# Patient Record
Sex: Female | Born: 1935 | Race: White | Hispanic: No | Marital: Married | Smoking: Former smoker
Health system: Southern US, Community
[De-identification: ages and names within clinical notes are randomized; demographics above are authoritative.]

## PROBLEM LIST (undated history)

## (undated) DIAGNOSIS — I10 Essential (primary) hypertension: Secondary | ICD-10-CM

## (undated) DIAGNOSIS — B379 Candidiasis, unspecified: Secondary | ICD-10-CM

## (undated) DIAGNOSIS — I509 Heart failure, unspecified: Secondary | ICD-10-CM

## (undated) DIAGNOSIS — E785 Hyperlipidemia, unspecified: Secondary | ICD-10-CM

## (undated) DIAGNOSIS — N952 Postmenopausal atrophic vaginitis: Secondary | ICD-10-CM

## (undated) DIAGNOSIS — I429 Cardiomyopathy, unspecified: Secondary | ICD-10-CM

## (undated) DIAGNOSIS — N183 Chronic kidney disease, stage 3 unspecified: Secondary | ICD-10-CM

## (undated) DIAGNOSIS — H409 Unspecified glaucoma: Secondary | ICD-10-CM

## (undated) DIAGNOSIS — M1990 Unspecified osteoarthritis, unspecified site: Secondary | ICD-10-CM

## (undated) DIAGNOSIS — E1129 Type 2 diabetes mellitus with other diabetic kidney complication: Secondary | ICD-10-CM

## (undated) DIAGNOSIS — H269 Unspecified cataract: Secondary | ICD-10-CM

## (undated) DIAGNOSIS — I447 Left bundle-branch block, unspecified: Secondary | ICD-10-CM

## (undated) HISTORY — DX: Unspecified osteoarthritis, unspecified site: M19.90

## (undated) HISTORY — DX: Hyperlipidemia, unspecified: E78.5

## (undated) HISTORY — DX: Postmenopausal atrophic vaginitis: N95.2

## (undated) HISTORY — PX: ABDOMINAL HYSTERECTOMY: SHX81

## (undated) HISTORY — DX: Unspecified glaucoma: H40.9

## (undated) HISTORY — DX: Unspecified cataract: H26.9

## (undated) HISTORY — PX: EYE SURGERY: SHX253

## (undated) HISTORY — DX: Candidiasis, unspecified: B37.9

## (undated) HISTORY — PX: OTHER SURGICAL HISTORY: SHX169

## (undated) HISTORY — PX: FINGER SURGERY: SHX640

---
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
## 1976-11-23 HISTORY — PX: TOTAL VAGINAL HYSTERECTOMY: SHX2548

## 1998-01-14 ENCOUNTER — Ambulatory Visit (HOSPITAL_COMMUNITY): Admission: RE | Admit: 1998-01-14 | Discharge: 1998-01-14 | Payer: Self-pay | Admitting: Obstetrics and Gynecology

## 1998-01-18 ENCOUNTER — Ambulatory Visit (HOSPITAL_COMMUNITY): Admission: RE | Admit: 1998-01-18 | Discharge: 1998-01-18 | Payer: Self-pay | Admitting: Obstetrics and Gynecology

## 1999-01-15 ENCOUNTER — Ambulatory Visit (HOSPITAL_COMMUNITY): Admission: RE | Admit: 1999-01-15 | Discharge: 1999-01-15 | Payer: Self-pay | Admitting: Obstetrics and Gynecology

## 1999-01-15 ENCOUNTER — Encounter: Payer: Self-pay | Admitting: Obstetrics and Gynecology

## 1999-02-17 ENCOUNTER — Encounter: Admission: RE | Admit: 1999-02-17 | Discharge: 1999-05-18 | Payer: Self-pay | Admitting: Endocrinology

## 2000-01-22 DIAGNOSIS — E1129 Type 2 diabetes mellitus with other diabetic kidney complication: Secondary | ICD-10-CM

## 2000-01-22 HISTORY — DX: Type 2 diabetes mellitus with other diabetic kidney complication: E11.29

## 2000-03-03 ENCOUNTER — Encounter: Payer: Self-pay | Admitting: Obstetrics and Gynecology

## 2000-03-03 ENCOUNTER — Ambulatory Visit (HOSPITAL_COMMUNITY): Admission: RE | Admit: 2000-03-03 | Discharge: 2000-03-03 | Payer: Self-pay | Admitting: Obstetrics and Gynecology

## 2001-04-07 ENCOUNTER — Other Ambulatory Visit: Admission: RE | Admit: 2001-04-07 | Discharge: 2001-04-07 | Payer: Self-pay | Admitting: Obstetrics and Gynecology

## 2001-04-14 ENCOUNTER — Ambulatory Visit (HOSPITAL_COMMUNITY): Admission: RE | Admit: 2001-04-14 | Discharge: 2001-04-14 | Payer: Self-pay | Admitting: Obstetrics and Gynecology

## 2001-04-14 ENCOUNTER — Encounter: Payer: Self-pay | Admitting: Obstetrics and Gynecology

## 2001-04-21 ENCOUNTER — Encounter: Payer: Self-pay | Admitting: Obstetrics and Gynecology

## 2001-04-21 ENCOUNTER — Encounter: Admission: RE | Admit: 2001-04-21 | Discharge: 2001-04-21 | Payer: Self-pay | Admitting: Obstetrics and Gynecology

## 2002-07-28 ENCOUNTER — Encounter: Admission: RE | Admit: 2002-07-28 | Discharge: 2002-07-28 | Payer: Self-pay | Admitting: Obstetrics and Gynecology

## 2002-07-28 ENCOUNTER — Encounter: Payer: Self-pay | Admitting: Obstetrics and Gynecology

## 2003-05-24 HISTORY — PX: COLONOSCOPY: SHX174

## 2003-06-08 ENCOUNTER — Ambulatory Visit (HOSPITAL_COMMUNITY): Admission: RE | Admit: 2003-06-08 | Discharge: 2003-06-08 | Payer: Self-pay | Admitting: *Deleted

## 2003-10-26 ENCOUNTER — Emergency Department (HOSPITAL_COMMUNITY): Admission: EM | Admit: 2003-10-26 | Discharge: 2003-10-27 | Payer: Self-pay

## 2003-11-09 ENCOUNTER — Encounter: Admission: RE | Admit: 2003-11-09 | Discharge: 2003-11-09 | Payer: Self-pay | Admitting: Obstetrics and Gynecology

## 2004-08-26 ENCOUNTER — Encounter (HOSPITAL_COMMUNITY): Admission: RE | Admit: 2004-08-26 | Discharge: 2004-11-24 | Payer: Self-pay | Admitting: Cardiology

## 2005-01-14 ENCOUNTER — Encounter: Admission: RE | Admit: 2005-01-14 | Discharge: 2005-01-14 | Payer: Self-pay | Admitting: Obstetrics and Gynecology

## 2006-03-02 ENCOUNTER — Other Ambulatory Visit: Admission: RE | Admit: 2006-03-02 | Discharge: 2006-03-02 | Payer: Self-pay | Admitting: Obstetrics & Gynecology

## 2006-03-02 LAB — HM PAP SMEAR: HM Pap smear: NEGATIVE

## 2006-03-03 ENCOUNTER — Encounter: Admission: RE | Admit: 2006-03-03 | Discharge: 2006-03-03 | Payer: Self-pay | Admitting: Obstetrics & Gynecology

## 2007-03-30 ENCOUNTER — Encounter: Admission: RE | Admit: 2007-03-30 | Discharge: 2007-03-30 | Payer: Self-pay | Admitting: Obstetrics & Gynecology

## 2008-01-22 LAB — HM DEXA SCAN

## 2008-04-04 ENCOUNTER — Encounter: Admission: RE | Admit: 2008-04-04 | Discharge: 2008-04-04 | Payer: Self-pay | Admitting: Obstetrics & Gynecology

## 2008-06-23 HISTORY — PX: COLONOSCOPY: SHX174

## 2008-07-13 ENCOUNTER — Ambulatory Visit (HOSPITAL_COMMUNITY): Admission: RE | Admit: 2008-07-13 | Discharge: 2008-07-13 | Payer: Self-pay | Admitting: *Deleted

## 2009-04-10 ENCOUNTER — Encounter: Admission: RE | Admit: 2009-04-10 | Discharge: 2009-04-10 | Payer: Self-pay | Admitting: Internal Medicine

## 2009-11-23 HISTORY — PX: CATARACT EXTRACTION W/ INTRAOCULAR LENS IMPLANT: SHX1309

## 2011-04-02 ENCOUNTER — Other Ambulatory Visit: Payer: Self-pay | Admitting: Internal Medicine

## 2011-04-02 DIAGNOSIS — Z1231 Encounter for screening mammogram for malignant neoplasm of breast: Secondary | ICD-10-CM

## 2011-04-07 NOTE — Op Note (Signed)
[name], Katie Fuentes          ACCOUNT NO.:  [id number]   MEDICAL RECORD NO.:  [id number]          PATIENT TYPE:  AMB   LOCATION:  ENDO                         FACILITY:  [facility]   PHYSICIAN:  Georgiana Spinner, M.D.    DATE OF BIRTH:  [birth year]   DATE OF PROCEDURE:  07/13/2008  DATE OF DISCHARGE:                               OPERATIVE REPORT   PROCEDURE:  Colonoscopy.   INDICATIONS:  Colon cancer screening, colon polyps.   ANESTHESIA:  1. Fentanyl 100 mcg.  2. Versed 10 mg.   PROCEDURE:  With the patient mildly sedated in the left lateral  decubitus position, the Pentax videoscopic colonoscope was inserted in  the rectum and passed under direct vision.  With pressure applied, we  reached the cecum, identified by ileocecal valve and appendiceal  orifice, both of which were photographed.  From this point, the  colonoscope was slowly withdrawn, taking circumferential views of the  colonic mucosa, stopping only in the rectum which appeared normal on  direct and showed hemorrhoids on retroflexed view.  The endoscope was  straightened and withdrawn.  The patient's vital signs and pulse  oximeter remained stable.  The patient tolerated the procedure well,  without apparent complications.   FINDINGS:  Internal hemorrhoids.  Otherwise, unremarkable exam.   PLAN:  Consider repeat examination in 5-10 years.           ______________________________  Georgiana Spinner, M.D.     GMO/MEDQ  D:  07/13/2008  T:  07/13/2008  Job:  [id number]

## 2011-04-08 ENCOUNTER — Ambulatory Visit: Payer: Self-pay

## 2011-04-10 NOTE — Op Note (Signed)
   NAME:  Katie Fuentes, Katie Fuentes                    ACCOUNT NO.:  [id number]   MEDICAL RECORD NO.:  [id number]                   PATIENT TYPE:  AMB   LOCATION:  ENDO                                 FACILITY:  [facility]   PHYSICIAN:  Georgiana Spinner, M.D.                 DATE OF BIRTH:  02-19-1934   DATE OF PROCEDURE:  06/08/2003  DATE OF DISCHARGE:                                 OPERATIVE REPORT   PROCEDURE:  Colonoscopy.   SURGEON:  Georgiana Spinner, M.D.   INDICATIONS FOR PROCEDURE:  Colon cancer screening.   ANESTHESIA:  Demerol 80 mg and Versed 8 mg.   DESCRIPTION OF PROCEDURE:  With the patient mildly sedated in the left  lateral decubitus position, the Olympus videoscopic colonoscope was inserted  in the rectum and passed under direct vision to the cecum identified by the  ileocecal valve and the base of the cecum both of which were photographed  from this point.  The colonoscope was slowly withdrawn taking several views  and the entire colonic mucosa was visualized, stopping only in the rectum  which appeared normal.  The rectum showed hemorrhoids in the retroflex view.  The endoscope was straightened and withdrawn.  The patient's vital signs and  pulse oximetry remained stable.  The patient tolerated the procedure well  without apparent complications.   FINDINGS:  Internal hemorrhoids, otherwise unremarkable colonoscopic  examination to the cecum.   RECOMMENDATIONS:  Repeat examination in five to ten years.                                               Georgiana Spinner, M.D.    GMO/MEDQ  D:  06/08/2003  T:  06/09/2003  Job:  [id number]

## 2011-04-17 ENCOUNTER — Ambulatory Visit
Admission: RE | Admit: 2011-04-17 | Discharge: 2011-04-17 | Disposition: A | Discharge status: Home / Self Care | Payer: Medicare Other | Source: Ambulatory Visit | Attending: Internal Medicine | Admitting: Internal Medicine

## 2011-04-17 DIAGNOSIS — Z1231 Encounter for screening mammogram for malignant neoplasm of breast: Secondary | ICD-10-CM

## 2012-04-11 ENCOUNTER — Other Ambulatory Visit: Payer: Self-pay | Admitting: Internal Medicine

## 2012-04-11 DIAGNOSIS — Z1231 Encounter for screening mammogram for malignant neoplasm of breast: Secondary | ICD-10-CM

## 2012-04-11 DIAGNOSIS — N63 Unspecified lump in unspecified breast: Secondary | ICD-10-CM

## 2012-04-15 ENCOUNTER — Other Ambulatory Visit: Payer: Self-pay | Admitting: Internal Medicine

## 2012-04-15 DIAGNOSIS — N63 Unspecified lump in unspecified breast: Secondary | ICD-10-CM

## 2012-04-20 ENCOUNTER — Ambulatory Visit
Admission: RE | Admit: 2012-04-20 | Discharge: 2012-04-20 | Disposition: A | Discharge status: Home / Self Care | Payer: Medicare Other | Source: Ambulatory Visit | Attending: Internal Medicine | Admitting: Internal Medicine

## 2012-04-20 DIAGNOSIS — N63 Unspecified lump in unspecified breast: Secondary | ICD-10-CM

## 2012-04-26 ENCOUNTER — Other Ambulatory Visit: Payer: Medicare Other

## 2012-04-29 ENCOUNTER — Ambulatory Visit: Payer: Medicare Other

## 2012-09-14 ENCOUNTER — Other Ambulatory Visit: Payer: Self-pay | Admitting: Internal Medicine

## 2012-09-14 DIAGNOSIS — R799 Abnormal finding of blood chemistry, unspecified: Secondary | ICD-10-CM

## 2012-09-21 ENCOUNTER — Ambulatory Visit
Admission: RE | Admit: 2012-09-21 | Discharge: 2012-09-21 | Disposition: A | Discharge status: Home / Self Care | Payer: Medicare Other | Source: Ambulatory Visit | Attending: Internal Medicine | Admitting: Internal Medicine

## 2012-09-21 DIAGNOSIS — R799 Abnormal finding of blood chemistry, unspecified: Secondary | ICD-10-CM

## 2013-05-02 ENCOUNTER — Encounter: Payer: Self-pay | Admitting: *Deleted

## 2013-05-05 ENCOUNTER — Other Ambulatory Visit: Payer: Self-pay

## 2013-05-05 DIAGNOSIS — Z1231 Encounter for screening mammogram for malignant neoplasm of breast: Secondary | ICD-10-CM

## 2013-05-09 ENCOUNTER — Encounter: Payer: Self-pay | Admitting: Nurse Practitioner

## 2013-05-09 ENCOUNTER — Ambulatory Visit (INDEPENDENT_AMBULATORY_CARE_PROVIDER_SITE_OTHER): Payer: Medicare Other | Admitting: Nurse Practitioner

## 2013-05-09 VITALS — BP 132/60 | HR 66 | Resp 14 | Ht 62.5 in | Wt 167.4 lb

## 2013-05-09 DIAGNOSIS — Z01419 Encounter for gynecological examination (general) (routine) without abnormal findings: Secondary | ICD-10-CM

## 2013-05-09 MED ORDER — ESTROGENS, CONJUGATED 0.625 MG/GM VA CREA: TOPICAL_CREAM | VAGINAL | Status: DC

## 2013-05-09 NOTE — Progress Notes (Signed)
79 y.o. G36P0013 Married Caucasian Fe here for annual exam.  She feels well, occasional urinary and stool incontinence whish is a long term problem. She has been told it is related to her med's for diabetes. She does not need to wear a pad daily.  No LMP recorded. Patient has had a hysterectomy.          Sexually active: yes  The current method of family planning is status post hysterectomy.    Exercising: yes  Pt does aerobics.  Smoker:  no  Health Maintenance: Pap:  03/02/2006  negative MMG:  04/20/2012 scheduled 05/2013 Colonoscopy:  06/2008  Dr. Virginia Rochester normal recheck in 5 years BMD:   2013 "normal " per patient TDaP:  2013, per pt. PCP maintains tetanus.  Labs: PCP maintains lab (blood work) and checks urine.    does not have a smoking history on file. She has never used smokeless tobacco. She reports that she does not drink alcohol or use illicit drugs.  Past Medical History  Diagnosis Date  . Atrophic vaginitis   . Yeast infection   . Diabetes mellitus without complication   . Glaucoma   . Cataracts, bilateral     Past Surgical History  Procedure Laterality Date  . Vaginal delivery      x3  . Colonoscopy  05/2003  . Finger surgery    . Abdominal hysterectomy      TVH    Current Outpatient Prescriptions  Medication Sig Dispense Refill  . Ascorbic Acid (VITAMIN C) 1000 MG tablet Take 1,000 mg by mouth daily.      Marland Kitchen aspirin 81 MG tablet Take 81 mg by mouth daily.      . beta carotene 82956 UNIT capsule Take 10,000 Units by mouth daily.      . calcium carbonate (TUMS EX) 750 MG chewable tablet Chew 1 tablet by mouth 2 (two) times daily.      . calcium citrate (CALCITRATE - DOSED IN MG ELEMENTAL CALCIUM) 950 MG tablet Take 1 tablet by mouth daily.      . cholecalciferol (VITAMIN D) 1000 UNITS tablet Take 1,000 Units by mouth daily.      Marland Kitchen conjugated estrogens (PREMARIN) vaginal cream Place vaginally daily.      Marland Kitchen glucosamine-chondroitin 500-400 MG tablet Take 1 tablet by mouth 3  (three) times daily.      Marland Kitchen glyBURIDE (DIABETA) 5 MG tablet Take 5 mg by mouth daily with breakfast.      . L-Methylfolate-B6-B12 (METANX PO) Take by mouth 2 (two) times daily.      . nadolol-bendroflumethiazide (CORZIDE) 40-5 MG per tablet Take 1 tablet by mouth daily.      . rosuvastatin (CRESTOR) 5 MG tablet Take 5 mg by mouth daily.      . sitaGLIPtan-metformin (JANUMET) 50-1000 MG per tablet Take 1 tablet by mouth 2 (two) times daily with a meal.      . telmisartan (MICARDIS) 80 MG tablet Take 80 mg by mouth daily.      . vitamin E 400 UNIT capsule Take 400 Units by mouth daily.      . Travoprost, BAK Free, (TRAVATAN) 0.004 % SOLN ophthalmic solution Place 1 drop into both eyes at bedtime.       No current facility-administered medications for this visit.    History reviewed. No pertinent family history.  ROS:  Pertinent items are noted in HPI.  Otherwise, a comprehensive ROS was negative.  Exam:   BP 132/60  Pulse 66  Resp 14  Ht 5' 2.5" (1.588 m)  Wt 167 lb 6.4 oz (75.932 kg)  BMI 30.11 kg/m2 Height: 5' 2.5" (158.8 cm)  Ht Readings from Last 3 Encounters:  05/09/13 5' 2.5" (1.588 m)    General appearance: alert, cooperative and appears stated age Head: Normocephalic, without obvious abnormality, atraumatic Neck: no adenopathy, supple, symmetrical, trachea midline and thyroid normal to inspection and palpation Lungs: clear to auscultation bilaterally Breasts: normal appearance, no masses or tenderness Heart: regular rate and rhythm Abdomen: soft, non-tender; no masses,  no organomegaly Extremities: extremities normal, atraumatic, no cyanosis or edema Skin: Skin color, texture, turgor normal. No rashes or lesions Lymph nodes: Cervical, supraclavicular, and axillary nodes normal. No abnormal inguinal nodes palpated Neurologic: Grossly normal   Pelvic: External genitalia:  no lesions              Urethra:  normal appearing urethra with no masses, tenderness or lesions               Bartholin's and Skene's: normal                 Vagina: atrophic appearing vagina but improved with pale color and no   discharge, no lesions              Cervix: absent              Pap taken: no Bimanual Exam:  Uterus:  uterus absent              Adnexa: no mass, fullness, tenderness               Rectovaginal: declined per pt request               Anus:  declined per pt request  A:  Well Woman with normal exam  S/P TVH secondary to endometriosis 1978  Atrophic vaginitis  better on E vag cream  History of DM, HTN, hypercholesterolemia  P:   Pap smear as per guidelines   Mammogram as scheduled 7/ 2014  Refilled vag E cream  counseled on osteoporosis, adequate intake of calcium and vitamin D,   diet and exercise, Kegel's exercises return annually or prn  An After Visit Summary was printed and given to the patient.

## 2013-05-09 NOTE — Patient Instructions (Signed)

## 2013-05-11 NOTE — Progress Notes (Signed)
Encounter reviewed by Dr. Brook Silva.  

## 2013-06-08 ENCOUNTER — Ambulatory Visit: Payer: Medicare Other

## 2013-06-28 ENCOUNTER — Other Ambulatory Visit: Payer: Self-pay

## 2013-06-28 ENCOUNTER — Ambulatory Visit
Admission: RE | Admit: 2013-06-28 | Discharge: 2013-06-28 | Disposition: A | Discharge status: Home / Self Care | Payer: Medicare Other | Source: Ambulatory Visit

## 2013-06-28 DIAGNOSIS — Z1231 Encounter for screening mammogram for malignant neoplasm of breast: Secondary | ICD-10-CM

## 2013-09-28 ENCOUNTER — Other Ambulatory Visit: Payer: Self-pay

## 2014-04-02 ENCOUNTER — Encounter: Payer: Self-pay | Admitting: Nurse Practitioner

## 2014-04-23 HISTORY — PX: SQUAMOUS CELL CARCINOMA EXCISION: SHX2433

## 2014-05-11 ENCOUNTER — Ambulatory Visit: Payer: Medicare Other | Admitting: Nurse Practitioner

## 2014-05-24 ENCOUNTER — Ambulatory Visit (INDEPENDENT_AMBULATORY_CARE_PROVIDER_SITE_OTHER): Payer: Medicare Other | Admitting: Nurse Practitioner

## 2014-05-24 ENCOUNTER — Encounter: Payer: Self-pay | Admitting: Nurse Practitioner

## 2014-05-24 VITALS — BP 130/64 | Resp 18 | Ht 62.5 in | Wt 166.0 lb

## 2014-05-24 DIAGNOSIS — E1142 Type 2 diabetes mellitus with diabetic polyneuropathy: Secondary | ICD-10-CM

## 2014-05-24 DIAGNOSIS — E1129 Type 2 diabetes mellitus with other diabetic kidney complication: Secondary | ICD-10-CM | POA: Insufficient documentation

## 2014-05-24 DIAGNOSIS — I10 Essential (primary) hypertension: Secondary | ICD-10-CM | POA: Insufficient documentation

## 2014-05-24 DIAGNOSIS — N952 Postmenopausal atrophic vaginitis: Secondary | ICD-10-CM

## 2014-05-24 DIAGNOSIS — Z01419 Encounter for gynecological examination (general) (routine) without abnormal findings: Secondary | ICD-10-CM

## 2014-05-24 DIAGNOSIS — E0840 Diabetes mellitus due to underlying condition with diabetic neuropathy, unspecified: Secondary | ICD-10-CM

## 2014-05-24 DIAGNOSIS — E1349 Other specified diabetes mellitus with other diabetic neurological complication: Secondary | ICD-10-CM

## 2014-05-24 DIAGNOSIS — E669 Obesity, unspecified: Secondary | ICD-10-CM

## 2014-05-24 MED ORDER — ESTROGENS, CONJUGATED 0.625 MG/GM VA CREA: TOPICAL_CREAM | VAGINAL | Status: DC

## 2014-05-24 NOTE — Progress Notes (Signed)
80 y.o. G12P0013 Married Caucasian Fe here for annual exam.  Last HGB AIC was 7.0,.  She is working on getting that down.  Doing well except 2 UTI in May and June. PCP thought it may be related to using premarin vaginal cream and getting it at the urethra. She was not SA at the last event of infection.   Patient's last menstrual period was 07/24/1977.          Sexually active: Yes.    The current method of family planning is status post hysterectomy.    Exercising: Yes.    aerobics, tai chi chah 3x/wk;4-5/wk Smoker:  no  Health Maintenance: Pap:  03/02/2006 Neg MMG:  06/28/13 Bi-Rads 1  Colonoscopy:  2009 Normal  BMD:   2014 osteopenia TDaP:  2014 Shingles vaccine 01/2014 Labs: Tommy Medal, MD    reports that she has quit smoking. Her smoking use included Cigarettes. She has a .2 pack-year smoking history. She has never used smokeless tobacco. She reports that she does not drink alcohol or use illicit drugs.  Past Medical History  Diagnosis Date  . Atrophic vaginitis   . Yeast infection   . Glaucoma   . Cataracts, bilateral   . Diabetes mellitus without complication 11/6107  . Hyperlipidemia     Past Surgical History  Procedure Laterality Date  . Vaginal delivery      x3  . Colonoscopy  05/2003  . Finger surgery    . Cataract extraction w/ intraocular lens implant Right 2011    1 year later the left side done  . Colonoscopy  06/2008    Dr. Lajoyce Corners recheck in 5 years  . Total vaginal hysterectomy  1978    for endometriosis  . Appendectomy  1950  . Squamous cell carcinoma excision  6/15    Nose    Current Outpatient Prescriptions  Medication Sig Dispense Refill  . ACCU-CHEK AVIVA PLUS test strip       . Ascorbic Acid (VITAMIN C) 1000 MG tablet Take 1,000 mg by mouth daily.      Marland Kitchen aspirin 81 MG tablet Take 81 mg by mouth daily.      . beta carotene 10000 UNIT capsule Take 10,000 Units by mouth daily.      . calcium citrate (CALCITRATE - DOSED IN MG ELEMENTAL CALCIUM) 950 MG  tablet Take 1 tablet by mouth daily.      . cholecalciferol (VITAMIN D) 1000 UNITS tablet Take 1,000 Units by mouth daily.      Marland Kitchen conjugated estrogens (PREMARIN) vaginal cream Place vaginally 2 (two) times a week.  42.5 g  3  . glucosamine-chondroitin 500-400 MG tablet Take 1 tablet by mouth 3 (three) times daily.      Marland Kitchen glyBURIDE (DIABETA) 5 MG tablet Take 5 mg by mouth daily with breakfast.      . L-Methylfolate-B6-B12 (METANX PO) Take by mouth 2 (two) times daily.      . nadolol-bendroflumethiazide (CORZIDE) 40-5 MG per tablet Take 1 tablet by mouth daily.      . Probiotic Product (PROBIOTIC DAILY PO) Take by mouth. Florastor      . rosuvastatin (CRESTOR) 5 MG tablet Take 5 mg by mouth daily.      . sitaGLIPtan-metformin (JANUMET) 50-1000 MG per tablet Take 1 tablet by mouth 2 (two) times daily with a meal.      . telmisartan (MICARDIS) 80 MG tablet Take 80 mg by mouth daily.      . Travoprost, BAK Free, (  TRAVATAN) 0.004 % SOLN ophthalmic solution Place 1 drop into both eyes at bedtime.      . vitamin E 400 UNIT capsule Take 400 Units by mouth daily.       No current facility-administered medications for this visit.    Family History  Problem Relation Age of Onset  . Heart disease  67    ROS:  Pertinent items are noted in HPI.  Otherwise, a comprehensive ROS was negative.  Exam:   BP 130/64  Resp 18  Ht 5' 2.5" (1.588 m)  Wt 166 lb (75.297 kg)  BMI 29.86 kg/m2  LMP 07/24/1977 Height: 5' 2.5" (158.8 cm)  Ht Readings from Last 3 Encounters:  05/24/14 5' 2.5" (1.588 m)  05/09/13 5' 2.5" (1.588 m)    General appearance: alert, cooperative and appears stated age Head: Normocephalic, without obvious abnormality, atraumatic Neck: no adenopathy, supple, symmetrical, trachea midline and thyroid normal to inspection and palpation Lungs: clear to auscultation bilaterally Breasts: normal appearance, no masses or tenderness Heart: regular rate and rhythm Abdomen: soft, non-tender; no  masses,  no organomegaly Extremities: extremities normal, atraumatic, no cyanosis or edema Skin: Skin color, texture, turgor normal. No rashes or lesions Lymph nodes: Cervical, supraclavicular, and axillary nodes normal. No abnormal inguinal nodes palpated Neurologic: Grossly normal   Pelvic: External genitalia:  no lesions              Urethra:  normal appearing urethra with no masses, tenderness or lesions, no prolapse              Bartholin's and Skene's: normal                 Vagina: slight atrophic appearing vagina with normal color and discharge, no lesions              Cervix: absent              Pap taken: No. Bimanual Exam:  Uterus:  uterus absent              Adnexa: no mass, fullness, tenderness               Rectovaginal: Confirms               Anus:  normal sphincter tone, no lesions  A:  Well Woman with normal exam  S/P TVH 1978 secondary to endometriosis  Atrophic vaginitis  Recent UTI  History of DM with peripheral neuropathy  P:   Reviewed health and wellness pertinent to exam  Pap smear not taken today  Mammogram is due 8/15  Counseled about using vaginal estrogen cream at the urethra is safe and in fact helps to prevent urethral prolapse and decrease numbers of UTI  Refill on Premarin vaginal cream for a year  Counseled with risk of DVT, CVA, cancer, etc.  Counseled on breast self exam, mammography screening, osteoporosis, adequate intake of calcium and vitamin D, diet and exercise, Kegel's exercises return annually or prn  An After Visit Summary was printed and given to the patient.

## 2014-05-24 NOTE — Patient Instructions (Addendum)

## 2014-05-29 NOTE — Progress Notes (Signed)
Encounter reviewed by Dr. Deen Deguia Silva.  

## 2014-09-24 ENCOUNTER — Encounter: Payer: Self-pay | Admitting: Nurse Practitioner

## 2014-12-05 DIAGNOSIS — D1801 Hemangioma of skin and subcutaneous tissue: Secondary | ICD-10-CM | POA: Diagnosis not present

## 2014-12-05 DIAGNOSIS — L821 Other seborrheic keratosis: Secondary | ICD-10-CM | POA: Diagnosis not present

## 2014-12-05 DIAGNOSIS — Z85828 Personal history of other malignant neoplasm of skin: Secondary | ICD-10-CM | POA: Diagnosis not present

## 2014-12-05 DIAGNOSIS — H61001 Unspecified perichondritis of right external ear: Secondary | ICD-10-CM | POA: Diagnosis not present

## 2014-12-05 DIAGNOSIS — L570 Actinic keratosis: Secondary | ICD-10-CM | POA: Diagnosis not present

## 2014-12-05 DIAGNOSIS — D485 Neoplasm of uncertain behavior of skin: Secondary | ICD-10-CM | POA: Diagnosis not present

## 2014-12-06 DIAGNOSIS — N390 Urinary tract infection, site not specified: Secondary | ICD-10-CM | POA: Diagnosis not present

## 2014-12-10 DIAGNOSIS — N289 Disorder of kidney and ureter, unspecified: Secondary | ICD-10-CM | POA: Diagnosis not present

## 2014-12-10 DIAGNOSIS — E1165 Type 2 diabetes mellitus with hyperglycemia: Secondary | ICD-10-CM | POA: Diagnosis not present

## 2014-12-11 DIAGNOSIS — N289 Disorder of kidney and ureter, unspecified: Secondary | ICD-10-CM | POA: Diagnosis not present

## 2014-12-13 DIAGNOSIS — E1122 Type 2 diabetes mellitus with diabetic chronic kidney disease: Secondary | ICD-10-CM | POA: Diagnosis not present

## 2014-12-13 DIAGNOSIS — N289 Disorder of kidney and ureter, unspecified: Secondary | ICD-10-CM | POA: Diagnosis not present

## 2014-12-13 DIAGNOSIS — I10 Essential (primary) hypertension: Secondary | ICD-10-CM | POA: Diagnosis not present

## 2015-01-04 DIAGNOSIS — N390 Urinary tract infection, site not specified: Secondary | ICD-10-CM | POA: Diagnosis not present

## 2015-01-23 DIAGNOSIS — R300 Dysuria: Secondary | ICD-10-CM | POA: Diagnosis not present

## 2015-01-23 DIAGNOSIS — N390 Urinary tract infection, site not specified: Secondary | ICD-10-CM | POA: Diagnosis not present

## 2015-01-25 DIAGNOSIS — N390 Urinary tract infection, site not specified: Secondary | ICD-10-CM | POA: Diagnosis not present

## 2015-02-01 DIAGNOSIS — N390 Urinary tract infection, site not specified: Secondary | ICD-10-CM | POA: Diagnosis not present

## 2015-02-08 DIAGNOSIS — N390 Urinary tract infection, site not specified: Secondary | ICD-10-CM | POA: Diagnosis not present

## 2015-02-08 DIAGNOSIS — R300 Dysuria: Secondary | ICD-10-CM | POA: Diagnosis not present

## 2015-02-14 ENCOUNTER — Encounter (HOSPITAL_COMMUNITY): Payer: Self-pay | Admitting: Emergency Medicine

## 2015-02-14 ENCOUNTER — Inpatient Hospital Stay (HOSPITAL_COMMUNITY)
Admission: EM | Admit: 2015-02-14 | Discharge: 2015-02-18 | DRG: 280 | Disposition: A | Discharge status: Home / Self Care | Payer: Medicare Other | Attending: Internal Medicine | Admitting: Internal Medicine

## 2015-02-14 ENCOUNTER — Emergency Department (HOSPITAL_COMMUNITY): Payer: Medicare Other

## 2015-02-14 DIAGNOSIS — D509 Iron deficiency anemia, unspecified: Secondary | ICD-10-CM | POA: Diagnosis not present

## 2015-02-14 DIAGNOSIS — N261 Atrophy of kidney (terminal): Secondary | ICD-10-CM | POA: Diagnosis not present

## 2015-02-14 DIAGNOSIS — I214 Non-ST elevation (NSTEMI) myocardial infarction: Secondary | ICD-10-CM | POA: Diagnosis present

## 2015-02-14 DIAGNOSIS — E785 Hyperlipidemia, unspecified: Secondary | ICD-10-CM | POA: Diagnosis present

## 2015-02-14 DIAGNOSIS — R0602 Shortness of breath: Secondary | ICD-10-CM

## 2015-02-14 DIAGNOSIS — Z7982 Long term (current) use of aspirin: Secondary | ICD-10-CM

## 2015-02-14 DIAGNOSIS — R778 Other specified abnormalities of plasma proteins: Secondary | ICD-10-CM | POA: Diagnosis not present

## 2015-02-14 DIAGNOSIS — N19 Unspecified kidney failure: Secondary | ICD-10-CM | POA: Diagnosis not present

## 2015-02-14 DIAGNOSIS — Z9841 Cataract extraction status, right eye: Secondary | ICD-10-CM

## 2015-02-14 DIAGNOSIS — I10 Essential (primary) hypertension: Secondary | ICD-10-CM | POA: Diagnosis not present

## 2015-02-14 DIAGNOSIS — I340 Nonrheumatic mitral (valve) insufficiency: Secondary | ICD-10-CM | POA: Diagnosis not present

## 2015-02-14 DIAGNOSIS — J9601 Acute respiratory failure with hypoxia: Secondary | ICD-10-CM | POA: Diagnosis present

## 2015-02-14 DIAGNOSIS — Z87891 Personal history of nicotine dependence: Secondary | ICD-10-CM

## 2015-02-14 DIAGNOSIS — Z882 Allergy status to sulfonamides status: Secondary | ICD-10-CM | POA: Diagnosis not present

## 2015-02-14 DIAGNOSIS — R7989 Other specified abnormal findings of blood chemistry: Secondary | ICD-10-CM | POA: Diagnosis not present

## 2015-02-14 DIAGNOSIS — I509 Heart failure, unspecified: Secondary | ICD-10-CM | POA: Diagnosis not present

## 2015-02-14 DIAGNOSIS — J810 Acute pulmonary edema: Secondary | ICD-10-CM

## 2015-02-14 DIAGNOSIS — I429 Cardiomyopathy, unspecified: Secondary | ICD-10-CM | POA: Diagnosis not present

## 2015-02-14 DIAGNOSIS — I519 Heart disease, unspecified: Secondary | ICD-10-CM | POA: Diagnosis not present

## 2015-02-14 DIAGNOSIS — D649 Anemia, unspecified: Secondary | ICD-10-CM | POA: Diagnosis not present

## 2015-02-14 DIAGNOSIS — Z9842 Cataract extraction status, left eye: Secondary | ICD-10-CM | POA: Diagnosis not present

## 2015-02-14 DIAGNOSIS — D72829 Elevated white blood cell count, unspecified: Secondary | ICD-10-CM | POA: Diagnosis not present

## 2015-02-14 DIAGNOSIS — R069 Unspecified abnormalities of breathing: Secondary | ICD-10-CM | POA: Diagnosis not present

## 2015-02-14 DIAGNOSIS — I160 Hypertensive urgency: Secondary | ICD-10-CM | POA: Diagnosis present

## 2015-02-14 DIAGNOSIS — J811 Chronic pulmonary edema: Secondary | ICD-10-CM | POA: Diagnosis not present

## 2015-02-14 DIAGNOSIS — N179 Acute kidney failure, unspecified: Secondary | ICD-10-CM | POA: Diagnosis present

## 2015-02-14 DIAGNOSIS — I130 Hypertensive heart and chronic kidney disease with heart failure and stage 1 through stage 4 chronic kidney disease, or unspecified chronic kidney disease: Secondary | ICD-10-CM | POA: Diagnosis not present

## 2015-02-14 DIAGNOSIS — I447 Left bundle-branch block, unspecified: Secondary | ICD-10-CM | POA: Diagnosis not present

## 2015-02-14 DIAGNOSIS — N184 Chronic kidney disease, stage 4 (severe): Secondary | ICD-10-CM | POA: Diagnosis not present

## 2015-02-14 DIAGNOSIS — J90 Pleural effusion, not elsewhere classified: Secondary | ICD-10-CM | POA: Diagnosis not present

## 2015-02-14 DIAGNOSIS — Z888 Allergy status to other drugs, medicaments and biological substances status: Secondary | ICD-10-CM

## 2015-02-14 DIAGNOSIS — E1165 Type 2 diabetes mellitus with hyperglycemia: Secondary | ICD-10-CM | POA: Diagnosis present

## 2015-02-14 DIAGNOSIS — I5041 Acute combined systolic (congestive) and diastolic (congestive) heart failure: Principal | ICD-10-CM | POA: Diagnosis present

## 2015-02-14 DIAGNOSIS — Q211 Atrial septal defect: Secondary | ICD-10-CM | POA: Diagnosis not present

## 2015-02-14 DIAGNOSIS — Z961 Presence of intraocular lens: Secondary | ICD-10-CM | POA: Diagnosis present

## 2015-02-14 DIAGNOSIS — I248 Other forms of acute ischemic heart disease: Secondary | ICD-10-CM | POA: Diagnosis not present

## 2015-02-14 DIAGNOSIS — I501 Left ventricular failure: Secondary | ICD-10-CM | POA: Diagnosis not present

## 2015-02-14 DIAGNOSIS — R0603 Acute respiratory distress: Secondary | ICD-10-CM

## 2015-02-14 DIAGNOSIS — I5021 Acute systolic (congestive) heart failure: Secondary | ICD-10-CM | POA: Diagnosis not present

## 2015-02-14 HISTORY — DX: Essential (primary) hypertension: I10

## 2015-02-14 LAB — CBC WITH DIFFERENTIAL/PLATELET
BASOS ABS: 0.1 10*3/uL (ref 0.0–0.1)
BASOS PCT: 0 % (ref 0–1)
Basophils Absolute: 0 10*3/uL (ref 0.0–0.1)
Basophils Relative: 1 % (ref 0–1)
Eosinophils Absolute: 0.4 10*3/uL (ref 0.0–0.7)
Eosinophils Absolute: 0 10*3/uL (ref 0.0–0.7)
Eosinophils Relative: 3 % (ref 0–5)
Eosinophils Relative: 0 % (ref 0–5)
HEMATOCRIT: 31.7 % — AB (ref 36.0–46.0)
HEMATOCRIT: 28.6 % — AB (ref 36.0–46.0)
HEMOGLOBIN: 10.1 g/dL — AB (ref 12.0–15.0)
HEMOGLOBIN: 9.2 g/dL — AB (ref 12.0–15.0)
LYMPHS ABS: 2.5 10*3/uL (ref 0.7–4.0)
LYMPHS ABS: 0.7 10*3/uL (ref 0.7–4.0)
Lymphocytes Relative: 17 % (ref 12–46)
Lymphocytes Relative: 6 % — ABNORMAL LOW (ref 12–46)
MCH: 27.7 pg (ref 26.0–34.0)
MCH: 27.6 pg (ref 26.0–34.0)
MCHC: 31.9 g/dL (ref 30.0–36.0)
MCHC: 32.2 g/dL (ref 30.0–36.0)
MCV: 86.8 fL (ref 78.0–100.0)
MCV: 85.9 fL (ref 78.0–100.0)
MONOS PCT: 6 % (ref 3–12)
Monocytes Absolute: 0.9 10*3/uL (ref 0.1–1.0)
Monocytes Absolute: 0.6 10*3/uL (ref 0.1–1.0)
Monocytes Relative: 5 % (ref 3–12)
NEUTROS ABS: 10.6 10*3/uL — AB (ref 1.7–7.7)
NEUTROS PCT: 89 % — AB (ref 43–77)
Neutro Abs: 11 10*3/uL — ABNORMAL HIGH (ref 1.7–7.7)
Neutrophils Relative %: 73 % (ref 43–77)
Platelets: 466 10*3/uL — ABNORMAL HIGH (ref 150–400)
Platelets: 374 10*3/uL (ref 150–400)
RBC: 3.65 MIL/uL — AB (ref 3.87–5.11)
RBC: 3.33 MIL/uL — ABNORMAL LOW (ref 3.87–5.11)
RDW: 13.6 % (ref 11.5–15.5)
RDW: 13.6 % (ref 11.5–15.5)
WBC: 14.8 10*3/uL — ABNORMAL HIGH (ref 4.0–10.5)
WBC: 11.8 10*3/uL — ABNORMAL HIGH (ref 4.0–10.5)

## 2015-02-14 LAB — GLUCOSE, CAPILLARY
GLUCOSE-CAPILLARY: 137 mg/dL — AB (ref 70–99)
GLUCOSE-CAPILLARY: 123 mg/dL — AB (ref 70–99)
Glucose-Capillary: 231 mg/dL — ABNORMAL HIGH (ref 70–99)
Glucose-Capillary: 173 mg/dL — ABNORMAL HIGH (ref 70–99)
Glucose-Capillary: 119 mg/dL — ABNORMAL HIGH (ref 70–99)

## 2015-02-14 LAB — COMPREHENSIVE METABOLIC PANEL
ALBUMIN: 3.6 g/dL (ref 3.5–5.2)
ALBUMIN: 3.3 g/dL — AB (ref 3.5–5.2)
ALK PHOS: 50 U/L (ref 39–117)
ALT: 24 U/L (ref 0–35)
ALT: 22 U/L (ref 0–35)
AST: 29 U/L (ref 0–37)
AST: 30 U/L (ref 0–37)
Alkaline Phosphatase: 58 U/L (ref 39–117)
Anion gap: 15 (ref 5–15)
Anion gap: 12 (ref 5–15)
BUN: 51 mg/dL — ABNORMAL HIGH (ref 6–23)
BUN: 55 mg/dL — ABNORMAL HIGH (ref 6–23)
CALCIUM: 8.6 mg/dL (ref 8.4–10.5)
CHLORIDE: 100 mmol/L (ref 96–112)
CO2: 22 mmol/L (ref 19–32)
CO2: 22 mmol/L (ref 19–32)
CREATININE: 1.72 mg/dL — AB (ref 0.50–1.10)
Calcium: 8.5 mg/dL (ref 8.4–10.5)
Chloride: 96 mmol/L (ref 96–112)
Creatinine, Ser: 1.71 mg/dL — ABNORMAL HIGH (ref 0.50–1.10)
GFR calc Af Amer: 31 mL/min — ABNORMAL LOW (ref >90)
GFR calc Af Amer: 32 mL/min — ABNORMAL LOW (ref >90)
GFR calc non Af Amer: 27 mL/min — ABNORMAL LOW (ref >90)
GFR calc non Af Amer: 27 mL/min — ABNORMAL LOW (ref >90)
Glucose, Bld: 348 mg/dL — ABNORMAL HIGH (ref 70–99)
Glucose, Bld: 218 mg/dL — ABNORMAL HIGH (ref 70–99)
POTASSIUM: 3.7 mmol/L (ref 3.5–5.1)
Potassium: 4 mmol/L (ref 3.5–5.1)
SODIUM: 134 mmol/L — AB (ref 135–145)
Sodium: 133 mmol/L — ABNORMAL LOW (ref 135–145)
TOTAL PROTEIN: 6.7 g/dL (ref 6.0–8.3)
Total Bilirubin: 0.6 mg/dL (ref 0.3–1.2)
Total Bilirubin: 0.4 mg/dL (ref 0.3–1.2)
Total Protein: 7.3 g/dL (ref 6.0–8.3)

## 2015-02-14 LAB — I-STAT TROPONIN, ED: Troponin i, poc: 0.05 ng/mL (ref 0.00–0.08)

## 2015-02-14 LAB — TROPONIN I
TROPONIN I: 1.18 ng/mL — AB (ref <0.031)
Troponin I: 0.57 ng/mL (ref <0.031)
Troponin I: 1.16 ng/mL (ref <0.031)

## 2015-02-14 LAB — BLOOD GAS, ARTERIAL
Acid-base deficit: 2.1 mmol/L — ABNORMAL HIGH (ref 0.0–2.0)
BICARBONATE: 22.7 meq/L (ref 20.0–24.0)
Drawn by: 36989
O2 SAT: 94.1 %
PATIENT TEMPERATURE: 98.6
TCO2: 24 mmol/L (ref 0–100)
pCO2 arterial: 42.5 mmHg (ref 35.0–45.0)
pH, Arterial: 7.346 — ABNORMAL LOW (ref 7.350–7.450)
pO2, Arterial: 80 mmHg (ref 80.0–100.0)

## 2015-02-14 LAB — I-STAT ARTERIAL BLOOD GAS, ED
Acid-base deficit: 3 mmol/L — ABNORMAL HIGH (ref 0.0–2.0)
Bicarbonate: 25 mEq/L — ABNORMAL HIGH (ref 20.0–24.0)
O2 Saturation: 100 %
PCO2 ART: 58.2 mmHg — AB (ref 35.0–45.0)
PH ART: 7.242 — AB (ref 7.350–7.450)
TCO2: 27 mmol/L (ref 0–100)
pO2, Arterial: 252 mmHg — ABNORMAL HIGH (ref 80.0–100.0)

## 2015-02-14 LAB — URINALYSIS, ROUTINE W REFLEX MICROSCOPIC
Bilirubin Urine: NEGATIVE
Glucose, UA: 100 mg/dL — AB
Hgb urine dipstick: NEGATIVE
KETONES UR: NEGATIVE mg/dL
Leukocytes, UA: NEGATIVE
Nitrite: NEGATIVE
PROTEIN: NEGATIVE mg/dL
SPECIFIC GRAVITY, URINE: 1.012 (ref 1.005–1.030)
UROBILINOGEN UA: 0.2 mg/dL (ref 0.0–1.0)
pH: 5 (ref 5.0–8.0)

## 2015-02-14 LAB — HEPARIN LEVEL (UNFRACTIONATED): HEPARIN UNFRACTIONATED: 0.44 [IU]/mL (ref 0.30–0.70)

## 2015-02-14 LAB — BRAIN NATRIURETIC PEPTIDE: B Natriuretic Peptide: 1346.9 pg/mL — ABNORMAL HIGH (ref 0.0–100.0)

## 2015-02-14 LAB — I-STAT CHEM 8, ED
BUN: 49 mg/dL — AB (ref 6–23)
CHLORIDE: 98 mmol/L (ref 96–112)
CREATININE: 1.6 mg/dL — AB (ref 0.50–1.10)
Calcium, Ion: 1.08 mmol/L — ABNORMAL LOW (ref 1.13–1.30)
GLUCOSE: 355 mg/dL — AB (ref 70–99)
HEMATOCRIT: 35 % — AB (ref 36.0–46.0)
Hemoglobin: 11.9 g/dL — ABNORMAL LOW (ref 12.0–15.0)
POTASSIUM: 4 mmol/L (ref 3.5–5.1)
Sodium: 135 mmol/L (ref 135–145)
TCO2: 20 mmol/L (ref 0–100)

## 2015-02-14 LAB — I-STAT CG4 LACTIC ACID, ED: Lactic Acid, Venous: 2.69 mmol/L (ref 0.5–2.0)

## 2015-02-14 LAB — PROTIME-INR
INR: 1.11 (ref 0.00–1.49)
PROTHROMBIN TIME: 14.4 s (ref 11.6–15.2)

## 2015-02-14 LAB — LACTIC ACID, PLASMA
Lactic Acid, Venous: 2.1 mmol/L (ref 0.5–2.0)
Lactic Acid, Venous: 0.7 mmol/L (ref 0.5–2.0)

## 2015-02-14 LAB — TSH: TSH: 2.035 u[IU]/mL (ref 0.350–4.500)

## 2015-02-14 LAB — LIPASE, BLOOD: Lipase: 59 U/L (ref 11–59)

## 2015-02-14 LAB — MRSA PCR SCREENING: MRSA BY PCR: NEGATIVE

## 2015-02-14 MED ORDER — ATORVASTATIN CALCIUM 10 MG PO TABS
10.0000 mg | ORAL_TABLET | Freq: Every day | ORAL | Status: DC
  Filled 2015-02-14: qty 1

## 2015-02-14 MED ORDER — NITROGLYCERIN IN D5W 200-5 MCG/ML-% IV SOLN
100.0000 ug/min | INTRAVENOUS | Status: DC
  Administered 2015-02-14: 10 ug/min via INTRAVENOUS
  Filled 2015-02-14: qty 250

## 2015-02-14 MED ORDER — ACETAMINOPHEN 325 MG PO TABS
650.0000 mg | ORAL_TABLET | Freq: Four times a day (QID) | ORAL | Status: DC | PRN
Start: 2015-02-14 — End: 2015-02-18
  Administered 2015-02-14 – 2015-02-16 (×3): 650 mg via ORAL
  Filled 2015-02-14 (×4): qty 2

## 2015-02-14 MED ORDER — ONDANSETRON HCL 4 MG/2ML IJ SOLN: 4.0000 mg | Freq: Four times a day (QID) | INTRAMUSCULAR | Status: DC | PRN

## 2015-02-14 MED ORDER — HEPARIN (PORCINE) IN NACL 100-0.45 UNIT/ML-% IJ SOLN
850.0000 [IU]/h | INTRAMUSCULAR | Status: DC
  Administered 2015-02-14: 850 [IU]/h via INTRAVENOUS
  Filled 2015-02-14: qty 250

## 2015-02-14 MED ORDER — NADOLOL 40 MG PO TABS
40.0000 mg | ORAL_TABLET | Freq: Every day | ORAL | Status: DC
  Administered 2015-02-14 – 2015-02-16 (×3): 40 mg via ORAL
  Filled 2015-02-14 (×4): qty 1

## 2015-02-14 MED ORDER — FUROSEMIDE 10 MG/ML IJ SOLN
20.0000 mg | Freq: Once | INTRAMUSCULAR | Status: AC
  Administered 2015-02-14: 20 mg via INTRAVENOUS
  Filled 2015-02-14: qty 2

## 2015-02-14 MED ORDER — NITROGLYCERIN IN D5W 200-5 MCG/ML-% IV SOLN
INTRAVENOUS | Status: AC
  Administered 2015-02-14: 10 ug
  Filled 2015-02-14: qty 250

## 2015-02-14 MED ORDER — LATANOPROST 0.005 % OP SOLN
1.0000 [drp] | Freq: Every day | OPHTHALMIC | Status: DC
  Administered 2015-02-14 – 2015-02-17 (×4): 1 [drp] via OPHTHALMIC
  Filled 2015-02-14: qty 2.5

## 2015-02-14 MED ORDER — NADOLOL-BENDROFLUMETHIAZIDE 40-5 MG PO TABS: 1.0000 | ORAL_TABLET | Freq: Every day | ORAL | Status: DC

## 2015-02-14 MED ORDER — IRBESARTAN 300 MG PO TABS
300.0000 mg | ORAL_TABLET | Freq: Every day | ORAL | Status: DC
  Administered 2015-02-14: 300 mg via ORAL
  Filled 2015-02-14: qty 1

## 2015-02-14 MED ORDER — INSULIN ASPART 100 UNIT/ML ~~LOC~~ SOLN
0.0000 [IU] | Freq: Three times a day (TID) | SUBCUTANEOUS | Status: DC
  Administered 2015-02-14 (×2): 1 [IU] via SUBCUTANEOUS
  Administered 2015-02-14 – 2015-02-15 (×2): 2 [IU] via SUBCUTANEOUS
  Administered 2015-02-15: 1 [IU] via SUBCUTANEOUS
  Administered 2015-02-16: 5 [IU] via SUBCUTANEOUS
  Administered 2015-02-16 – 2015-02-17 (×2): 2 [IU] via SUBCUTANEOUS
  Administered 2015-02-17: 5 [IU] via SUBCUTANEOUS
  Administered 2015-02-17: 2 [IU] via SUBCUTANEOUS
  Administered 2015-02-18: 3 [IU] via SUBCUTANEOUS
  Administered 2015-02-18: 2 [IU] via SUBCUTANEOUS

## 2015-02-14 MED ORDER — ACETAMINOPHEN 650 MG RE SUPP
650.0000 mg | Freq: Four times a day (QID) | RECTAL | Status: DC | PRN
Start: 2015-02-14 — End: 2015-02-18

## 2015-02-14 MED ORDER — GLYBURIDE 5 MG PO TABS
5.0000 mg | ORAL_TABLET | Freq: Two times a day (BID) | ORAL | Status: DC
  Administered 2015-02-14: 5 mg via ORAL
  Filled 2015-02-14 (×3): qty 1

## 2015-02-14 MED ORDER — HYDROCHLOROTHIAZIDE 25 MG PO TABS
25.0000 mg | ORAL_TABLET | Freq: Every day | ORAL | Status: DC
  Administered 2015-02-14: 25 mg via ORAL
  Filled 2015-02-14: qty 1

## 2015-02-14 MED ORDER — ENOXAPARIN SODIUM 30 MG/0.3ML ~~LOC~~ SOLN
30.0000 mg | Freq: Every day | SUBCUTANEOUS | Status: DC
  Administered 2015-02-14: 30 mg via SUBCUTANEOUS
  Filled 2015-02-14 (×2): qty 0.3

## 2015-02-14 MED ORDER — LINAGLIPTIN 5 MG PO TABS
5.0000 mg | ORAL_TABLET | Freq: Every day | ORAL | Status: DC
  Administered 2015-02-14: 5 mg via ORAL
  Filled 2015-02-14 (×2): qty 1

## 2015-02-14 MED ORDER — FUROSEMIDE 10 MG/ML IJ SOLN
40.0000 mg | Freq: Once | INTRAMUSCULAR | Status: AC
  Administered 2015-02-14: 40 mg via INTRAVENOUS
  Filled 2015-02-14: qty 4

## 2015-02-14 MED ORDER — FUROSEMIDE 10 MG/ML IJ SOLN
40.0000 mg | Freq: Every day | INTRAMUSCULAR | Status: DC
  Administered 2015-02-15: 40 mg via INTRAVENOUS
  Filled 2015-02-14: qty 4

## 2015-02-14 MED ORDER — ATORVASTATIN CALCIUM 10 MG PO TABS
10.0000 mg | ORAL_TABLET | Freq: Every day | ORAL | Status: DC
  Administered 2015-02-14 – 2015-02-17 (×4): 10 mg via ORAL
  Filled 2015-02-14 (×5): qty 1

## 2015-02-14 MED ORDER — ASPIRIN EC 325 MG PO TBEC
325.0000 mg | DELAYED_RELEASE_TABLET | Freq: Every day | ORAL | Status: DC
  Administered 2015-02-14 – 2015-02-15 (×2): 325 mg via ORAL
  Filled 2015-02-14 (×2): qty 1

## 2015-02-14 MED ORDER — ONDANSETRON HCL 4 MG PO TABS: 4.0000 mg | ORAL_TABLET | Freq: Four times a day (QID) | ORAL | Status: DC | PRN

## 2015-02-14 MED ORDER — ENALAPRILAT 1.25 MG/ML IV SOLN
1.2500 mg | Freq: Once | INTRAVENOUS | Status: AC
  Administered 2015-02-14: 1.25 mg via INTRAVENOUS
  Filled 2015-02-14: qty 1

## 2015-02-14 NOTE — Progress Notes (Signed)
CRITICAL VALUE ALERT  Critical value received:  Lactid Acid  Date of notification:  02/14/2015  Time of notification:  0900  Critical value read back:Yes.    Nurse who received alert: Lester Kinsman RN  MD notified (1st page):  Dr. Maryland Pink  Time of first page:  0905  MD notified (2nd page):  Time of second page:  Responding MD: Dr. Maryland Pink  Time MD responded:  1000

## 2015-02-14 NOTE — ED Notes (Signed)
MD at bedside. 

## 2015-02-14 NOTE — ED Notes (Signed)
EMS/PT report increasing SOB over the last 2 days. Pt states she has had to sleep propped up d/t SOB when laying flat. Pt came in ED on CPAP and sats dropped to 65% during the time it took to switch pt to ED bipap machine. Pt a/o x 4 on arrival to ED. Pt has 3+ pitting edema in LE.

## 2015-02-14 NOTE — ED Provider Notes (Signed)
CSN: [id number]     Arrival date & time 02/14/15  0210 History   First MD Initiated Contact with Patient 02/14/15 0214     This chart was scribed for Katie Balls, MD by Forrestine Him, ED Scribe. This patient was seen in room [facility] and the patient's care was started 2:15 AM.   No chief complaint on file.  HPI  LEVEL 5 CAVEAT DUE TO CONDITION  HPI Comments: Katie Fuentes brought in by EMS is a 81 y.o. female with a PMHx of DM and hyperlipidemia who presents to the Emergency Department complaining of constant, ongoing shortness of breath onset last night. However, symptoms have worsened today. Per EMS, pts husband states she looks more pale than usual this evening. New onset weakness reported. Upon arrival, pt was given 4 Zofran along with 3 doses of Nitro. Pt is not currently on any fluid pills. She is not followed by a cardiologist at this time.  Past Medical History  Diagnosis Date  . Atrophic vaginitis   . Yeast infection   . Glaucoma   . Cataracts, bilateral   . Diabetes mellitus without complication 04/3334  . Hyperlipidemia    Past Surgical History  Procedure Laterality Date  . Vaginal delivery      x3  . Colonoscopy  05/2003  . Finger surgery    . Cataract extraction w/ intraocular lens implant Right 2011    1 year later the left side done  . Colonoscopy  06/2008    Dr. Lajoyce Corners recheck in 5 years  . Total vaginal hysterectomy  1978    for endometriosis  . Appendectomy  1950  . Squamous cell carcinoma excision  6/15    Nose   Family History  Problem Relation Age of Onset  . Heart disease  67   History  Substance Use Topics  . Smoking status: Former Smoker -- 0.10 packs/day for 2 years    Types: Cigarettes  . Smokeless tobacco: Never Used  . Alcohol Use: No   OB History    Gravida Para Term Preterm AB TAB SAB Ectopic Multiple Living   4 3   1     3      Review of Systems  Unable to perform ROS: Acuity of condition   Allergies  Alphagan; Azopt;  Cefdinir; Macrobid; and Sulfa antibiotics  Home Medications   Prior to Admission medications   Medication Sig Start Date End Date Taking? Authorizing Provider  ACCU-CHEK AVIVA PLUS test strip  04/27/13   Historical Provider, MD  Ascorbic Acid (VITAMIN C) 1000 MG tablet Take 1,000 mg by mouth daily.    Historical Provider, MD  aspirin 81 MG tablet Take 81 mg by mouth daily.    Historical Provider, MD  beta carotene 10000 UNIT capsule Take 10,000 Units by mouth daily.    Historical Provider, MD  calcium citrate (CALCITRATE - DOSED IN MG ELEMENTAL CALCIUM) 950 MG tablet Take 1 tablet by mouth daily.    Historical Provider, MD  cholecalciferol (VITAMIN D) 1000 UNITS tablet Take 1,000 Units by mouth daily.    Historical Provider, MD  conjugated estrogens (PREMARIN) vaginal cream Place vaginally 2 (two) times a week. 05/24/14   [name], FNP  glucosamine-chondroitin 500-400 MG tablet Take 1 tablet by mouth 3 (three) times daily.    Historical Provider, MD  glyBURIDE (DIABETA) 5 MG tablet Take 5 mg by mouth daily with breakfast.    Historical Provider, MD  L-Methylfolate-B6-B12 (METANX PO) Take by mouth 2 (  two) times daily.    Historical Provider, MD  nadolol-bendroflumethiazide (CORZIDE) 40-5 MG per tablet Take 1 tablet by mouth daily.    Historical Provider, MD  Probiotic Product (PROBIOTIC DAILY PO) Take by mouth. Florastor    Historical Provider, MD  rosuvastatin (CRESTOR) 5 MG tablet Take 5 mg by mouth daily.    Historical Provider, MD  sitaGLIPtan-metformin (JANUMET) 50-1000 MG per tablet Take 1 tablet by mouth 2 (two) times daily with a meal.    Historical Provider, MD  telmisartan (MICARDIS) 80 MG tablet Take 80 mg by mouth daily.    Historical Provider, MD  Travoprost, BAK Free, (TRAVATAN) 0.004 % SOLN ophthalmic solution Place 1 drop into both eyes at bedtime.    Historical Provider, MD  vitamin E 400 UNIT capsule Take 400 Units by mouth daily.    Historical Provider, MD   Triage  Vitals: BP 202/91 mmHg  Pulse 100  Temp(Src) 97.6 F (36.4 C) (Axillary)  Resp 23  Ht 5\' 3"  (1.6 m)  Wt 165 lb (74.844 kg)  BMI 29.24 kg/m2  SpO2 96%  LMP 07/24/1977   Physical Exam  Constitutional: She is oriented to person, place, and time. She appears well-developed and well-nourished. She appears distressed.  BIPAP mask in place  HENT:  Head: Normocephalic and atraumatic.  Eyes: EOM are normal.  Neck: Normal range of motion. JVD present.  Cardiovascular: Regular rhythm and normal heart sounds.  Tachycardia present.   Pulmonary/Chest: She has rales.  Tachypnic Increased work of breathing Rales throughout both lung fields  Abdominal: Soft. She exhibits no distension. There is no tenderness.  Musculoskeletal: Normal range of motion. She exhibits edema.  Bilateral lower extremity edema   Neurological: She is alert and oriented to person, place, and time.  Skin: Skin is warm and dry.  Psychiatric: She has a normal mood and affect. Judgment normal.  Nursing note and vitals reviewed.   ED Course  Procedures (including critical care time)  DIAGNOSTIC STUDIES: Oxygen Saturation is 96% on RA, adequate by my interpretation.    COORDINATION OF CARE: 2:19 AM-Discussed treatment plan with pt at bedside and pt agreed to plan.     Labs Review Labs Reviewed  CBC WITH DIFFERENTIAL/PLATELET - Abnormal; Notable for the following:    WBC 14.8 (*)    RBC 3.65 (*)    Hemoglobin 10.1 (*)    HCT 31.7 (*)    Platelets 466 (*)    Neutro Abs 11.0 (*)    All other components within normal limits  COMPREHENSIVE METABOLIC PANEL - Abnormal; Notable for the following:    Sodium 133 (*)    Glucose, Bld 348 (*)    BUN 51 (*)    Creatinine, Ser 1.72 (*)    GFR calc non Af Amer 27 (*)    GFR calc Af Amer 31 (*)    All other components within normal limits  I-STAT CHEM 8, ED - Abnormal; Notable for the following:    BUN 49 (*)    Creatinine, Ser 1.60 (*)    Glucose, Bld 355 (*)     Calcium, Ion 1.08 (*)    Hemoglobin 11.9 (*)    HCT 35.0 (*)    All other components within normal limits  I-STAT CG4 LACTIC ACID, ED - Abnormal; Notable for the following:    Lactic Acid, Venous 2.69 (*)    All other components within normal limits  LIPASE, BLOOD  PROTIME-INR  BLOOD GAS, ARTERIAL  BRAIN NATRIURETIC PEPTIDE  Randolm Idol, ED    Imaging Review Dg Chest Port 1 View  02/14/2015   CLINICAL DATA:  Shortness of breath.  EXAM: PORTABLE CHEST - 1 VIEW  COMPARISON:  None.  FINDINGS: The heart is at the upper limits of normal. There is vascular congestion and mild pulmonary edema. No confluent airspace disease. Questionable blunting of both costophrenic angles. No pneumothorax. No acute osseous abnormality.  IMPRESSION: Vascular congestion and pulmonary edema, may reflect CHF. Questionable blunting of the costophrenic angles, may reflect small effusions.   Electronically Signed   By: Jeb Levering M.D.   On: 02/14/2015 03:07     EKG Interpretation   Date/Time:  Thursday February 14 2015 02:15:47 EDT Ventricular Rate:  100 PR Interval:  175 QRS Duration: 150 QT Interval:  420 QTC Calculation: 542 R Axis:   9 Text Interpretation:  Sinus tachycardia Anterior infarct, old Confirmed by  Glynn Octave [phone]) on 02/14/2015 3:04:12 AM      MDM   Final diagnoses:  None     patient presents emergency department for severe shortness of breath. She was initially pale and severely hypoxic before being placed on Cipro by EMS. O2 sats were initially 65%. In the emergency department before switching the patient to BiPAP. Her history is consistent with flash pulmonary edema. Bedside ultrasound confirms this. Patient was started on a nitro drip and given enalapril IV. She denies any history of heart failure or COPD. Patient is in severe respiratory distress at this time. She was admitted to the stepdown unit for continued management.  Emergency Ultrasound: Limited  Thoracic Performed and interpreted by Dr Claudine Mouton Longitudinal view of anterior left and right lung fields in real-time with linear probe. Indication: SOB Findings: + lung sliding + B lines Interpretation: no evidence of pneumothorax. Images electronically archived.       CRITICAL CARE Performed by: Katie Fuentes   Total critical care time: 5min resp distress  Critical care time was exclusive of separately billable procedures and treating other patients.  Critical care was necessary to treat or prevent imminent or life-threatening deterioration.  Critical care was time spent personally by me on the following activities: development of treatment plan with patient and/or surrogate as well as nursing, discussions with consultants, evaluation of patient's response to treatment, examination of patient, obtaining history from patient or surrogate, ordering and performing treatments and interventions, ordering and review of laboratory studies, ordering and review of radiographic studies, pulse oximetry and re-evaluation of patient's condition.    I personally performed the services described in this documentation, which was scribed in my presence. The recorded information has been reviewed and is accurate.   Katie Balls, MD 02/14/15 [phone]

## 2015-02-14 NOTE — Progress Notes (Signed)
CRITICAL VALUE ALERT  Critical value received:  Troponin 0.57  Date of notification:  02/14/2015  Time of notification:  9450  Critical value read back:Yes.    Nurse who received alert:  Lester Kinsman RN   MD notified (1st page):  Dr.Krishnan  Time of first page:  0905  MD notified (2nd page):  Time of second page:  Responding MD: Dr. Maryland Pink  Time MD responded: 1000

## 2015-02-14 NOTE — Progress Notes (Signed)
ANTICOAGULATION CONSULT NOTE - Initial Consult  Pharmacy Consult for heparin Indication: chest pain/ACS  Allergies  Allergen Reactions  . Alphagan [Brimonidine] Other (See Comments)    Redness in the eyes  . Azopt [Brinzolamide]     Redness in the eyes  . Cefdinir Diarrhea  . Macrobid [Nitrofurantoin Macrocrystal] Nausea And Vomiting    Fever, Chills  . Sulfa Antibiotics Nausea And Vomiting and Other (See Comments)    Fever,chills    Patient Measurements: Height: 5\' 3"  (160 cm) Weight: 165 lb (74.844 kg) IBW/kg (Calculated) : 52.4 Heparin Dosing Weight: 68 kg  Vital Signs: Temp: 98.8 F (37.1 C) (03/24 0828) Temp Source: Oral (03/24 0828) BP: 101/72 mmHg (03/24 0828) Pulse Rate: 87 (03/24 0828)  Labs:  Recent Labs  02/14/15 0217 02/14/15 0228 02/14/15 0705  HGB 10.1* 11.9* 9.2*  HCT 31.7* 35.0* 28.6*  PLT 466*  --  374  LABPROT 14.4  --   --   INR 1.11  --   --   CREATININE 1.72* 1.60* 1.71*  TROPONINI  --   --  0.57*    Estimated Creatinine Clearance: 25.9 mL/min (by C-G formula based on Cr of 1.71).   Medical History: Past Medical History  Diagnosis Date  . Atrophic vaginitis   . Yeast infection   . Glaucoma   . Cataracts, bilateral   . Diabetes mellitus without complication 0/3474  . Hyperlipidemia   . Hypertension     Medications:  Prescriptions prior to admission  Medication Sig Dispense Refill Last Dose  . Ascorbic Acid (VITAMIN C) 1000 MG tablet Take 1,000 mg by mouth daily.   02/13/2015 at Unknown time  . aspirin 81 MG tablet Take 81 mg by mouth daily.   02/13/2015 at Unknown time  . atorvastatin (LIPITOR) 10 MG tablet Take 10 mg by mouth daily.   02/13/2015 at Unknown time  . beta carotene 10000 UNIT capsule Take 10,000 Units by mouth daily.   02/13/2015 at Unknown time  . calcium citrate (CALCITRATE - DOSED IN MG ELEMENTAL CALCIUM) 950 MG tablet Take 1 tablet by mouth daily.   02/13/2015 at Unknown time  . cholecalciferol (VITAMIN D) 1000  UNITS tablet Take 1,000 Units by mouth daily.   02/13/2015 at Unknown time  . conjugated estrogens (PREMARIN) vaginal cream Place vaginally 2 (two) times a week. 42.5 g 3 unknown at Unknown time  . glucosamine-chondroitin 500-400 MG tablet Take 1 tablet by mouth 3 (three) times daily.   02/13/2015 at Unknown time  . glyBURIDE (DIABETA) 5 MG tablet Take 5 mg by mouth 2 (two) times daily with a meal.    02/13/2015 at Unknown time  . L-Methylfolate-B6-B12 (METANX PO) Take by mouth 2 (two) times daily.   02/13/2015 at Unknown time  . nadolol-bendroflumethiazide (CORZIDE) 40-5 MG per tablet Take 1 tablet by mouth daily.   02/13/2015 at Unknown time  . Probiotic Product (PROBIOTIC DAILY PO) Take by mouth. Florastor   02/13/2015 at Unknown time  . sitaGLIPtan-metformin (JANUMET) 50-1000 MG per tablet Take 1 tablet by mouth 2 (two) times daily with a meal.   02/13/2015 at Unknown time  . telmisartan (MICARDIS) 80 MG tablet Take 80 mg by mouth daily.   02/13/2015 at Unknown time  . Travoprost, BAK Free, (TRAVATAN) 0.004 % SOLN ophthalmic solution Place 1 drop into both eyes at bedtime.   02/13/2015 at Unknown time  . vitamin E 400 UNIT capsule Take 400 Units by mouth daily.   02/13/2015 at Unknown time  .  ACCU-CHEK AVIVA PLUS test strip    Taking    Assessment: 81 y/o female who presented to the ED with severe SOB and admitted for CHF exacerbation and hypertensive urgency. Trop came back positive this morning. Pharmacy consulted to begin IV heparin. SCr is elevated at 1.71, unknown baseline. Hb is low at 9.2, platelets are normal. Patient received Lovenox 30 mg SQ at 09:51.  Goal of Therapy:  Heparin level 0.3-0.7 units/ml Monitor platelets by anticoagulation protocol: Yes   Plan:  - Heparin drip at 850 units/hr with no bolus - 8 hr heparin level - Daily heparin level and CBC - Monitor for s/sx of bleeding  [facility], Tullos.D., BCPS Clinical Pharmacist Pager: [phone] 02/14/2015 11:21 AM

## 2015-02-14 NOTE — H&P (Signed)
[facility] History and Physical  Katie Fuentes BLT:[id number] DOB: 1934-02-09 DOA: 02/14/2015  Referring physician: ER physician. PCP: Katie Medal, MD   Chief Complaint: Shortness of breath.  HPI: Katie Fuentes is a 81 y.o. female with a history of hypertension, hyperlipidemia, diabetes mellitus type 2 presents to the ER because of worsening shortness of breath. Patient states that her shortness of breath started 2 nights ago. Patient states she has been finding it increasingly difficult lying flat and also climbing stairs. Patient had noticed some swelling in her legs. Denies any chest pain fever chills or productive cough. Since her shortness of breath got worse acutely patient came to the ER. In the ER patient's blood pressure was found to be more than 007 systolic with chest x-ray showing congestion. Patient was started on nitroglycerin infusion and a small dose of Lasix IV was given. Patient also was given IV enalapril. Patient was initially placed on BiPAP. Patient's blood pressure improved and her symptoms improved. Patient otherwise denies any nausea vomiting abdominal pain diarrhea headache visual symptoms.  Review of Systems: As presented in the history of presenting illness, rest negative.  Past Medical History  Diagnosis Date  . Atrophic vaginitis   . Yeast infection   . Glaucoma   . Cataracts, bilateral   . Diabetes mellitus without complication 04/2262  . Hyperlipidemia   . Hypertension    Past Surgical History  Procedure Laterality Date  . Vaginal delivery      x3  . Colonoscopy  05/2003  . Finger surgery    . Cataract extraction w/ intraocular lens implant Right 2011    1 year later the left side done  . Colonoscopy  06/2008    Dr. Lajoyce Corners recheck in 5 years  . Total vaginal hysterectomy  1978    for endometriosis  . Appendectomy  1950  . Squamous cell carcinoma excision  6/15    Nose   Social History:  reports that she has quit smoking. Her  smoking use included Cigarettes. She has a .2 pack-year smoking history. She has never used smokeless tobacco. She reports that she does not drink alcohol or use illicit drugs. Where does patient live home. Can patient participate in ADLs? Yes.  Allergies  Allergen Reactions  . Alphagan [Brimonidine] Other (See Comments)    Redness in the eyes  . Azopt [Brinzolamide]     Redness in the eyes  . Cefdinir Diarrhea  . Macrobid [Nitrofurantoin Macrocrystal] Nausea And Vomiting    Fever, Chills  . Sulfa Antibiotics Nausea And Vomiting and Other (See Comments)    Fever,chills    Family History:  Family History  Problem Relation Age of Onset  . Heart disease  67  . CAD Brother   . Hypertension Brother       Prior to Admission medications   Medication Sig Start Date End Date Taking? Authorizing Provider  Ascorbic Acid (VITAMIN C) 1000 MG tablet Take 1,000 mg by mouth daily.   Yes Historical Provider, MD  aspirin 81 MG tablet Take 81 mg by mouth daily.   Yes Historical Provider, MD  atorvastatin (LIPITOR) 10 MG tablet Take 10 mg by mouth daily.   Yes Historical Provider, MD  beta carotene 10000 UNIT capsule Take 10,000 Units by mouth daily.   Yes Historical Provider, MD  calcium citrate (CALCITRATE - DOSED IN MG ELEMENTAL CALCIUM) 950 MG tablet Take 1 tablet by mouth daily.   Yes Historical Provider, MD  cholecalciferol (VITAMIN D) 1000 UNITS tablet  Take 1,000 Units by mouth daily.   Yes Historical Provider, MD  conjugated estrogens (PREMARIN) vaginal cream Place vaginally 2 (two) times a week. 05/24/14  Yes [name], FNP  glucosamine-chondroitin 500-400 MG tablet Take 1 tablet by mouth 3 (three) times daily.   Yes Historical Provider, MD  glyBURIDE (DIABETA) 5 MG tablet Take 5 mg by mouth 2 (two) times daily with a meal.    Yes Historical Provider, MD  L-Methylfolate-B6-B12 (METANX PO) Take by mouth 2 (two) times daily.   Yes Historical Provider, MD  nadolol-bendroflumethiazide  (CORZIDE) 40-5 MG per tablet Take 1 tablet by mouth daily.   Yes Historical Provider, MD  Probiotic Product (PROBIOTIC DAILY PO) Take by mouth. Florastor   Yes Historical Provider, MD  sitaGLIPtan-metformin (JANUMET) 50-1000 MG per tablet Take 1 tablet by mouth 2 (two) times daily with a meal.   Yes Historical Provider, MD  telmisartan (MICARDIS) 80 MG tablet Take 80 mg by mouth daily.   Yes Historical Provider, MD  Travoprost, BAK Free, (TRAVATAN) 0.004 % SOLN ophthalmic solution Place 1 drop into both eyes at bedtime.   Yes Historical Provider, MD  vitamin E 400 UNIT capsule Take 400 Units by mouth daily.   Yes Historical Provider, MD  ACCU-CHEK AVIVA PLUS test strip  04/27/13   Historical Provider, MD    Physical Exam: Filed Vitals:   02/14/15 0315 02/14/15 0321 02/14/15 0330 02/14/15 0345  BP: 178/77 171/74 163/67 158/72  Pulse: 97 98 97 97  Temp:      TempSrc:      Resp: 28 30 28 30   Height:      Weight:      SpO2: 100% 100% 98% 100%     General:  Moderately built and nourished.  Eyes: Anicteric no pallor.  ENT: No discharge from the ears eyes nose and mouth.  Neck: Elevated JVD. No mass felt.  Cardiovascular: S1-S2 heard.  Respiratory: No rhonchi or crepitations.  Abdomen: Soft nontender bowel sounds present.  Skin: No rash.  Musculoskeletal: No edema.  Psychiatric: Appears normal.  Neurologic: Alert awake oriented to time place and person. Moves all extremities.  Labs on Admission:  Basic Metabolic Panel:  Recent Labs Lab 02/14/15 0217 02/14/15 0228  NA 133* 135  K 4.0 4.0  CL 96 98  CO2 22  --   GLUCOSE 348* 355*  BUN 51* 49*  CREATININE 1.72* 1.60*  CALCIUM 8.6  --    Liver Function Tests:  Recent Labs Lab 02/14/15 0217  AST 29  ALT 24  ALKPHOS 58  BILITOT 0.6  PROT 7.3  ALBUMIN 3.6    Recent Labs Lab 02/14/15 0217  LIPASE 59   No results for input(s): AMMONIA in the last 168 hours. CBC:  Recent Labs Lab 02/14/15 0217  02/14/15 0228  WBC 14.8*  --   NEUTROABS 11.0*  --   HGB 10.1* 11.9*  HCT 31.7* 35.0*  MCV 86.8  --   PLT 466*  --    Cardiac Enzymes: No results for input(s): CKTOTAL, CKMB, CKMBINDEX, TROPONINI in the last 168 hours.  BNP (last 3 results)  Recent Labs  02/14/15 0247  BNP 1346.9*    ProBNP (last 3 results) No results for input(s): PROBNP in the last 8760 hours.  CBG: No results for input(s): GLUCAP in the last 168 hours.  Radiological Exams on Admission: Dg Chest Port 1 View  02/14/2015   CLINICAL DATA:  Shortness of breath.  EXAM: PORTABLE CHEST - 1  VIEW  COMPARISON:  None.  FINDINGS: The heart is at the upper limits of normal. There is vascular congestion and mild pulmonary edema. No confluent airspace disease. Questionable blunting of both costophrenic angles. No pneumothorax. No acute osseous abnormality.  IMPRESSION: Vascular congestion and pulmonary edema, may reflect CHF. Questionable blunting of the costophrenic angles, may reflect small effusions.   Electronically Signed   By: Jeb Levering M.D.   On: 02/14/2015 03:07    EKG: Independently reviewed. Sinus rhythm with LBBB atypical. No old EKG to compare.  Assessment/Plan Principal Problem:   Acute respiratory failure with hypoxia Active Problems:   Hypertensive urgency   Diabetes mellitus type 2, uncontrolled   Normocytic anemia   Hyperlipidemia   Renal failure   1. Acute respiratory failure with hypoxia secondary to decompensated CHF - unknown EF. At this time we will continue with IV nitroglycerin infusion. Try to taper off infusion once patient started to take her home medications. Cycle cardiac markers. Check 2-D echo.Follow intake output and metabolic panel and daily weights. Aspirin. Patient is on beta blockers and statins and ARB. Patient did receive 1 dose of Lasix 20 mg IV in the ER and I have ordered one more dose. 2. Hypertensive urgency - probably causing #1. Patient is on IV nitroglycerin  infusion. Resume home medications. See #1. Closely follow blood pressure trends. Patient states she did not miss her home medication. 3. Diabetes mellitus type 2 uncontrolled - check hemoglobin A1c. Patient states her sugars are well controlled usually at home. Hold metformin due to renal failure. I have placed patient on sliding scale coverage. 4. Renal failure probably chronic given that patient also has anemia - baseline creatinine not known. If patient's creatinine further worsens may need to hold ARB. 5. Hyperlipidemia on statins. 6. Normocytic normochromic anemia - follow CBC. Check anemia panel. 7. Leukocytosis probably reactionary - patient is afebrile. Closely follow.  Since patient's initial ABG was showing respiratory acidosis I am ordering a repeat one.   DVT Prophylaxis Lovenox.  Code Status: Full code.  Family Communication: Patient's husband at the bedside.  Disposition Plan: [id number].    Ryett Hamman N. [facility] Pager [phone].  If 7PM-7AM, please contact night-coverage [email or web address] Password [facility] 02/14/2015, 5:07 AM

## 2015-02-14 NOTE — Progress Notes (Signed)
[place] for heparin Indication: chest pain/ACS  Allergies  Allergen Reactions  . Alphagan [Brimonidine] Other (See Comments)    Redness in the eyes  . Azopt [Brinzolamide]     Redness in the eyes  . Cefdinir Diarrhea  . Macrobid [Nitrofurantoin Macrocrystal] Nausea And Vomiting    Fever, Chills  . Sulfa Antibiotics Nausea And Vomiting and Other (See Comments)    Fever,chills    Patient Measurements: Height: 5\' 3"  (160 cm) Weight: 165 lb (74.844 kg) IBW/kg (Calculated) : 52.4 Heparin Dosing Weight: 68 kg  Vital Signs: Temp: 98 F (36.7 C) (03/24 1930) Temp Source: Oral (03/24 1930) BP: 124/52 mmHg (03/24 1930) Pulse Rate: 81 (03/24 1930)  Labs:  Recent Labs  02/14/15 0217 02/14/15 0228 02/14/15 0705 02/14/15 1118 02/14/15 1637 02/14/15 2100  HGB 10.1* 11.9* 9.2*  --   --   --   HCT 31.7* 35.0* 28.6*  --   --   --   PLT 466*  --  374  --   --   --   LABPROT 14.4  --   --   --   --   --   INR 1.11  --   --   --   --   --   HEPARINUNFRC  --   --   --   --   --  0.44  CREATININE 1.72* 1.60* 1.71*  --   --   --   TROPONINI  --   --  0.57* 1.16* 1.18*  --     Estimated Creatinine Clearance: 25.9 mL/min (by C-G formula based on Cr of 1.71).   Assessment: 81 y/o female who presented to the ED with severe SOB and admitted for CHF exacerbation and hypertensive urgency. Trop came back positive this morning. Pharmacy consulted to begin IV heparin. SCr is elevated at 1.71, unknown baseline. Hb is low at 9.2, platelets are normal. Patient received Lovenox 30 mg SQ at 09:51.  Initial heparin level = 0.44  Goal of Therapy:  Heparin level 0.3-0.7 units/ml Monitor platelets by anticoagulation protocol: Yes   Plan:  - Continue Heparin drip at 850 units/hr - Daily heparin level and CBC - Monitor for s/sx of bleeding  Thank you. Anette Guarneri, PharmD [phone]  02/14/2015 9:48 PM

## 2015-02-14 NOTE — Progress Notes (Signed)
[facility] PROGRESS NOTE  Katie Fuentes NID:[id number] DOB: 05/14/1934 DOA: 02/14/2015  PCP: Tommy Medal, MD  Brief HPI: 81 year old Caucasian female with a past medical history of hypertension, hyperlipidemia, diabetes, presented with worsening shortness of breath. According to the patient and her husband this started quite suddenly. She also noticed some swelling in her legs. She was thought to have acute congestive heart failure and was admitted to the hospital for further management. She was initially placed on BiPAP.  Past medical history:  Past Medical History  Diagnosis Date  . Atrophic vaginitis   . Yeast infection   . Glaucoma   . Cataracts, bilateral   . Diabetes mellitus without complication 11/4429  . Hyperlipidemia   . Hypertension     Consultants: Cardiology  Procedures:  2-D echocardiogram is pending  Antibiotics: None  Subjective: Patient feels better this morning. Shortness of breath is much improved. Denies any chest pain. No nausea, vomiting.  Objective: Vital Signs  Filed Vitals:   02/14/15 0600 02/14/15 0630 02/14/15 0828 02/14/15 1140  BP: 124/55 122/56 101/72 127/47  Pulse: 79 84 87 78  Temp:   98.8 F (37.1 C) 98.2 F (36.8 C)  TempSrc:   Oral Oral  Resp: 19 18 24 22   Height:      Weight:      SpO2: 96% 94% 96% 99%    Intake/Output Summary (Last 24 hours) at 02/14/15 1246 Last data filed at 02/14/15 1141  Gross per 24 hour  Intake  69.45 ml  Output   1150 ml  Net -1080.55 ml   Filed Weights   02/14/15 0221  Weight: 74.844 kg (165 lb)    General appearance: alert, cooperative, appears stated age and no distress Resp: Crackles noted bilateral bases. No wheezing. No rhonchi. Cardio: regular rate and rhythm, S1, S2 normal, no murmur, click, rub or gallop GI: soft, non-tender; bowel sounds normal; no masses,  no organomegaly Extremities: Pedal edema is noted bilaterally Neurologic: Alert and oriented 3. No focal  neurological deficits are noted.  Lab Results:  Basic Metabolic Panel:  Recent Labs Lab 02/14/15 0217 02/14/15 0228 02/14/15 0705  NA 133* 135 134*  K 4.0 4.0 3.7  CL 96 98 100  CO2 22  --  22  GLUCOSE 348* 355* 218*  BUN 51* 49* 55*  CREATININE 1.72* 1.60* 1.71*  CALCIUM 8.6  --  8.5   Liver Function Tests:  Recent Labs Lab 02/14/15 0217 02/14/15 0705  AST 29 30  ALT 24 22  ALKPHOS 58 50  BILITOT 0.6 0.4  PROT 7.3 6.7  ALBUMIN 3.6 3.3*    Recent Labs Lab 02/14/15 0217  LIPASE 59   CBC:  Recent Labs Lab 02/14/15 0217 02/14/15 0228 02/14/15 0705  WBC 14.8*  --  11.8*  NEUTROABS 11.0*  --  10.6*  HGB 10.1* 11.9* 9.2*  HCT 31.7* 35.0* 28.6*  MCV 86.8  --  85.9  PLT 466*  --  374   Cardiac Enzymes:  Recent Labs Lab 02/14/15 0705  TROPONINI 0.57*   BNP (last 3 results)  Recent Labs  02/14/15 0247  BNP 1346.9*   CBG:  Recent Labs Lab 02/14/15 0513 02/14/15 0824 02/14/15 1138  GLUCAP 231* 173* 137*    Recent Results (from the past 240 hour(s))  MRSA PCR Screening     Status: None   Collection Time: 02/14/15  4:29 AM  Result Value Ref Range Status   MRSA by PCR NEGATIVE NEGATIVE Final  Comment:        The GeneXpert MRSA Assay (FDA approved for NASAL specimens only), is one component of a comprehensive MRSA colonization surveillance program. It is not intended to diagnose MRSA infection nor to guide or monitor treatment for MRSA infections.       Studies/Results: Dg Chest Port 1 View  02/14/2015   CLINICAL DATA:  Shortness of breath.  EXAM: PORTABLE CHEST - 1 VIEW  COMPARISON:  None.  FINDINGS: The heart is at the upper limits of normal. There is vascular congestion and mild pulmonary edema. No confluent airspace disease. Questionable blunting of both costophrenic angles. No pneumothorax. No acute osseous abnormality.  IMPRESSION: Vascular congestion and pulmonary edema, may reflect CHF. Questionable blunting of the  costophrenic angles, may reflect small effusions.   Electronically Signed   By: Jeb Levering M.D.   On: 02/14/2015 03:07    Medications:  Scheduled: . aspirin EC  325 mg Oral Daily  . atorvastatin  10 mg Oral q1800  . furosemide  40 mg Intravenous Once  . [START ON 02/15/2015] furosemide  40 mg Intravenous Daily  . insulin aspart  0-9 Units Subcutaneous TID WC  . latanoprost  1 drop Both Eyes QHS  . linagliptin  5 mg Oral Daily  . nadolol  40 mg Oral Daily   Continuous: . heparin    . nitroGLYCERIN 80 mcg/min (02/14/15 0316)   SMO:[id number] **OR** acetaminophen, ondansetron **OR** ondansetron (ZOFRAN) IV  Assessment/Plan:  Principal Problem:   Acute respiratory failure with hypoxia Active Problems:   Hypertensive urgency   Diabetes mellitus type 2, uncontrolled   Normocytic anemia   Hyperlipidemia   Renal failure    Acute respiratory failure with hypoxia secondary to decompensated CHF - unknown EF Patient is improved after receiving Lasix. Continue Lasix intravenously. 2D echocardiogram is pending. Strict ins and outs and daily weights. Hold the ARB for now due to elevated creatinine. She is stable off of BiPAP. Repeat ABG shows improvement respiratory acidosis.  Possible non-ST elevation MI Troponin is mildly elevated. EKG is abnormal. No older EKGs available for comparison. She is on aspirin. We will start her on intravenous heparin. Cardiology will be consulted as patient will require further testing. Continue her beta blocker. Could consider taking her off of NTG soon. Repeat EKG.  Hypertensive urgency Blood pressure was in the 007H to 219 systolic at the time of admission. Improved with intravenous nitroglycerin. Continue to monitor for now.  Diabetes mellitus type 2 uncontrolled Poorly controlled. HbA1c is pending. Continue sliding scale coverage for now. May need to add basal insulin.  Renal failure probably chronic Baseline creatinine not known. Hold her  ARB for now. Monitor urine output.  Hyperlipidemia Continue statins.  Normocytic normochromic anemia Anemia panel is pending. Continue to monitor hemoglobin. No overt bleeding.  Leukocytosis Probably reactionary. Patient is afebrile. Closely follow.  DVT Prophylaxis: On IV heparin. Stop her Lovenox.    Code Status: Full code  Family Communication: Discussed with the patient and her husband  Disposition Plan: Not ready for discharge.    LOS: 0 days   [facility] Pager [phone] 02/14/2015, 12:46 PM  If 7PM-7AM, please contact night-coverage at [email or web address], password [facility]

## 2015-02-14 NOTE — Consult Note (Addendum)
Admit date: 02/14/2015 Referring Physician  Dr. [place] Pink Primary Physician  Dr. Tommy Medal Primary Cardiologist  None Reason for Consultation  Elevated troponin  HPI:  Katie Fuentes is a 81 y.o. female with a history of hypertension, hyperlipidemia, diabetes mellitus type 2 presents to the ER because of worsening shortness of breath. Patient states that her shortness of breath started 2 nights ago. Patient states she has been finding it increasingly difficult lying flat and also climbing stairs. Patient had noticed some swelling in her legs. Denies any chest pain fever chills or productive cough. Since her shortness of breath got worse acutely patient came to the ER. In the ER patient's blood pressure was found to be more than 564 systolic with chest x-ray showing congestion. Patient was started on nitroglycerin infusion and a small dose of Lasix IV was given. Patient also was given IV enalapril. Patient was initially placed on BiPAP. Patient's blood pressure improved and her symptoms improved. BNP was elevated at 1346 c/w acute CHF.  Initial troponin is elevated at 0.57 and Cardiology is asked to evaluate further.  EKG shows NSR with LBBB with no old EKG to compare.     PMH:   Past Medical History  Diagnosis Date  . Atrophic vaginitis   . Yeast infection   . Glaucoma   . Cataracts, bilateral   . Diabetes mellitus without complication 01/3294  . Hyperlipidemia   . Hypertension      PSH:   Past Surgical History  Procedure Laterality Date  . Vaginal delivery      x3  . Colonoscopy  05/2003  . Finger surgery    . Cataract extraction w/ intraocular lens implant Right 2011    1 year later the left side done  . Colonoscopy  06/2008    Dr. Lajoyce Corners recheck in 5 years  . Total vaginal hysterectomy  1978    for endometriosis  . Appendectomy  1950  . Squamous cell carcinoma excision  6/15    Nose    Allergies:  Alphagan; Azopt; Cefdinir; Macrobid; and Sulfa antibiotics Prior to  Admit Meds:   Prescriptions prior to admission  Medication Sig Dispense Refill Last Dose  . Ascorbic Acid (VITAMIN C) 1000 MG tablet Take 1,000 mg by mouth daily.   02/13/2015 at Unknown time  . aspirin 81 MG tablet Take 81 mg by mouth daily.   02/13/2015 at Unknown time  . atorvastatin (LIPITOR) 10 MG tablet Take 10 mg by mouth daily.   02/13/2015 at Unknown time  . beta carotene 10000 UNIT capsule Take 10,000 Units by mouth daily.   02/13/2015 at Unknown time  . calcium citrate (CALCITRATE - DOSED IN MG ELEMENTAL CALCIUM) 950 MG tablet Take 1 tablet by mouth daily.   02/13/2015 at Unknown time  . cholecalciferol (VITAMIN D) 1000 UNITS tablet Take 1,000 Units by mouth daily.   02/13/2015 at Unknown time  . conjugated estrogens (PREMARIN) vaginal cream Place vaginally 2 (two) times a week. 42.5 g 3 unknown at Unknown time  . glucosamine-chondroitin 500-400 MG tablet Take 1 tablet by mouth 3 (three) times daily.   02/13/2015 at Unknown time  . glyBURIDE (DIABETA) 5 MG tablet Take 5 mg by mouth 2 (two) times daily with a meal.    02/13/2015 at Unknown time  . L-Methylfolate-B6-B12 (METANX PO) Take by mouth 2 (two) times daily.   02/13/2015 at Unknown time  . nadolol-bendroflumethiazide (CORZIDE) 40-5 MG per tablet Take 1 tablet by mouth daily.  02/13/2015 at Unknown time  . Probiotic Product (PROBIOTIC DAILY PO) Take by mouth. Florastor   02/13/2015 at Unknown time  . sitaGLIPtan-metformin (JANUMET) 50-1000 MG per tablet Take 1 tablet by mouth 2 (two) times daily with a meal.   02/13/2015 at Unknown time  . telmisartan (MICARDIS) 80 MG tablet Take 80 mg by mouth daily.   02/13/2015 at Unknown time  . Travoprost, BAK Free, (TRAVATAN) 0.004 % SOLN ophthalmic solution Place 1 drop into both eyes at bedtime.   02/13/2015 at Unknown time  . vitamin E 400 UNIT capsule Take 400 Units by mouth daily.   02/13/2015 at Unknown time  . ACCU-CHEK AVIVA PLUS test strip    Taking   Fam HX:    Family History  Problem  Relation Age of Onset  . Heart disease  67  . CAD Brother   . Hypertension Brother    Social HX:    History   Social History  . Marital Status: Married    Spouse Name: N/A  . Number of Children: N/A  . Years of Education: N/A   Occupational History  . Not on file.   Social History Main Topics  . Smoking status: Former Smoker -- 0.10 packs/day for 2 years    Types: Cigarettes  . Smokeless tobacco: Never Used  . Alcohol Use: No  . Drug Use: No  . Sexual Activity: Yes    Birth Control/ Protection: Surgical     Comment: [facility]   Other Topics Concern  . Not on file   Social History Narrative     ROS:  All 11 ROS were addressed and are negative except what is stated in the HPI  Physical Exam: Blood pressure 127/47, pulse 78, temperature 98.2 F (36.8 C), temperature source Oral, resp. rate 22, height 5\' 3"  (1.6 m), weight 165 lb (74.844 kg), last menstrual period 07/24/1977, SpO2 99 %.    General: Well developed, well nourished, in no acute distress Head: Eyes PERRLA, No xanthomas.   Normal cephalic and atramatic  Lungs:   Clear bilaterally to auscultation and percussion. Heart:   HRRR S1 S2 Pulses are 2+ & equal.            No carotid bruit. No JVD.  No abdominal bruits. No femoral bruits. Abdomen: Bowel sounds are positive, abdomen soft and non-tender without masses  Extremities:   No clubbing, cyanosis  DP +1  Trace edema Neuro: Alert and oriented X 3. Psych:  Good affect, responds appropriately    Labs:   Lab Results  Component Value Date   WBC 11.8* 02/14/2015   HGB 9.2* 02/14/2015   HCT 28.6* 02/14/2015   MCV 85.9 02/14/2015   PLT 374 02/14/2015    Recent Labs Lab 02/14/15 0705  NA 134*  K 3.7  CL 100  CO2 22  BUN 55*  CREATININE 1.71*  CALCIUM 8.5  PROT 6.7  BILITOT 0.4  ALKPHOS 50  ALT 22  AST 30  GLUCOSE 218*   No results found for: PTT Lab Results  Component Value Date   INR 1.11 02/14/2015   Lab Results  Component Value Date    TROPONINI 0.57* 02/14/2015    No results found for: CHOL No results found for: HDL No results found for: LDLCALC No results found for: TRIG No results found for: CHOLHDL No results found for: LDLDIRECT    Radiology:  Dg Chest Port 1 View  02/14/2015   CLINICAL DATA:  Shortness of breath.  EXAM:  PORTABLE CHEST - 1 VIEW  COMPARISON:  None.  FINDINGS: The heart is at the upper limits of normal. There is vascular congestion and mild pulmonary edema. No confluent airspace disease. Questionable blunting of both costophrenic angles. No pneumothorax. No acute osseous abnormality.  IMPRESSION: Vascular congestion and pulmonary edema, may reflect CHF. Questionable blunting of the costophrenic angles, may reflect small effusions.   Electronically Signed   By: Jeb Levering M.D.   On: 02/14/2015 03:07    EKG:  NSR with LBBB  ASSESSMENT/PLAN:  1.  Acute CHF with unknown EF.  Most likely acute diastolic CHF in setting of acute hypertensive urgency.  Chest xray shows acute CHF and BNP elevated.  Symptoms improved with IV Lasix and she has put out 1L urine.  Continue IV lasix and follow daily weights, I&O's and renal function.  Check 2D echo to assess LVF. 2.  Elevated troponin most consistent with demand ischemia in the setting of hypertensive urgency and CHF as well as renal insuff with decreased clearance.  Continue to cycle to determine tend.  Continue ASA/BB.  3.  Acute kidney injury secondary to acute hypertensive urgency.  Creatinine 1.7 and hopefully will improve with aggressive BP control and diuresis 4.  Hypertensive urgency - BP now controlled.  TSH normal. Wean NTG off as BP tolerates.  Continue BB and consider adding amlodipine 5mg  daily. No ACE I or ARB due to renal insuff (she was on ARB at home) 5.  LBBB - ? New or old - no EKG to compare   Add Amlodipine 5mg  daily. Avoid ACEi or ARB due to renal insuff.   Sueanne Margarita, MD  02/14/2015  1:12 PM

## 2015-02-15 ENCOUNTER — Inpatient Hospital Stay (HOSPITAL_COMMUNITY): Payer: Medicare Other

## 2015-02-15 DIAGNOSIS — R7989 Other specified abnormal findings of blood chemistry: Secondary | ICD-10-CM

## 2015-02-15 DIAGNOSIS — I509 Heart failure, unspecified: Secondary | ICD-10-CM

## 2015-02-15 DIAGNOSIS — N19 Unspecified kidney failure: Secondary | ICD-10-CM

## 2015-02-15 LAB — POCT I-STAT 3, ART BLOOD GAS (G3+)
Acid-base deficit: 2 mmol/L (ref 0.0–2.0)
Bicarbonate: 22.7 meq/L (ref 20.0–24.0)
O2 Saturation: 83 %
Patient temperature: 98.6
TCO2: 24 mmol/L (ref 0–100)
pCO2 arterial: 38.7 mmHg (ref 35.0–45.0)
pH, Arterial: 7.376 (ref 7.350–7.450)
pO2, Arterial: 49 mmHg — ABNORMAL LOW (ref 80.0–100.0)

## 2015-02-15 LAB — GLUCOSE, CAPILLARY
Glucose-Capillary: 164 mg/dL — ABNORMAL HIGH (ref 70–99)
Glucose-Capillary: 137 mg/dL — ABNORMAL HIGH (ref 70–99)
Glucose-Capillary: 113 mg/dL — ABNORMAL HIGH (ref 70–99)
Glucose-Capillary: 177 mg/dL — ABNORMAL HIGH (ref 70–99)

## 2015-02-15 LAB — BASIC METABOLIC PANEL
Anion gap: 8 (ref 5–15)
BUN: 58 mg/dL — ABNORMAL HIGH (ref 6–23)
CO2: 26 mmol/L (ref 19–32)
Calcium: 8.1 mg/dL — ABNORMAL LOW (ref 8.4–10.5)
Chloride: 98 mmol/L (ref 96–112)
Creatinine, Ser: 2 mg/dL — ABNORMAL HIGH (ref 0.50–1.10)
GFR, EST AFRICAN AMERICAN: 26 mL/min — AB (ref >90)
GFR, EST NON AFRICAN AMERICAN: 23 mL/min — AB (ref >90)
Glucose, Bld: 171 mg/dL — ABNORMAL HIGH (ref 70–99)
POTASSIUM: 3.7 mmol/L (ref 3.5–5.1)
SODIUM: 132 mmol/L — AB (ref 135–145)

## 2015-02-15 LAB — CBC
HCT: 25 % — ABNORMAL LOW (ref 36.0–46.0)
HEMOGLOBIN: 8.1 g/dL — AB (ref 12.0–15.0)
MCH: 27.6 pg (ref 26.0–34.0)
MCHC: 32.4 g/dL (ref 30.0–36.0)
MCV: 85 fL (ref 78.0–100.0)
Platelets: 331 10*3/uL (ref 150–400)
RBC: 2.94 MIL/uL — AB (ref 3.87–5.11)
RDW: 13.6 % (ref 11.5–15.5)
WBC: 9.3 10*3/uL (ref 4.0–10.5)

## 2015-02-15 LAB — FERRITIN: FERRITIN: 46 ng/mL (ref 10–291)

## 2015-02-15 LAB — RETICULOCYTES
RBC.: 2.94 MIL/uL — ABNORMAL LOW (ref 3.87–5.11)
Retic Count, Absolute: 35.3 10*3/uL (ref 19.0–186.0)
Retic Ct Pct: 1.2 % (ref 0.4–3.1)

## 2015-02-15 LAB — IRON AND TIBC
IRON: 19 ug/dL — AB (ref 42–145)
Saturation Ratios: 8 % — ABNORMAL LOW (ref 20–55)
TIBC: 253 ug/dL (ref 250–470)
UIBC: 234 ug/dL (ref 125–400)

## 2015-02-15 LAB — HEMOGLOBIN A1C
Hgb A1c MFr Bld: 7.5 % — ABNORMAL HIGH (ref 4.8–5.6)
Mean Plasma Glucose: 169 mg/dL

## 2015-02-15 LAB — HEPARIN LEVEL (UNFRACTIONATED)
HEPARIN UNFRACTIONATED: 0.29 [IU]/mL — AB (ref 0.30–0.70)
Heparin Unfractionated: 0.3 IU/mL (ref 0.30–0.70)

## 2015-02-15 LAB — VITAMIN B12

## 2015-02-15 LAB — FOLATE: Folate: >20 ng/mL

## 2015-02-15 LAB — TROPONIN I: Troponin I: 0.56 ng/mL (ref <0.031)

## 2015-02-15 MED ORDER — ASPIRIN EC 81 MG PO TBEC
81.0000 mg | DELAYED_RELEASE_TABLET | Freq: Every day | ORAL | Status: DC
  Administered 2015-02-16 – 2015-02-18 (×3): 81 mg via ORAL
  Filled 2015-02-15 (×3): qty 1

## 2015-02-15 MED ORDER — HYDRALAZINE HCL 20 MG/ML IJ SOLN: 10.0000 mg | Freq: Four times a day (QID) | INTRAMUSCULAR | Status: DC | PRN

## 2015-02-15 MED ORDER — HEPARIN (PORCINE) IN NACL 100-0.45 UNIT/ML-% IJ SOLN
1100.0000 [IU]/h | INTRAMUSCULAR | Status: DC
  Filled 2015-02-15 (×3): qty 250

## 2015-02-15 MED ORDER — HEPARIN SODIUM (PORCINE) 5000 UNIT/ML IJ SOLN
5000.0000 [IU] | Freq: Three times a day (TID) | INTRAMUSCULAR | Status: DC
  Administered 2015-02-15 – 2015-02-18 (×8): 5000 [IU] via SUBCUTANEOUS
  Filled 2015-02-15 (×9): qty 1

## 2015-02-15 MED ORDER — CETYLPYRIDINIUM CHLORIDE 0.05 % MT LIQD
7.0000 mL | Freq: Two times a day (BID) | OROMUCOSAL | Status: DC
  Administered 2015-02-15 – 2015-02-17 (×4): 7 mL via OROMUCOSAL

## 2015-02-15 NOTE — Progress Notes (Signed)
Received report from [place], pt stable,

## 2015-02-15 NOTE — Progress Notes (Signed)
Patient: Katie Fuentes / Admit Date: 02/14/2015 / Date of Encounter: 02/15/2015, 2:20 PM   Subjective: Feeling better. No more SOB. No orthopnea. No chest pain. Mild abdominal distention still. No nausea, vomiting. Overall just feels tired. Denies knowledge of CKD or anemia. PCP is Tommy Medal at [facility]. Husband has copies of labs at home and will bring them in. She reports echo 8 years ago for unclear reasons, not known to be abnormal.  She is from [place]. Has lived here for almost 2 decades.  Objective: Telemetry: NSR Physical Exam: Blood pressure 126/53, pulse 80, temperature 98.1 F (36.7 C), temperature source Oral, resp. rate 24, height 5\' 3"  (1.6 m), weight 165 lb (74.844 kg), last menstrual period 07/24/1977, SpO2 100 %. General: Well developed, well nourished WF, in no acute distress. Head: Normocephalic, atraumatic, sclera non-icteric, no xanthomas, nares are without discharge. Alopecia noted Neck: JVP not elevated. Lungs: Minimal crackles at bases, no wheezes, rales, or rhonchi. Breathing is unlabored. Heart: RRR S1 S2 without murmurs, rubs, or gallops.  Abdomen: Soft, non-tender, non-distended with normoactive bowel sounds. No rebound/guarding. Extremities: No clubbing or cyanosis. No edema.  Neuro: Alert and oriented X 3. Moves all extremities spontaneously. Psych:  Responds to questions appropriately with a normal affect.   Intake/Output Summary (Last 24 hours) at 02/15/15 1420 Last data filed at 02/15/15 1302  Gross per 24 hour  Intake 616.85 ml  Output   2750 ml  Net -2133.15 ml    Inpatient Medications:  . antiseptic oral rinse  7 mL Mouth Rinse BID  . aspirin EC  325 mg Oral Daily  . atorvastatin  10 mg Oral q1800  . furosemide  40 mg Intravenous Daily  . insulin aspart  0-9 Units Subcutaneous TID WC  . latanoprost  1 drop Both Eyes QHS  . nadolol  40 mg Oral Daily   Infusions:  . heparin 1,000 Units/hr (02/15/15 0515)     Labs:  Recent Labs  02/14/15 0705 02/15/15 0247  NA 134* 132*  K 3.7 3.7  CL 100 98  CO2 22 26  GLUCOSE 218* 171*  BUN 55* 58*  CREATININE 1.71* 2.00*  CALCIUM 8.5 8.1*    Recent Labs  02/14/15 0217 02/14/15 0705  AST 29 30  ALT 24 22  ALKPHOS 58 50  BILITOT 0.6 0.4  PROT 7.3 6.7  ALBUMIN 3.6 3.3*    Recent Labs  02/14/15 0217  02/14/15 0705 02/15/15 0247  WBC 14.8*  --  11.8* 9.3  NEUTROABS 11.0*  --  10.6*  --   HGB 10.1*  < > 9.2* 8.1*  HCT 31.7*  < > 28.6* 25.0*  MCV 86.8  --  85.9 85.0  PLT 466*  --  374 331  < > = values in this interval not displayed.  Recent Labs  02/14/15 0705 02/14/15 1118 02/14/15 1637 02/15/15 0930  TROPONINI 0.57* 1.16* 1.18* 0.56*   Invalid input(s): POCBNP  Recent Labs  02/14/15 0705  HGBA1C 7.5*     Radiology/Studies:  US Renal  02/15/2015   CLINICAL DATA:  Acute renal failure  EXAM: RENAL/URINARY TRACT ULTRASOUND COMPLETE  COMPARISON:  09/21/2012  FINDINGS: Right Kidney:  Length: 12 cm. Diffuse mild increase in cortical echogenicity with thinning. No hydronephrosis.  Left Kidney:  Length: 10 cm. Diffuse mild increase in cortical echogenicity with thinning. No hydronephrosis  Bladder:  Appears normal for degree of bladder distention.  IMPRESSION: 1. Medical renal disease and mild left renal atrophy. 2. No  hydronephrosis.   Electronically Signed   By: Monte Fantasia M.D.   On: 02/15/2015 10:36   Dg Chest Port 1 View  02/14/2015   CLINICAL DATA:  Shortness of breath.  EXAM: PORTABLE CHEST - 1 VIEW  COMPARISON:  None.  FINDINGS: The heart is at the upper limits of normal. There is vascular congestion and mild pulmonary edema. No confluent airspace disease. Questionable blunting of both costophrenic angles. No pneumothorax. No acute osseous abnormality.  IMPRESSION: Vascular congestion and pulmonary edema, may reflect CHF. Questionable blunting of the costophrenic angles, may reflect small effusions.   Electronically  Signed   By: Jeb Levering M.D.   On: 02/14/2015 03:07     Assessment and Plan  1. Accelerated hypertension, improved after diuresis  2. Acute hypoxic respiratory failure requiring bipap - improved, hopeful to wean off Faith today  3. Acute CHF, type not yet differentiated  - 2D echo still pending - she appears approaching euvolemia - will d/c Lasix and follow creatinine in AM. Will probably need resumption of oral diuretic but want to see Cr trajectory - f/u daily weights - not recorded today. I/O -2.6L net - continue BB and anticipate change to carvedilol if LV dysfunction is present  4. Elevated troponin  - peak 1.18 - may be due to demand ischemia in the setting of hypertensive urgency and CHF as well as renal insufficiency but has risk factors for underlying cardiac disease - will need to determine plan for ischemic evaluation based on echocardiogram results, although with CKD and anemia patient is not idea candidate for cath at present time - will change heparin to DVT prophylaxis only given downtrend of troponin and absence of chest pain - lipids in AM to risk stratify  5. Acute kidney injury secondary to acute hypertensive urgency, possible CKD (baseline unknown) - renal US with medical renal disease and mild L renal atrophy - home ARB on hold  6. Normocytic anemia, question iron deficiency with low iron, borderline low ferritin, and low % saturation - denies any BRBPR, melena, hematuria or any other sources of bleeding - hemoccult pending - decrease ASA back to home dose 81mg  daily - further per IM  7. LBBB, unknown chronicity  8. Diabetes mellitus uncontrolled A1C 7.5  Signed, Dayna Dunn PA-C  Patient seen, examined. Available data reviewed. Agree with findings, assessment, and plan as outlined by Melina Copa, PA-C. The patient is independently interviewed and examined. I agree with the plan as outlined. The patient has had quick improvement with management of her blood  pressure and diuresis. I think there is significant concern for ischemic heart disease in the setting of acute pulmonary edema and left bundle branch block. However, I agree that with significant anemia and acute on chronic kidney injury, cardiac catheterization as significant potential complications. I would favor an inpatient pharmacologic Myoview stress test for risk stratification. As long as the test is low risk, I would be inclined to manage her medically. Also would recommend a renal arterial duplex to evaluate for bilateral renal artery stenosis in the setting of kidney disease and acute pulmonary edema. I will arrange this as an outpatient.  Sherren Mocha, M.D. 02/15/2015 4:10 PM

## 2015-02-15 NOTE — Progress Notes (Signed)
Called to give [place] report and she stated she would call 2 c nurse back as she was busy.

## 2015-02-15 NOTE — Progress Notes (Signed)
ANTICOAGULATION CONSULT NOTE - Follow-up Consult  Pharmacy Consult for heparin Indication: elevated troponin  Allergies  Allergen Reactions  . Alphagan [Brimonidine] Other (See Comments)    Redness in the eyes  . Azopt [Brinzolamide]     Redness in the eyes  . Cefdinir Diarrhea  . Macrobid [Nitrofurantoin Macrocrystal] Nausea And Vomiting    Fever, Chills  . Sulfa Antibiotics Nausea And Vomiting and Other (See Comments)    Fever,chills    Patient Measurements: Height: 5\' 3"  (160 cm) Weight: 165 lb (74.844 kg) IBW/kg (Calculated) : 52.4 Heparin Dosing Weight: 68 kg  Vital Signs: Temp: 98.1 F (36.7 C) (03/25 0850) Temp Source: Oral (03/25 0850) BP: 146/58 mmHg (03/25 0850) Pulse Rate: 95 (03/25 0850)  Labs:  Recent Labs  02/14/15 0217 02/14/15 0228  02/14/15 0705 02/14/15 1118 02/14/15 1637 02/14/15 2100 02/15/15 0247 02/15/15 0930 02/15/15 1110  HGB 10.1* 11.9*  --  9.2*  --   --   --  8.1*  --   --   HCT 31.7* 35.0*  --  28.6*  --   --   --  25.0*  --   --   PLT 466*  --   --  374  --   --   --  331  --   --   LABPROT 14.4  --   --   --   --   --   --   --   --   --   INR 1.11  --   --   --   --   --   --   --   --   --   HEPARINUNFRC  --   --   --   --   --   --  0.44 0.29*  --  0.30  CREATININE 1.72* 1.60*  --  1.71*  --   --   --  2.00*  --   --   TROPONINI  --   --   < > 0.57* 1.16* 1.18*  --   --  0.56*  --   < > = values in this interval not displayed.  Estimated Creatinine Clearance: 22.1 mL/min (by C-G formula based on Cr of 2).   Medical History: Past Medical History  Diagnosis Date  . Atrophic vaginitis   . Yeast infection   . Glaucoma   . Cataracts, bilateral   . Diabetes mellitus without complication 0/1027  . Hyperlipidemia   . Hypertension     Medications:  . heparin 1,000 Units/hr (02/15/15 0515)    Assessment: 81 y/o female who presented to the ED with severe SOB and admitted for CHF exacerbation and hypertensive urgency. Trop  came back positive likely due to demand ischemia per Cards. She continues on IV heparin.   Heparin level is therapeutic on the low end of normal at 0.3. No bleeding noted, Hb has trended down 8.1 < 9.2, platelets remain normal.  Goal of Therapy:  Heparin level 0.3-0.7 units/ml Monitor platelets by anticoagulation protocol: Yes   Plan:  - Increase heparin drip to 1100 units/hr - Daily heparin level and CBC - Monitor for s/sx of bleeding  [facility], [place].D., BCPS Clinical Pharmacist Pager: [phone] 02/15/2015 12:24 PM

## 2015-02-15 NOTE — Progress Notes (Signed)
Gave report to nurse Nicki Reaper on [place] has transfer orders to go to this floor. Pt is in no distress. Pt is alert and oriented x 4. Notifying telemetry.

## 2015-02-15 NOTE — Progress Notes (Signed)
[place] for heparin Indication: chest pain/ACS  Allergies  Allergen Reactions  . Alphagan [Brimonidine] Other (See Comments)    Redness in the eyes  . Azopt [Brinzolamide]     Redness in the eyes  . Cefdinir Diarrhea  . Macrobid [Nitrofurantoin Macrocrystal] Nausea And Vomiting    Fever, Chills  . Sulfa Antibiotics Nausea And Vomiting and Other (See Comments)    Fever,chills    Patient Measurements: Height: 5\' 3"  (160 cm) Weight: 165 lb (74.844 kg) IBW/kg (Calculated) : 52.4 Heparin Dosing Weight: 68 kg  Vital Signs: Temp: 97.6 F (36.4 C) (03/25 0000) Temp Source: Axillary (03/25 0000) BP: 133/51 mmHg (03/25 0000) Pulse Rate: 79 (03/25 0000)  Labs:  Recent Labs  02/14/15 0217 02/14/15 0228 02/14/15 0705 02/14/15 1118 02/14/15 1637 02/14/15 2100 02/15/15 0247  HGB 10.1* 11.9* 9.2*  --   --   --  8.1*  HCT 31.7* 35.0* 28.6*  --   --   --  25.0*  PLT 466*  --  374  --   --   --  331  LABPROT 14.4  --   --   --   --   --   --   INR 1.11  --   --   --   --   --   --   HEPARINUNFRC  --   --   --   --   --  0.44 0.29*  CREATININE 1.72* 1.60* 1.71*  --   --   --   --   TROPONINI  --   --  0.57* 1.16* 1.18*  --   --     Estimated Creatinine Clearance: 25.9 mL/min (by C-G formula based on Cr of 1.71).   Assessment: 81 y/o female who presented to the ED with severe SOB and admitted for CHF exacerbation and hypertensive urgency. Trop came back positive. Pharmacy consulted to dose IV heparin.   This AM the heparin level = 0.29, dropped from 0.44 on rate of 850 units/hr. Hgb 8.1,  PLTC 331K    Goal of Therapy:  Heparin level 0.3-0.7 units/ml Monitor platelets by anticoagulation protocol: Yes   Plan:  Increase Heparin drip to 1000 units/hr Check heparin level in 6 hours Daily heparin level and CBC Monitor for s/sx of bleeding   Nicole Cella, RPh Clinical Pharmacist Pager: [phone] 02/15/2015 4:24 AM

## 2015-02-15 NOTE — Progress Notes (Signed)
[facility] PROGRESS NOTE  Katie Fuentes JSE:[id number] DOB: [birth year] DOA: 02/14/2015  PCP: Tommy Medal, MD  Brief HPI: 81 year old Caucasian female with a past medical history of hypertension, hyperlipidemia, diabetes, presented with worsening shortness of breath. According to the patient and her husband this started quite suddenly. She also noticed some swelling in her legs. She was thought to have acute congestive heart failure and was admitted to the hospital for further management. She was initially placed on BiPAP.  Past medical history:  Past Medical History  Diagnosis Date  . Atrophic vaginitis   . Yeast infection   . Glaucoma   . Cataracts, bilateral   . Diabetes mellitus without complication 0/7371  . Hyperlipidemia   . Hypertension     Consultants: Cardiology  Procedures:  2-D echocardiogram is pending  Antibiotics: None  Subjective: Patient feels much better. Her breathing is much improved. Denies any chest pain. Hasn't noticed any blood in stool or urine. No black colored stool.   Objective: Vital Signs  Filed Vitals:   02/15/15 0200 02/15/15 0300 02/15/15 0400 02/15/15 0500  BP: 123/50 129/51 131/51 140/53  Pulse: 79 82 78 80  Temp:   98.3 F (36.8 C)   TempSrc:   Oral   Resp: 15 21 21 20   Height:      Weight:      SpO2: 98% 100% 98% 100%    Intake/Output Summary (Last 24 hours) at 02/15/15 0726 Last data filed at 02/15/15 0500  Gross per 24 hour  Intake 1024.85 ml  Output   3000 ml  Net -1975.15 ml   Filed Weights   02/14/15 0221  Weight: 74.844 kg (165 lb)    General appearance: alert, cooperative, appears stated age and no distress Resp: Crackles noted bilateral bases, improved from yesterday. No wheezing. No rhonchi. Cardio: regular rate and rhythm, S1, S2 normal, no murmur, click, rub or gallop GI: soft, non-tender; bowel sounds normal; no masses,  no organomegaly Extremities: Pedal edema is noted bilaterally,  improving Neurologic: Alert and oriented 3. No focal neurological deficits are noted.  Lab Results:  Basic Metabolic Panel:  Recent Labs Lab 02/14/15 0217 02/14/15 0228 02/14/15 0705 02/15/15 0247  NA 133* 135 134* 132*  K 4.0 4.0 3.7 3.7  CL 96 98 100 98  CO2 22  --  22 26  GLUCOSE 348* 355* 218* 171*  BUN 51* 49* 55* 58*  CREATININE 1.72* 1.60* 1.71* 2.00*  CALCIUM 8.6  --  8.5 8.1*   Liver Function Tests:  Recent Labs Lab 02/14/15 0217 02/14/15 0705  AST 29 30  ALT 24 22  ALKPHOS 58 50  BILITOT 0.6 0.4  PROT 7.3 6.7  ALBUMIN 3.6 3.3*    Recent Labs Lab 02/14/15 0217  LIPASE 59   CBC:  Recent Labs Lab 02/14/15 0217 02/14/15 0228 02/14/15 0705 02/15/15 0247  WBC 14.8*  --  11.8* 9.3  NEUTROABS 11.0*  --  10.6*  --   HGB 10.1* 11.9* 9.2* 8.1*  HCT 31.7* 35.0* 28.6* 25.0*  MCV 86.8  --  85.9 85.0  PLT 466*  --  374 331   Cardiac Enzymes:  Recent Labs Lab 02/14/15 0705 02/14/15 1118 02/14/15 1637  TROPONINI 0.57* 1.16* 1.18*   BNP (last 3 results)  Recent Labs  02/14/15 0247  BNP 1346.9*   CBG:  Recent Labs Lab 02/14/15 0513 02/14/15 0824 02/14/15 1138 02/14/15 1641 02/14/15 2209  GLUCAP 231* 173* 137* 123* 119*    Recent Results (  from the past 240 hour(s))  MRSA PCR Screening     Status: None   Collection Time: 02/14/15  4:29 AM  Result Value Ref Range Status   MRSA by PCR NEGATIVE NEGATIVE Final    Comment:        The GeneXpert MRSA Assay (FDA approved for NASAL specimens only), is one component of a comprehensive MRSA colonization surveillance program. It is not intended to diagnose MRSA infection nor to guide or monitor treatment for MRSA infections.       Studies/Results: Dg Chest Port 1 View  02/14/2015   CLINICAL DATA:  Shortness of breath.  EXAM: PORTABLE CHEST - 1 VIEW  COMPARISON:  None.  FINDINGS: The heart is at the upper limits of normal. There is vascular congestion and mild pulmonary edema. No  confluent airspace disease. Questionable blunting of both costophrenic angles. No pneumothorax. No acute osseous abnormality.  IMPRESSION: Vascular congestion and pulmonary edema, may reflect CHF. Questionable blunting of the costophrenic angles, may reflect small effusions.   Electronically Signed   By: Jeb Levering M.D.   On: 02/14/2015 03:07    Medications:  Scheduled: . antiseptic oral rinse  7 mL Mouth Rinse BID  . aspirin EC  325 mg Oral Daily  . atorvastatin  10 mg Oral q1800  . furosemide  40 mg Intravenous Daily  . insulin aspart  0-9 Units Subcutaneous TID WC  . latanoprost  1 drop Both Eyes QHS  . nadolol  40 mg Oral Daily   Continuous: . heparin 1,000 Units/hr (02/15/15 0515)   HWE:[id number] **OR** acetaminophen, ondansetron **OR** ondansetron (ZOFRAN) IV  Assessment/Plan:  Principal Problem:   Acute respiratory failure with hypoxia Active Problems:   Hypertensive urgency   Diabetes mellitus type 2, uncontrolled   Normocytic anemia   Hyperlipidemia   Renal failure   Acute CHF (congestive heart failure)   Elevated troponin    Acute respiratory failure with hypoxia secondary to decompensated CHF - unknown EF Patient is symptomatically improved after receiving Lasix. She is diuresing well. However, her creatinine has gone up. Cardiology is following. Echocardiogram is still pending. Continue current dose of Lasix for now. Strict ins and outs and daily weights. Continue to hold the ARB for now due to elevated creatinine. She has not required BiPAP and more than 24 hours. Repeat ABG shows improvement respiratory acidosis. She is oxygenating well on nasal cannula. Lactic acid was normal.  Possible non-ST elevation MI Troponin is elevated. EKG is abnormal. No older EKGs available for comparison. Old EKGs have been requested from her PCPs office. Continue aspirin. Continue intravenous heparin for now. Troponin level from this morning is pending Cardiology is  following. Continue beta blocker.   Hypertensive urgency Blood pressure was in the 101B to 510 systolic at the time of admission. Improved with intravenous nitroglycerin. She has been weaned off of intravenous nitroglycerin. Hydralazine as needed. Blood pressure is reasonably well controlled. Continue to monitor for now.  Diabetes mellitus type 2 uncontrolled HbA1c is 7.5. Continue sliding scale coverage for now. May need to add basal insulin. Continue to monitor.  Renal failure probably acute on chronic Baseline creatinine not known. Creatinine has climbed to 2.0. We'll get a renal ultrasound. Continue to hold ARB for now. Monitor urine output. If renal function continues to get worse. Nephrology input may be necessary. UA was reviewed. No proteinuria noted.  Hyperlipidemia Continue statins.  Normocytic normochromic anemia Hemoglobin has trended down. No overt blood loss. Anemia panel is pending. Continue to  monitor hemoglobin.   Leukocytosis Probably reactionary. No normal. Patient is afebrile. Closely follow.  DVT Prophylaxis: On IV heparin.     Code Status: Full code  Family Communication: Discussed with the patient and her husband  Disposition Plan: Not ready for discharge. Okay for transfer to telemetry. Echocardiogram is pending.    LOS: 1 day   [facility] Pager [phone] 02/15/2015, 7:26 AM  If 7PM-7AM, please contact night-coverage at [email or web address], password [facility]

## 2015-02-16 ENCOUNTER — Inpatient Hospital Stay (HOSPITAL_COMMUNITY): Payer: Medicare Other

## 2015-02-16 DIAGNOSIS — I447 Left bundle-branch block, unspecified: Secondary | ICD-10-CM

## 2015-02-16 DIAGNOSIS — R7989 Other specified abnormal findings of blood chemistry: Secondary | ICD-10-CM

## 2015-02-16 DIAGNOSIS — E785 Hyperlipidemia, unspecified: Secondary | ICD-10-CM

## 2015-02-16 DIAGNOSIS — I248 Other forms of acute ischemic heart disease: Secondary | ICD-10-CM

## 2015-02-16 DIAGNOSIS — D509 Iron deficiency anemia, unspecified: Secondary | ICD-10-CM

## 2015-02-16 DIAGNOSIS — N179 Acute kidney failure, unspecified: Secondary | ICD-10-CM

## 2015-02-16 DIAGNOSIS — I5021 Acute systolic (congestive) heart failure: Secondary | ICD-10-CM

## 2015-02-16 LAB — BASIC METABOLIC PANEL
Anion gap: 11 (ref 5–15)
BUN: 63 mg/dL — AB (ref 6–23)
CHLORIDE: 99 mmol/L (ref 96–112)
CO2: 26 mmol/L (ref 19–32)
CREATININE: 1.92 mg/dL — AB (ref 0.50–1.10)
Calcium: 8.5 mg/dL (ref 8.4–10.5)
GFR, EST AFRICAN AMERICAN: 27 mL/min — AB (ref >90)
GFR, EST NON AFRICAN AMERICAN: 24 mL/min — AB (ref >90)
GLUCOSE: 133 mg/dL — AB (ref 70–99)
POTASSIUM: 4 mmol/L (ref 3.5–5.1)
Sodium: 136 mmol/L (ref 135–145)

## 2015-02-16 LAB — LIPID PANEL
CHOL/HDL RATIO: 4.6 ratio
Cholesterol: 125 mg/dL (ref 0–200)
HDL: 27 mg/dL — AB (ref >39)
LDL CALC: 62 mg/dL (ref 0–99)
TRIGLYCERIDES: 180 mg/dL — AB (ref <150)
VLDL: 36 mg/dL (ref 0–40)

## 2015-02-16 LAB — GLUCOSE, CAPILLARY
GLUCOSE-CAPILLARY: 182 mg/dL — AB (ref 70–99)
GLUCOSE-CAPILLARY: 88 mg/dL (ref 70–99)
Glucose-Capillary: 258 mg/dL — ABNORMAL HIGH (ref 70–99)
Glucose-Capillary: 157 mg/dL — ABNORMAL HIGH (ref 70–99)

## 2015-02-16 MED ORDER — REGADENOSON 0.4 MG/5ML IV SOLN
INTRAVENOUS | Status: AC
  Administered 2015-02-16: 0.4 mg via INTRAVENOUS
  Filled 2015-02-16: qty 5

## 2015-02-16 MED ORDER — POLYSACCHARIDE IRON COMPLEX 150 MG PO CAPS
150.0000 mg | ORAL_CAPSULE | Freq: Every day | ORAL | Status: DC
  Administered 2015-02-16 – 2015-02-18 (×3): 150 mg via ORAL
  Filled 2015-02-16 (×4): qty 1

## 2015-02-16 MED ORDER — DOCUSATE SODIUM 100 MG PO CAPS
100.0000 mg | ORAL_CAPSULE | Freq: Two times a day (BID) | ORAL | Status: DC
  Administered 2015-02-16 – 2015-02-17 (×3): 100 mg via ORAL
  Filled 2015-02-16 (×4): qty 1

## 2015-02-16 MED ORDER — TECHNETIUM TC 99M SESTAMIBI GENERIC - CARDIOLITE
10.0000 | Freq: Once | INTRAVENOUS | Status: AC | PRN
Start: 2015-02-16 — End: 2015-02-16
  Administered 2015-02-16: 10 via INTRAVENOUS

## 2015-02-16 MED ORDER — TECHNETIUM TC 99M SESTAMIBI - CARDIOLITE
30.0000 | Freq: Once | INTRAVENOUS | Status: AC | PRN
  Administered 2015-02-16: 11:00:00 30 via INTRAVENOUS

## 2015-02-16 MED ORDER — REGADENOSON 0.4 MG/5ML IV SOLN
0.4000 mg | Freq: Once | INTRAVENOUS | Status: AC
  Administered 2015-02-16: 0.4 mg via INTRAVENOUS
  Filled 2015-02-16: qty 5

## 2015-02-16 NOTE — Progress Notes (Signed)
[facility] PROGRESS NOTE  Katie Fuentes QTM:[id number] DOB: August 14, 1934 DOA: 02/14/2015  PCP: Tommy Medal, MD  Brief HPI: 81 year old Caucasian female with a past medical history of hypertension, hyperlipidemia, diabetes, presented with worsening shortness of breath. According to the patient and her husband this started quite suddenly. She also noticed some swelling in her legs. She was thought to have acute congestive heart failure and was admitted to the hospital for further management. She was initially placed on BiPAP. She was stabilized and transferred to the floor. Cardiology was consulted.  Past medical history:  Past Medical History  Diagnosis Date  . Atrophic vaginitis   . Yeast infection   . Glaucoma   . Cataracts, bilateral   . Diabetes mellitus without complication 04/2562  . Hyperlipidemia   . Hypertension     Consultants: Cardiology  Procedures:  2-D echocardiogram is pending  Nuclear stress test 1. No reversible ischemia, though there is a fixed defect in the anterior septal wall in the mid ventricle. 2. Global hypokinesis. 3. Left ventricular ejection fraction 22% 4. High-risk stress test findings*, defined by the patient's severely abnormal end-diastolic volume and ejection fraction.  Antibiotics: None  Subjective: Patient feels well. Denies any new complaints. Making urine. No chest pain or difficulty breathing currently. Reports colonoscopy within the last 5 years and which was unremarkable.   Objective: Vital Signs  Filed Vitals:   02/16/15 1040 02/16/15 1042 02/16/15 1043 02/16/15 1216  BP:  140/66 139/64 140/49  Pulse: 85 96 93 85  Temp:      TempSrc:      Resp:      Height:      Weight:      SpO2: 95%   97%    Intake/Output Summary (Last 24 hours) at 02/16/15 1228 Last data filed at 02/16/15 8937  Gross per 24 hour  Intake    120 ml  Output    600 ml  Net   -480 ml   Filed Weights   02/14/15 0221 02/16/15 0519    Weight: 74.844 kg (165 lb) 74.2 kg (163 lb 9.3 oz)    General appearance: alert, cooperative, appears stated age and no distress Resp: Improving aeration entry bilaterally. Few crackles noted today. No wheezing. No rhonchi. Cardio: regular rate and rhythm, S1, S2 normal, no murmur, click, rub or gallop GI: soft, non-tender; bowel sounds normal; no masses,  no organomegaly Extremities: Pedal edema is noted bilaterally, improving Neurologic: Alert and oriented 3. No focal neurological deficits are noted.  Lab Results:  Basic Metabolic Panel:  Recent Labs Lab 02/14/15 0217 02/14/15 0228 02/14/15 0705 02/15/15 0247 02/16/15 0513  NA 133* 135 134* 132* 136  K 4.0 4.0 3.7 3.7 4.0  CL 96 98 100 98 99  CO2 22  --  22 26 26   GLUCOSE 348* 355* 218* 171* 133*  BUN 51* 49* 55* 58* 63*  CREATININE 1.72* 1.60* 1.71* 2.00* 1.92*  CALCIUM 8.6  --  8.5 8.1* 8.5   Liver Function Tests:  Recent Labs Lab 02/14/15 0217 02/14/15 0705  AST 29 30  ALT 24 22  ALKPHOS 58 50  BILITOT 0.6 0.4  PROT 7.3 6.7  ALBUMIN 3.6 3.3*    Recent Labs Lab 02/14/15 0217  LIPASE 59   CBC:  Recent Labs Lab 02/14/15 0217 02/14/15 0228 02/14/15 0705 02/15/15 0247  WBC 14.8*  --  11.8* 9.3  NEUTROABS 11.0*  --  10.6*  --   HGB 10.1* 11.9* 9.2* 8.1*  HCT 31.7* 35.0* 28.6* 25.0*  MCV 86.8  --  85.9 85.0  PLT 466*  --  374 331   Cardiac Enzymes:  Recent Labs Lab 02/14/15 0705 02/14/15 1118 02/14/15 1637 02/15/15 0930  TROPONINI 0.57* 1.16* 1.18* 0.56*   BNP (last 3 results)  Recent Labs  02/14/15 0247  BNP 1346.9*   CBG:  Recent Labs Lab 02/15/15 1239 02/15/15 1744 02/15/15 2229 02/16/15 0811 02/16/15 1203  GLUCAP 137* 113* 177* 182* 258*    Recent Results (from the past 240 hour(s))  MRSA PCR Screening     Status: None   Collection Time: 02/14/15  4:29 AM  Result Value Ref Range Status   MRSA by PCR NEGATIVE NEGATIVE Final    Comment:        The GeneXpert MRSA  Assay (FDA approved for NASAL specimens only), is one component of a comprehensive MRSA colonization surveillance program. It is not intended to diagnose MRSA infection nor to guide or monitor treatment for MRSA infections.       Studies/Results: US Renal  02/15/2015   CLINICAL DATA:  Acute renal failure  EXAM: RENAL/URINARY TRACT ULTRASOUND COMPLETE  COMPARISON:  09/21/2012  FINDINGS: Right Kidney:  Length: 12 cm. Diffuse mild increase in cortical echogenicity with thinning. No hydronephrosis.  Left Kidney:  Length: 10 cm. Diffuse mild increase in cortical echogenicity with thinning. No hydronephrosis  Bladder:  Appears normal for degree of bladder distention.  IMPRESSION: 1. Medical renal disease and mild left renal atrophy. 2. No hydronephrosis.   Electronically Signed   By: Monte Fantasia M.D.   On: 02/15/2015 10:36    Medications:  Scheduled: . antiseptic oral rinse  7 mL Mouth Rinse BID  . aspirin EC  81 mg Oral Daily  . atorvastatin  10 mg Oral q1800  . heparin subcutaneous  5,000 Units Subcutaneous 3 times per day  . insulin aspart  0-9 Units Subcutaneous TID WC  . latanoprost  1 drop Both Eyes QHS  . nadolol  40 mg Oral Daily   Continuous:   NFA:[id number] **OR** acetaminophen, hydrALAZINE, ondansetron **OR** ondansetron (ZOFRAN) IV  Assessment/Plan:  Principal Problem:   Acute respiratory failure with hypoxia Active Problems:   Hypertensive urgency   Diabetes mellitus type 2, uncontrolled   Normocytic anemia   Hyperlipidemia   Renal failure   Acute CHF (congestive heart failure)   Elevated troponin    Acute respiratory failure with hypoxia secondary to decompensated acute systolic CHF EF based on the nuclear stress test was about 22%. Echocardiogram is still pending. Patient improved with intravenous Lasix. Lasix was held due to climbing creatinine. Patient symptomatically is stable. Creatinine is improved today. Echocardiogram will be done today.  Discussed with Dr. Bronson Ing with cardiology. Await workup to be completed. Check room air saturations. Repeat ABG showed improvement respiratory acidosis. She is oxygenating well on nasal cannula. Lactic acid was normal.  Possible non-ST elevation MI Troponin was elevated. EKG is abnormal. No older EKGs available for comparison. Old EKGs have been requested from her PCPs office. Continue aspirin. Intravenous heparin was discontinued. Troponin trending down. Nuclear stress test results as above. No reversible ischemia, but significantly reduced EF. Continue beta blocker. Further management per cardiology. Could be switched over to carvedilol, but will defer to cardiology.   Hypertensive urgency Blood pressure was in the 295M to 841 systolic at the time of admission. Improved with intravenous nitroglycerin. She has been weaned off of intravenous nitroglycerin. Hydralazine as needed. Blood pressure is reasonably well  controlled. Continue to monitor for now. Cardiology to pursue outpatient testing for renal artery stenosis.  Diabetes mellitus type 2 uncontrolled HbA1c is 7.5. Continue sliding scale coverage for now. May need to add basal insulin. Continue to monitor.  Renal failure acute on chronic Baseline creatinine not known. Creatinine had climbed to 2.0. Renal ultrasound does suggest medical renal disease. Creatinine is slightly better today. Continue to hold Lasix. Continue to hold ARB for now. She actually may not be a candidate for ACE inhibitor or ARB anymore. UA was reviewed. No proteinuria noted.  Hyperlipidemia Continue statins. LDL 62.  Iron deficiency anemia  I am was 19 with a ferritin of 46. Folate and B-12 were normal. Check hemoglobin tomorrow. No overt bleeding. She reports colonoscopy within the last 5 years. She will benefit from iron supplementation which will be ordered. This will need to be worked up further as an outpatient.   Leukocytosis Probably reactionary. Now normal.  Patient is afebrile. Closely follow.  DVT Prophylaxis: Subcutaneous heparin  Code Status: Full code  Family Communication: Discussed with the patient and her husband  Disposition Plan: Not ready for discharge. Echocardiogram is pending.    LOS: 2 days   [place] [facility] Pager [phone] 02/16/2015, 12:28 PM  If 7PM-7AM, please contact night-coverage at [email or web address], password [facility]

## 2015-02-16 NOTE — Progress Notes (Signed)
  Echocardiogram 2D Echocardiogram has been performed.  Katie Fuentes 02/16/2015, 2:24 PM

## 2015-02-16 NOTE — Progress Notes (Signed)
Patient Name: Katie Fuentes Date of Encounter: 02/16/2015  Principal Problem:   Acute respiratory failure with hypoxia Active Problems:   Hypertensive urgency   Diabetes mellitus type 2, uncontrolled   Normocytic anemia   Hyperlipidemia   Renal failure   Acute CHF (congestive heart failure)   Elevated troponin   Primary Cardiologist: Dr. Radford Pax  Patient Profile: 81 yo female w/ hx HTN, HL, DM, and glaucoma, who was admitted 03/24 acute resp failure req BiPAP, CHF, elevated trop, ARF w/ peak Cr 2.00. Cards following for elevated ez, CHF  SUBJECTIVE: Feels like she is breathing OK, no chest pain, has not been OOB much.  OBJECTIVE Filed Vitals:   02/15/15 1300 02/15/15 2307 02/16/15 0519 02/16/15 0955  BP:  145/54 140/67 163/83  Pulse: 80 84 77 81  Temp:  98.2 F (36.8 C) 99 F (37.2 C)   TempSrc:  Oral Oral   Resp: 24 16    Height:      Weight:   163 lb 9.3 oz (74.2 kg)   SpO2: 100% 95% 98%     Intake/Output Summary (Last 24 hours) at 02/16/15 1020 Last data filed at 02/16/15 4944  Gross per 24 hour  Intake    120 ml  Output    600 ml  Net   -480 ml   Filed Weights   02/14/15 0221 02/16/15 0519  Weight: 165 lb (74.844 kg) 163 lb 9.3 oz (74.2 kg)    PHYSICAL EXAM General: Well developed, well nourished, female in no acute distress. Head: Normocephalic, atraumatic.  Neck: Supple without bruits, JVD minimal elevation. Lungs:  Resp regular and unlabored, rales bases. Heart: RRR, S1, S2, + S3, no S4, or murmur; no rub. Abdomen: Soft, non-tender, non-distended, BS + x 4.  Extremities: No clubbing, cyanosis, no edema.  Neuro: Alert and oriented X 3. Moves all extremities spontaneously. Psych: Normal affect.  LABS: CBC: Recent Labs  02/14/15 0217  02/14/15 0705 02/15/15 0247  WBC 14.8*  --  11.8* 9.3  NEUTROABS 11.0*  --  10.6*  --   HGB 10.1*  < > 9.2* 8.1*  HCT 31.7*  < > 28.6* 25.0*  MCV 86.8  --  85.9 85.0  PLT 466*  --  374 331  < > =  values in this interval not displayed. INR: Recent Labs  02/14/15 0217  INR 9.67   Basic Metabolic Panel: Recent Labs  02/15/15 0247 02/16/15 0513  NA 132* 136  K 3.7 4.0  CL 98 99  CO2 26 26  GLUCOSE 171* 133*  BUN 58* 63*  CREATININE 2.00* 1.92*  CALCIUM 8.1* 8.5   Liver Function Tests: Recent Labs  02/14/15 0217 02/14/15 0705  AST 29 30  ALT 24 22  ALKPHOS 58 50  BILITOT 0.6 0.4  PROT 7.3 6.7  ALBUMIN 3.6 3.3*   Cardiac Enzymes: Recent Labs  02/14/15 1118 02/14/15 1637 02/15/15 0930  TROPONINI 1.16* 1.18* 0.56*    Recent Labs  02/14/15 0236  TROPIPOC 0.05   BNP:  B NATRIURETIC PEPTIDE  Date/Time Value Ref Range Status  02/14/2015 02:47 AM 1346.9* 0.0 - 100.0 pg/mL Final   Hemoglobin A1C: Recent Labs  02/14/15 0705  HGBA1C 7.5*   Fasting Lipid Panel: Recent Labs  02/16/15 0513  CHOL 125  HDL 27*  LDLCALC 62  TRIG 180*  CHOLHDL 4.6   Thyroid Function Tests: Recent Labs  02/14/15 0705  TSH 2.035   Anemia Panel: Recent Labs  02/15/15  0247  VITAMINB12 >2000*  FOLATE >20.0  FERRITIN 46  TIBC 253  IRON 19*  RETICCTPCT 1.2    TELE:  SR, seen in nuc med  Radiology/Studies: US Renal 02/15/2015   CLINICAL DATA:  Acute renal failure  EXAM: RENAL/URINARY TRACT ULTRASOUND COMPLETE  COMPARISON:  09/21/2012  FINDINGS: Right Kidney:  Length: 12 cm. Diffuse mild increase in cortical echogenicity with thinning. No hydronephrosis.  Left Kidney:  Length: 10 cm. Diffuse mild increase in cortical echogenicity with thinning. No hydronephrosis  Bladder:  Appears normal for degree of bladder distention.  IMPRESSION: 1. Medical renal disease and mild left renal atrophy. 2. No hydronephrosis.   Electronically Signed   By: Monte Fantasia M.D.   On: 02/15/2015 10:36   Current Medications:  . antiseptic oral rinse  7 mL Mouth Rinse BID  . aspirin EC  81 mg Oral Daily  . atorvastatin  10 mg Oral q1800  . heparin subcutaneous  5,000 Units Subcutaneous  3 times per day  . insulin aspart  0-9 Units Subcutaneous TID WC  . latanoprost  1 drop Both Eyes QHS  . nadolol  40 mg Oral Daily      ASSESSMENT AND PLAN: Principal Problem:   Acute respiratory failure with hypoxia, req BiPAP - improved, per IM - still on O2 by cannula  Active Problems:   Hypertensive urgency - SBP 120s-140s last 24 hours    Diabetes mellitus type 2, uncontrolled - per IM - A1c 7.5%    Normocytic anemia, question iron deficiency with low iron, borderline low ferritin, and low % saturation - denies any BRBPR, melena, hematuria or any other sources of bleeding - hemoccult normal - decrease ASA back to home dose 81mg  daily - further per IM - B12 very elevated    Hyperlipidemia - see profile above - on home dose Lipitor - still has low HDL    Renal failure - felt secondary to acute hypertensive urgency, possible CKD (baseline unknown) - renal US with medical renal disease and mild L renal atrophy  - home ARB on hold - recommend a renal arterial duplex to evaluate for bilateral renal artery stenosis in the setting of kidney disease and acute pulmonary edema    Acute CHF (congestive heart failure) - 2D echo still pending, they will do today - she appears approaching euvolemia  - Lasix d/c'd 03/25 -  Cr slight downward trend, continue to follow. Will probably need resumption of oral diuretic at some point - f/u daily weights - down 2 lbs 48 hr. I/O -2.7L net - continue BB and anticipate change to carvedilol if LV dysfunction is present    Elevated troponin - peak 1.18 - may be due to demand ischemia in the setting of hypertensive urgency and CHF as well as renal insufficiency but has risk factors for underlying cardiac disease - with CKD and anemia patient is not ideal candidate for cath at present time (await nuclear results) - Lexiscan MV as inpatient felt most appropriate test - on DVT prophylaxis only given downtrend of troponin and absence of chest  pain - lipid profile above, low HDL only significant abnormality now     LBBB, unknown chronicity - follow  Signed, Rosaria Ferries , PA-C 10:20 AM 02/16/2015   The patient was seen and examined, and I agree with the assessment and plan as documented above, with modifications as noted below. Pt feeling better this morning. I have changed the note above to reflect my thoughts. Await echo and  nuclear MPI study results. Renal arterial Dopplers as outpatient to evaluate for renal artery stenosis given acute pulmonary edema and CKD. Continue ASA 81 mg. Await study results for further medication adjustments. Hold diuretics today and monitor renal function.

## 2015-02-17 DIAGNOSIS — N184 Chronic kidney disease, stage 4 (severe): Secondary | ICD-10-CM

## 2015-02-17 DIAGNOSIS — I340 Nonrheumatic mitral (valve) insufficiency: Secondary | ICD-10-CM | POA: Diagnosis present

## 2015-02-17 DIAGNOSIS — I5041 Acute combined systolic (congestive) and diastolic (congestive) heart failure: Secondary | ICD-10-CM | POA: Diagnosis present

## 2015-02-17 DIAGNOSIS — R931 Abnormal findings on diagnostic imaging of heart and coronary circulation: Secondary | ICD-10-CM

## 2015-02-17 DIAGNOSIS — I429 Cardiomyopathy, unspecified: Secondary | ICD-10-CM

## 2015-02-17 DIAGNOSIS — I509 Heart failure, unspecified: Secondary | ICD-10-CM

## 2015-02-17 DIAGNOSIS — I519 Heart disease, unspecified: Secondary | ICD-10-CM

## 2015-02-17 LAB — CBC
HCT: 26.7 % — ABNORMAL LOW (ref 36.0–46.0)
Hemoglobin: 8.6 g/dL — ABNORMAL LOW (ref 12.0–15.0)
MCH: 27.4 pg (ref 26.0–34.0)
MCHC: 32.2 g/dL (ref 30.0–36.0)
MCV: 85 fL (ref 78.0–100.0)
PLATELETS: 366 10*3/uL (ref 150–400)
RBC: 3.14 MIL/uL — ABNORMAL LOW (ref 3.87–5.11)
RDW: 13.6 % (ref 11.5–15.5)
WBC: 8.2 10*3/uL (ref 4.0–10.5)

## 2015-02-17 LAB — BASIC METABOLIC PANEL
Anion gap: 12 (ref 5–15)
BUN: 57 mg/dL — AB (ref 6–23)
CALCIUM: 8.6 mg/dL (ref 8.4–10.5)
CO2: 24 mmol/L (ref 19–32)
Chloride: 100 mmol/L (ref 96–112)
Creatinine, Ser: 1.8 mg/dL — ABNORMAL HIGH (ref 0.50–1.10)
GFR calc Af Amer: 30 mL/min — ABNORMAL LOW (ref >90)
GFR, EST NON AFRICAN AMERICAN: 26 mL/min — AB (ref >90)
GLUCOSE: 151 mg/dL — AB (ref 70–99)
Potassium: 3.9 mmol/L (ref 3.5–5.1)
Sodium: 136 mmol/L (ref 135–145)

## 2015-02-17 LAB — GLUCOSE, CAPILLARY
GLUCOSE-CAPILLARY: 156 mg/dL — AB (ref 70–99)
GLUCOSE-CAPILLARY: 276 mg/dL — AB (ref 70–99)
Glucose-Capillary: 200 mg/dL — ABNORMAL HIGH (ref 70–99)
Glucose-Capillary: 261 mg/dL — ABNORMAL HIGH (ref 70–99)

## 2015-02-17 MED ORDER — CARVEDILOL 6.25 MG PO TABS
6.2500 mg | ORAL_TABLET | Freq: Two times a day (BID) | ORAL | Status: DC
  Administered 2015-02-17 – 2015-02-18 (×3): 6.25 mg via ORAL
  Filled 2015-02-17 (×5): qty 1

## 2015-02-17 NOTE — Progress Notes (Signed)
Turn on education video about congestive heart failure. Explained it further and answered patients question. Patient and significant other are ready and eager to learn. Will need more education from MD. For now, all questions answered. No further needs noted.

## 2015-02-17 NOTE — Progress Notes (Addendum)
SUBJECTIVE: Pt is sitting in chair and feeling well, and in good spirits. Denies chest pain, leg swelling, and shortness of breath.     Intake/Output Summary (Last 24 hours) at 02/17/15 0858 Last data filed at 02/16/15 1848  Gross per 24 hour  Intake    360 ml  Output   1500 ml  Net  -1140 ml    Current Facility-Administered Medications  Medication Dose Route Frequency Provider Last Rate Last Dose  . acetaminophen (TYLENOL) tablet 650 mg  650 mg Oral Q6H PRN Rise Patience, MD   650 mg at 02/16/15 1210   Or  . acetaminophen (TYLENOL) suppository 650 mg  650 mg Rectal Q6H PRN Rise Patience, MD      . antiseptic oral rinse (CPC / CETYLPYRIDINIUM CHLORIDE 0.05%) solution 7 mL  7 mL Mouth Rinse BID Bonnielee Haff, MD   7 mL at 02/15/15 2256  . aspirin EC tablet 81 mg  81 mg Oral Daily Dayna N Dunn, PA-C   81 mg at 02/16/15 1210  . atorvastatin (LIPITOR) tablet 10 mg  10 mg Oral q1800 Bonnielee Haff, MD   10 mg at 02/16/15 1831  . docusate sodium (COLACE) capsule 100 mg  100 mg Oral BID Dianne Dun, NP   100 mg at 02/16/15 2217  . heparin injection 5,000 Units  5,000 Units Subcutaneous 3 times per day Eudelia Bunch, RPH   5,000 Units at 02/17/15 0636  . hydrALAZINE (APRESOLINE) injection 10 mg  10 mg Intravenous Q6H PRN Bonnielee Haff, MD      . insulin aspart (novoLOG) injection 0-9 Units  0-9 Units Subcutaneous TID WC Rise Patience, MD   2 Units at 02/17/15 [phone]  . iron polysaccharides (NIFEREX) capsule 150 mg  150 mg Oral Daily Bonnielee Haff, MD   150 mg at 02/16/15 1315  . latanoprost (XALATAN) 0.005 % ophthalmic solution 1 drop  1 drop Both Eyes QHS Rise Patience, MD   1 drop at 02/16/15 2135  . nadolol (CORGARD) tablet 40 mg  40 mg Oral Daily Rise Patience, MD   40 mg at 02/16/15 1219  . ondansetron (ZOFRAN) tablet 4 mg  4 mg Oral Q6H PRN Rise Patience, MD       Or  . ondansetron [facility]) injection 4 mg  4 mg Intravenous Q6H  PRN Rise Patience, MD        Filed Vitals:   02/16/15 1216 02/16/15 1449 02/16/15 2226 02/17/15 0451  BP: 140/49 180/75 142/53 149/51  Pulse: 85     Temp:  97.7 F (36.5 C) 98.5 F (36.9 C) 98 F (36.7 C)  TempSrc:  Oral Oral Oral  Resp:  18 15 12   Height:      Weight:    161 lb 11.2 oz (73.347 kg)  SpO2: 97% 97% 99% 95%    PHYSICAL EXAM General: NAD HEENT: Normal. Neck: No JVD, no thyromegaly.  Lungs: Clear to auscultation bilaterally with normal respiratory effort. CV: Nondisplaced PMI.  Regular rate and rhythm, normal S1/split S2, no S3/S4, no murmur.  No pretibial edema.  Abdomen: Soft, nontender, no distention.  Neurologic: Alert and oriented x 3.  Psych: Normal affect. Musculoskeletal: Normal range of motion. No gross deformities. Extremities: No clubbing or cyanosis.   TELEMETRY: Sinus with LBBB.  LABS: Basic Metabolic Panel:  Recent Labs  02/16/15 0513 02/17/15 0646  NA 136 136  K 4.0 3.9  CL 99 100  CO2 26 24  GLUCOSE 133* 151*  BUN 63* 57*  CREATININE 1.92* 1.80*  CALCIUM 8.5 8.6   Liver Function Tests: No results for input(s): AST, ALT, ALKPHOS, BILITOT, PROT, ALBUMIN in the last 72 hours. No results for input(s): LIPASE, AMYLASE in the last 72 hours. CBC:  Recent Labs  02/15/15 0247 02/17/15 0646  WBC 9.3 8.2  HGB 8.1* 8.6*  HCT 25.0* 26.7*  MCV 85.0 85.0  PLT 331 366   Cardiac Enzymes:  Recent Labs  02/14/15 1118 02/14/15 1637 02/15/15 0930  TROPONINI 1.16* 1.18* 0.56*   BNP: Invalid input(s): POCBNP D-Dimer: No results for input(s): DDIMER in the last 72 hours. Hemoglobin A1C: No results for input(s): HGBA1C in the last 72 hours. Fasting Lipid Panel:  Recent Labs  02/16/15 0513  CHOL 125  HDL 27*  LDLCALC 62  TRIG 180*  CHOLHDL 4.6   Thyroid Function Tests: No results for input(s): TSH, T4TOTAL, T3FREE, THYROIDAB in the last 72 hours.  Invalid input(s): FREET3 Anemia Panel:  Recent Labs  02/15/15 0247   VITAMINB12 >2000*  FOLATE >20.0  FERRITIN 46  TIBC 253  IRON 19*  RETICCTPCT 1.2    RADIOLOGY: Dg Chest 2 View  02/16/2015   CLINICAL DATA:  Pulmonary edema  EXAM: CHEST  2 VIEW  COMPARISON:  02/14/2015  FINDINGS: Cardiomediastinal silhouette is stable. No pulmonary edema. There is small left pleural effusion with left basilar atelectasis or infiltrate.  IMPRESSION: No pulmonary edema. Small left pleural effusion with left basilar atelectasis or infiltrate.   Electronically Signed   By: Lahoma Crocker M.D.   On: 02/16/2015 15:13   US Renal  02/15/2015   CLINICAL DATA:  Acute renal failure  EXAM: RENAL/URINARY TRACT ULTRASOUND COMPLETE  COMPARISON:  09/21/2012  FINDINGS: Right Kidney:  Length: 12 cm. Diffuse mild increase in cortical echogenicity with thinning. No hydronephrosis.  Left Kidney:  Length: 10 cm. Diffuse mild increase in cortical echogenicity with thinning. No hydronephrosis  Bladder:  Appears normal for degree of bladder distention.  IMPRESSION: 1. Medical renal disease and mild left renal atrophy. 2. No hydronephrosis.   Electronically Signed   By: Monte Fantasia M.D.   On: 02/15/2015 10:36   Nm Myocar Multi W/spect W/wall Motion / Ef  02/16/2015   CLINICAL DATA:  81 year old female with a history of elevated troponin.  Cardiovascular risk factors include hypertension, hyperlipidemia.  EXAM: MYOCARDIAL IMAGING WITH SPECT (REST AND PHARMACOLOGIC-STRESS)  GATED LEFT VENTRICULAR WALL MOTION STUDY  LEFT VENTRICULAR EJECTION FRACTION  TECHNIQUE: Standard myocardial SPECT imaging was performed after resting intravenous injection of 10 mCi Tc-43m sestamibi. Subsequently, intravenous infusion of Lexiscan was performed under the supervision of the Cardiology staff. At peak effect of the drug, 30 mCi Tc-56m sestamibi was injected intravenously and standard myocardial SPECT imaging was performed. Quantitative gated imaging was also performed to evaluate left ventricular wall motion, and estimate  left ventricular ejection fraction.  COMPARISON:  None.  FINDINGS: Perfusion: Fixed defect at the anterior septal wall in the mid ventricle. No reversible defect identified.  Wall Motion: Global hypokinesia.  Left Ventricular Ejection Fraction: 22 %  End diastolic volume 361 ml  End systolic volume 443 ml  IMPRESSION: 1. No reversible ischemia, though there is a fixed defect in the anterior septal wall in the mid ventricle.  2. Global hypokinesis.  3. Left ventricular ejection fraction 22%  4. High-risk stress test findings*, defined by the patient's severely abnormal end-diastolic volume and ejection fraction.  *2012 Appropriate Use  Criteria for Coronary Revascularization Focused Update: J Am Coll Cardiol. 6160;73(7):106-269. http://content.airportbarriers.com.aspx?articleid=1201161   Electronically Signed   By: Corrie Mckusick D.O.   On: 02/16/2015 13:37   Dg Chest Port 1 View  02/14/2015   CLINICAL DATA:  Shortness of breath.  EXAM: PORTABLE CHEST - 1 VIEW  COMPARISON:  None.  FINDINGS: The heart is at the upper limits of normal. There is vascular congestion and mild pulmonary edema. No confluent airspace disease. Questionable blunting of both costophrenic angles. No pneumothorax. No acute osseous abnormality.  IMPRESSION: Vascular congestion and pulmonary edema, may reflect CHF. Questionable blunting of the costophrenic angles, may reflect small effusions.   Electronically Signed   By: Jeb Levering M.D.   On: 02/14/2015 03:07      ASSESSMENT AND PLAN:  Acute respiratory failure with hypoxia, req BiPAP -this has greatly improved. No rales by exam.   Hypertensive urgency - SBP 140s-180s last 24 hours -Will d/c nadolol and start carvedilol 6.25 mg bid. This can be titrated up as needed.   Diabetes mellitus type 2, uncontrolled - per IM - A1c 7.5%   Normocytic anemia, question iron deficiency with low iron, borderline low ferritin, and low % saturation - denies any BRBPR, melena,  hematuria or any other sources of bleeding - hemoccult normal - continue ASA 81mg  daily - further per IM - B12 very elevated   Hyperlipidemia - on home dose Lipitor - still has low HDL   Renal failure - felt secondary to acute hypertensive urgency, possible CKD (baseline unknown) - renal US with medical renal disease and mild L renal atrophy  - home ARB on hold (would d/c altogether given renal dysfunction, discussed with patient) - recommend an outpatient renal arterial duplex to evaluate for bilateral renal artery stenosis in the setting of kidney disease and acute pulmonary edema   Acute systolic CHF (congestive heart failure), EF 30-35% -This may be a nonischemic cardiomyopathy. The fixed anteroseptal defect on nuclear MPI study is likely a reflection of underlying left bundle branch block. - she is euvolemic  - Lasix d/c'd 03/25 and I will hold today as well and allow renal function to improve. -Cr slight downward trend, continue to follow. Will probably need resumption of oral diuretic at some point, perhaps 3/28. Will aim to control BP with Coreg. - 1.8 L output in last 48 hrs in spite of being off diuretic. - No ACEI/ARB due to CKD stage IV -Will repeat echo within next several months after medical therapy is optimized to assess for interval improvement. If no improvement, would consider cardiac resynchronization therapy (with defibrillator) given LBBB (wide QRS). -No spironolactone due to CKD stage IV.  Elevated troponin - peak 1.18 - likely due to demand ischemia in the setting of hypertensive urgency and CHF as well as renal insufficiency. -No ischemic territories on nuclear MPI study. The fixed anteroseptal defect on nuclear MPI study is likely a reflection of underlying left bundle branch block. - with CKD and anemia patient is not ideal candidate for coronary angiography and given lack of ischemia, I would not pursue one regardless. - on DVT prophylaxis only given  downtrend of troponin and absence of chest pain - lipid profile previously reviewed, low HDL only significant abnormality now  Moderate to severe functional mitral regurgitation -Will reassess by echo in next several months after medical regimen is optimized, as this may improve if LV function improves.   Kate Sable, M.D., F.A.C.C.

## 2015-02-17 NOTE — Progress Notes (Signed)
Educated pt. On CHF and carb counting. Gave pt. Info/handouts on both. Pt. States that she did not have much understanding about the information that have been told to her from the MD. She stated that I gave her a bit more understanding and will continue to ask more questions as needed. Pt. Family at bedside. Will continue to monitor.

## 2015-02-17 NOTE — Progress Notes (Signed)
[facility] PROGRESS NOTE  Katie Fuentes KWI:[id number] DOB: Nov 25, 1933 DOA: 02/14/2015  PCP: Tommy Medal, MD  Brief HPI: 81 year old Caucasian female with a past medical history of hypertension, hyperlipidemia, diabetes, presented with worsening shortness of breath. According to the patient and her husband this started quite suddenly. She also noticed some swelling in her legs. She was thought to have acute congestive heart failure and was admitted to the hospital for further management. She was initially placed on BiPAP. She was stabilized and transferred to the floor. Cardiology was consulted. Patient underwent stress tests, which did not show any reversible ischemia but did show significantly diminished ejection fraction.  Past medical history:  Past Medical History  Diagnosis Date  . Atrophic vaginitis   . Yeast infection   . Glaucoma   . Cataracts, bilateral   . Diabetes mellitus without complication 12/4266  . Hyperlipidemia   . Hypertension     Consultants: Cardiology  Procedures:  2-D echocardiogram Study Conclusions - Left ventricle: The cavity size was normal. Wall thickness wasnormal. Systolic function was moderately to severely reduced. Theestimated ejection fraction was in the range of 30% to 35%.Diffuse hypokinesis. Doppler parameters are consistent withabnormal left ventricular relaxation (grade 1 diastolicdysfunction). The E/e&' ratio is >15, suggesting elevated LVfilling pressure. - Aortic valve: Trileaflet. Sclerosis without stenosis. There wasmild regurgitation. - Mitral valve: Mildly thickened leaflets . There was moderate tosevere functional regurgitation. - Left atrium: Moderately dilated at 39 ml/m2. - Right ventricle: The cavity size was mildly dilated. - Atrial septum: Mobile IAS - there is a suggestion of small PFO bycolor doppler. - Inferior vena cava: The vessel was normal in size. Therespirophasic diameter changes were in the  normal range (>= 50%),consistent with normal central venous pressure. - Pericardium, extracardiac: There was no pericardial effusion.There was a left pleural effusion. Impressions: - LVEF 30-35%, severe global hypokinesis, diastolic dysfunctionwith elevated LV filling pressure, mild AI, moderate to severefunctional MR, moderate LAE, possible small PFO by color doppler,left pleural effusion.  Nuclear stress test 1. No reversible ischemia, though there is a fixed defect in the anterior septal wall in the mid ventricle. 2. Global hypokinesis. 3. Left ventricular ejection fraction 22% 4. High-risk stress test findings*, defined by the patient's severely abnormal end-diastolic volume and ejection fraction.  Antibiotics: None  Subjective: Patient continues to feel well. Denies any complaints. Her breathing is back to normal. Denies any chest pain.   Objective: Vital Signs  Filed Vitals:   02/16/15 1216 02/16/15 1449 02/16/15 2226 02/17/15 0451  BP: 140/49 180/75 142/53 149/51  Pulse: 85     Temp:  97.7 F (36.5 C) 98.5 F (36.9 C) 98 F (36.7 C)  TempSrc:  Oral Oral Oral  Resp:  18 15 12   Height:      Weight:    73.347 kg (161 lb 11.2 oz)  SpO2: 97% 97% 99% 95%    Intake/Output Summary (Last 24 hours) at 02/17/15 1020 Last data filed at 02/17/15 0930  Gross per 24 hour  Intake    480 ml  Output   1500 ml  Net  -1020 ml   Filed Weights   02/14/15 0221 02/16/15 0519 02/17/15 0451  Weight: 74.844 kg (165 lb) 74.2 kg (163 lb 9.3 oz) 73.347 kg (161 lb 11.2 oz)    General appearance: alert, cooperative, appears stated age and no distress Resp: Improving aeration entry bilaterally. Hardly any crackles noted today. No wheezing. No rhonchi. Cardio: regular rate and rhythm, S1, S2 normal, no murmur, click,  rub or gallop GI: soft, non-tender; bowel sounds normal; no masses,  no organomegaly Extremities: Pedal edema is significantly improved Neurologic: Alert and oriented 3.  No focal neurological deficits are noted.  Lab Results:  Basic Metabolic Panel:  Recent Labs Lab 02/14/15 0217 02/14/15 0228 02/14/15 0705 02/15/15 0247 02/16/15 0513 02/17/15 0646  NA 133* 135 134* 132* 136 136  K 4.0 4.0 3.7 3.7 4.0 3.9  CL 96 98 100 98 99 100  CO2 22  --  22 26 26 24   GLUCOSE 348* 355* 218* 171* 133* 151*  BUN 51* 49* 55* 58* 63* 57*  CREATININE 1.72* 1.60* 1.71* 2.00* 1.92* 1.80*  CALCIUM 8.6  --  8.5 8.1* 8.5 8.6   Liver Function Tests:  Recent Labs Lab 02/14/15 0217 02/14/15 0705  AST 29 30  ALT 24 22  ALKPHOS 58 50  BILITOT 0.6 0.4  PROT 7.3 6.7  ALBUMIN 3.6 3.3*    Recent Labs Lab 02/14/15 0217  LIPASE 59   CBC:  Recent Labs Lab 02/14/15 0217 02/14/15 0228 02/14/15 0705 02/15/15 0247 02/17/15 0646  WBC 14.8*  --  11.8* 9.3 8.2  NEUTROABS 11.0*  --  10.6*  --   --   HGB 10.1* 11.9* 9.2* 8.1* 8.6*  HCT 31.7* 35.0* 28.6* 25.0* 26.7*  MCV 86.8  --  85.9 85.0 85.0  PLT 466*  --  374 331 366   Cardiac Enzymes:  Recent Labs Lab 02/14/15 0705 02/14/15 1118 02/14/15 1637 02/15/15 0930  TROPONINI 0.57* 1.16* 1.18* 0.56*   BNP (last 3 results)  Recent Labs  02/14/15 0247  BNP 1346.9*   CBG:  Recent Labs Lab 02/16/15 0811 02/16/15 1203 02/16/15 1747 02/16/15 2222 02/17/15 0753  GLUCAP 182* 258* 157* 88 156*    Recent Results (from the past 240 hour(s))  MRSA PCR Screening     Status: None   Collection Time: 02/14/15  4:29 AM  Result Value Ref Range Status   MRSA by PCR NEGATIVE NEGATIVE Final    Comment:        The GeneXpert MRSA Assay (FDA approved for NASAL specimens only), is one component of a comprehensive MRSA colonization surveillance program. It is not intended to diagnose MRSA infection nor to guide or monitor treatment for MRSA infections.       Studies/Results: Dg Chest 2 View  02/16/2015   CLINICAL DATA:  Pulmonary edema  EXAM: CHEST  2 VIEW  COMPARISON:  02/14/2015  FINDINGS:  Cardiomediastinal silhouette is stable. No pulmonary edema. There is small left pleural effusion with left basilar atelectasis or infiltrate.  IMPRESSION: No pulmonary edema. Small left pleural effusion with left basilar atelectasis or infiltrate.   Electronically Signed   By: Lahoma Crocker M.D.   On: 02/16/2015 15:13   Nm Myocar Multi W/spect W/wall Motion / Ef  02/16/2015   CLINICAL DATA:  81 year old female with a history of elevated troponin.  Cardiovascular risk factors include hypertension, hyperlipidemia.  EXAM: MYOCARDIAL IMAGING WITH SPECT (REST AND PHARMACOLOGIC-STRESS)  GATED LEFT VENTRICULAR WALL MOTION STUDY  LEFT VENTRICULAR EJECTION FRACTION  TECHNIQUE: Standard myocardial SPECT imaging was performed after resting intravenous injection of 10 mCi Tc-25m sestamibi. Subsequently, intravenous infusion of Lexiscan was performed under the supervision of the Cardiology staff. At peak effect of the drug, 30 mCi Tc-51m sestamibi was injected intravenously and standard myocardial SPECT imaging was performed. Quantitative gated imaging was also performed to evaluate left ventricular wall motion, and estimate left ventricular ejection fraction.  COMPARISON:  None.  FINDINGS: Perfusion: Fixed defect at the anterior septal wall in the mid ventricle. No reversible defect identified.  Wall Motion: Global hypokinesia.  Left Ventricular Ejection Fraction: 22 %  End diastolic volume 269 ml  End systolic volume 485 ml  IMPRESSION: 1. No reversible ischemia, though there is a fixed defect in the anterior septal wall in the mid ventricle.  2. Global hypokinesis.  3. Left ventricular ejection fraction 22%  4. High-risk stress test findings*, defined by the patient's severely abnormal end-diastolic volume and ejection fraction.  *2012 Appropriate Use Criteria for Coronary Revascularization Focused Update: J Am Coll Cardiol. 4627;03(5):009-381. http://content.airportbarriers.com.aspx?articleid=1201161   Electronically  Signed   By: Corrie Mckusick D.O.   On: 02/16/2015 13:37    Medications:  Scheduled: . antiseptic oral rinse  7 mL Mouth Rinse BID  . aspirin EC  81 mg Oral Daily  . atorvastatin  10 mg Oral q1800  . carvedilol  6.25 mg Oral BID WC  . docusate sodium  100 mg Oral BID  . heparin subcutaneous  5,000 Units Subcutaneous 3 times per day  . insulin aspart  0-9 Units Subcutaneous TID WC  . iron polysaccharides  150 mg Oral Daily  . latanoprost  1 drop Both Eyes QHS   Continuous:   WEX:[id number] **OR** acetaminophen, hydrALAZINE, ondansetron **OR** ondansetron (ZOFRAN) IV  Assessment/Plan:  Principal Problem:   Acute respiratory failure with hypoxia Active Problems:   Hypertensive urgency   Diabetes mellitus type 2, uncontrolled   Normocytic anemia   Hyperlipidemia   Renal failure   Acute CHF (congestive heart failure)   Elevated troponin   ARF (acute renal failure)    Acute respiratory failure with hypoxia secondary to decompensated combined acute systolic and diastolic CHF Respiratory failure has resolved. EF based on the nuclear stress test was about 22%. Echocardiogram report is as above. Patient improved with intravenous Lasix. Lasix was held due to climbing creatinine. Patient continues to make adequate amount of urine. Patient symptomatically is stable. Creatinine continues to improve. She is saturating well on room air. Repeat ABG showed improvement respiratory acidosis. Cardiology to determine appropriate dose of oral Lasix tomorrow depending on her renal function.  Possible non-ST elevation MI Troponin was elevated. EKG is abnormal revealing LBBB. No older EKGs available for comparison. Continue aspirin. Intravenous heparin was discontinued. Troponin trending down. Nuclear stress test results as above. No reversible ischemia, but significantly reduced EF. Continue beta blocker, which was changed over to carvedilol. Further management per cardiology  Hypertensive urgency,  now improved Blood pressure was in the 017P to 102 systolic at the time of admission. Improved with intravenous nitroglycerin. She has been weaned off of intravenous nitroglycerin. Hydralazine as needed. Blood pressure is reasonably well controlled. Continue to monitor for now. Cardiology to pursue outpatient testing for renal artery stenosis.  Diabetes mellitus type 2 uncontrolled HbA1c is 7.5. Continue sliding scale coverage for now. She was on oral agents at home. Cannot use metformin from here onwards.  Renal failure, acute on chronic (stage is not known) Baseline creatinine not known. Creatinine had climbed to 2.0. Renal ultrasound does suggest medical renal disease. Creatinine is improving and she is making urine. Continue to hold Lasix. No ACE inhibitor or ARB due to CKD. UA was reviewed. No proteinuria noted. She will benefit from outpatient consultation with nephrology.  Hyperlipidemia Continue statins. LDL 62.  Iron deficiency anemia  Iron was 19 with a ferritin of 46. Folate and B-12 were normal. Hemoglobin remains stable. No overt  bleeding. She reports colonoscopy within the last 5 years. Continue iron supplementation. This will need to be worked up further as an outpatient.   Leukocytosis Probably reactionary. Now normal. Patient is afebrile. Closely follow.  DVT Prophylaxis: Subcutaneous heparin  Code Status: Full code  Family Communication: Discussed with the patient and her husband  Disposition Plan: She is improved. Anticipate discharge tomorrow.    LOS: 3 days   [facility] Pager [phone] 02/17/2015, 10:20 AM  If 7PM-7AM, please contact night-coverage at [email or web address], password [facility]

## 2015-02-18 ENCOUNTER — Telehealth: Payer: Self-pay | Admitting: Physician Assistant

## 2015-02-18 ENCOUNTER — Telehealth: Payer: Self-pay | Admitting: *Deleted

## 2015-02-18 DIAGNOSIS — I10 Essential (primary) hypertension: Secondary | ICD-10-CM

## 2015-02-18 LAB — CBC
HCT: 27.8 % — ABNORMAL LOW (ref 36.0–46.0)
Hemoglobin: 8.9 g/dL — ABNORMAL LOW (ref 12.0–15.0)
MCH: 27.5 pg (ref 26.0–34.0)
MCHC: 32 g/dL (ref 30.0–36.0)
MCV: 85.8 fL (ref 78.0–100.0)
Platelets: 393 10*3/uL (ref 150–400)
RBC: 3.24 MIL/uL — AB (ref 3.87–5.11)
RDW: 13.6 % (ref 11.5–15.5)
WBC: 9.4 10*3/uL (ref 4.0–10.5)

## 2015-02-18 LAB — BASIC METABOLIC PANEL
ANION GAP: 10 (ref 5–15)
BUN: 53 mg/dL — ABNORMAL HIGH (ref 6–23)
CO2: 25 mmol/L (ref 19–32)
Calcium: 8.8 mg/dL (ref 8.4–10.5)
Chloride: 101 mmol/L (ref 96–112)
Creatinine, Ser: 1.69 mg/dL — ABNORMAL HIGH (ref 0.50–1.10)
GFR, EST AFRICAN AMERICAN: 32 mL/min — AB (ref >90)
GFR, EST NON AFRICAN AMERICAN: 28 mL/min — AB (ref >90)
Glucose, Bld: 281 mg/dL — ABNORMAL HIGH (ref 70–99)
Potassium: 4.4 mmol/L (ref 3.5–5.1)
SODIUM: 136 mmol/L (ref 135–145)

## 2015-02-18 LAB — GLUCOSE, CAPILLARY
Glucose-Capillary: 235 mg/dL — ABNORMAL HIGH (ref 70–99)
Glucose-Capillary: 187 mg/dL — ABNORMAL HIGH (ref 70–99)

## 2015-02-18 MED ORDER — FUROSEMIDE 20 MG PO TABS: 20.0000 mg | ORAL_TABLET | Freq: Every day | ORAL | Status: DC

## 2015-02-18 MED ORDER — POLYSACCHARIDE IRON COMPLEX 150 MG PO CAPS: 150.0000 mg | ORAL_CAPSULE | Freq: Two times a day (BID) | ORAL | Status: AC

## 2015-02-18 MED ORDER — SITAGLIPTIN PHOSPHATE 50 MG PO TABS
50.0000 mg | ORAL_TABLET | Freq: Every day | ORAL | Status: DC
Start: 2015-02-18 — End: 2015-05-29

## 2015-02-18 MED ORDER — DOCUSATE SODIUM 100 MG PO CAPS: 100.0000 mg | ORAL_CAPSULE | Freq: Two times a day (BID) | ORAL | Status: DC

## 2015-02-18 MED ORDER — CARVEDILOL 12.5 MG PO TABS: 12.5000 mg | ORAL_TABLET | Freq: Two times a day (BID) | ORAL | Status: DC

## 2015-02-18 NOTE — Progress Notes (Signed)
CARE MANAGEMENT NOTE 02/18/2015  Patient:  Katie Fuentes, Katie Fuentes   Account Number:  [id number]  Date Initiated:  02/18/2015  Documentation initiated by:  [facility]  Subjective/Objective Assessment:   admitted with CHF     Action/Plan:   HHRN for CHF teaching   Anticipated DC Date:  02/18/2015   Anticipated DC Plan:  [place]  CM consult      [facility] Choice  HOME HEALTH   Choice offered to / List presented to:  C-1 Patient        [place] arranged  HH-1 RN      [place].   Status of service:  Completed, signed off Medicare Important Message given?   (If response is "NO", the following Medicare IM given date fields will be blank) Date Medicare IM given:   Medicare IM given by:   Date Additional Medicare IM given:   Additional Medicare IM given by:    Discharge Disposition:  [place]  Per UR Regulation:  Reviewed for med. necessity/level of care/duration of stay  If discussed at [place] of Stay Meetings, dates discussed:    Comments:  02/18/15 Spoke with patient and her husband about [facility] for CHF teaching and monitoring. They selected [facility]. Contacted Miranda at [facility] and set up [facility] for CHF.

## 2015-02-18 NOTE — Discharge Summary (Signed)
[facility]  Physician Discharge Summary   Patient ID: Katie Fuentes MRN: [id number] DOB/AGE: 1934-05-05 81 y.o.  Admit date: 02/14/2015 Discharge date: 02/18/2015  PCP: Tommy Medal, MD  DISCHARGE DIAGNOSES:  Principal Problem:   Acute combined systolic and diastolic congestive heart failure Active Problems:   Acute respiratory failure with hypoxia   Hypertensive urgency   Diabetes mellitus type 2, uncontrolled   Normocytic anemia   Hyperlipidemia   Renal failure   Elevated troponin   ARF (acute renal failure)   Mitral valve regurgitation   RECOMMENDATIONS FOR OUTPATIENT FOLLOW UP: 1. Cardiology to arrange outpatient follow-up for CHF and renal artery duplex 2. Please consider referral to nephrology for outpatient management of CKD 3. Consider further evaluation of iron deficiency anemia.   DISCHARGE CONDITION: fair  Diet recommendation: Modified carbohydrate  Filed Weights   02/16/15 0519 02/17/15 0451 02/18/15 0553  Weight: 74.2 kg (163 lb 9.3 oz) 73.347 kg (161 lb 11.2 oz) 73.211 kg (161 lb 6.4 oz)    INITIAL HISTORY: 81 year old Caucasian female with a past medical history of hypertension, hyperlipidemia, diabetes, presented with worsening shortness of breath. According to the patient and her husband this started quite suddenly. She also noticed some swelling in her legs. She was thought to have acute congestive heart failure and was admitted to the hospital for further management. She was initially placed on BiPAP. She was stabilized and transferred to the floor. Cardiology was consulted. Patient underwent stress tests, which did not show any reversible ischemia but did show significantly diminished ejection fraction.  Consultants: Cardiology  Procedures:  2-D echocardiogram Study Conclusions - Left ventricle: The cavity size was normal. Wall thickness wasnormal. Systolic function was moderately to severely reduced. Theestimated ejection  fraction was in the range of 30% to 35%.Diffuse hypokinesis. Doppler parameters are consistent withabnormal left ventricular relaxation (grade 1 diastolicdysfunction). The E/e&' ratio is >15, suggesting elevated LVfilling pressure. - Aortic valve: Trileaflet. Sclerosis without stenosis. There wasmild regurgitation. - Mitral valve: Mildly thickened leaflets . There was moderate tosevere functional regurgitation. - Left atrium: Moderately dilated at 39 ml/m2. - Right ventricle: The cavity size was mildly dilated. - Atrial septum: Mobile IAS - there is a suggestion of small PFO bycolor doppler. - Inferior vena cava: The vessel was normal in size. Therespirophasic diameter changes were in the normal range (>= 50%),consistent with normal central venous pressure. - Pericardium, extracardiac: There was no pericardial effusion.There was a left pleural effusion. Impressions: - LVEF 30-35%, severe global hypokinesis, diastolic dysfunctionwith elevated LV filling pressure, mild AI, moderate to severefunctional MR, moderate LAE, possible small PFO by color doppler,left pleural effusion.  Nuclear stress test 1. No reversible ischemia, though there is a fixed defect in the anterior septal wall in the mid ventricle. 2. Global hypokinesis. 3. Left ventricular ejection fraction 22% 4. High-risk stress test findings*, defined by the patient's severely abnormal end-diastolic volume and ejection fraction.   HOSPITAL COURSE:   Acute respiratory failure with hypoxia secondary to decompensated combined acute systolic and diastolic CHF Respiratory failure has resolved. EF based on the nuclear stress test was about 22%. Echocardiogram report is as above. Patient improved with intravenous Lasix. Lasix was then held due to climbing creatinine. Patient continues to make adequate amount of urine. Patient symptomatically is stable. Creatinine started improving off of Lasix. She is saturating well on room air.  Repeat ABG showed improvement in respiratory acidosis. Cardiology recommends a dose of 20 mg of Lasix to be taken daily. They will arrange for outpatient  follow-up. No ACE inhibitor or ARB due to CKD.  Possible non-ST elevation MI Troponin was elevated. EKG is abnormal revealing LBBB. No older EKGs available for comparison. Vision was started on aspirin and intravenous heparin. Cardiology saw the patient. Troponin started trending down after peaking at 1.18. Nuclear stress test results as above. No reversible ischemia, but significantly reduced EF. Heparin was subsequently discontinued. Elevated Troponin was either due to demand ischemia. There was no clear indication for cardiac catheterization at this time, especially considering her elevated creatinine. Patient was on a beta blocker at home, which was changed over to carvedilol. Dose has been increased.  Hypertensive urgency, now improved Blood pressure was in the 662H to 476 systolic at the time of admission. Improved with intravenous nitroglycerin. She was weaned off of intravenous nitroglycerin. Blood pressure remains elevated. Heart rate is in the 70s. She'll be discharged on a higher dose of carvedilol. Cardiology to pursue outpatient testing for renal artery stenosis.  Diabetes mellitus type 2 uncontrolled HbA1c is 7.5. She was on oral agents at home. Cannot use metformin from here onwards. She'll be discharged on Januvia and will be asked to continue her glyburide. She will need to follow-up with PCP.  Renal failure, acute on chronic (stage is not known) Baseline creatinine not known. Creatinine had climbed to 2.0 with IV diuretics. Renal ultrasound does suggest medical renal disease. Creatinine is improving and she is making urine. UA was reviewed. No proteinuria noted. She will benefit from outpatient consultation with nephrology.  Hyperlipidemia Continue statins. LDL 62.  Iron deficiency anemia  Iron was 19 with a ferritin of 46.  Folate and B-12 were normal. Hemoglobin remains stable. No overt bleeding. She reports colonoscopy within the last 5 years. He started on iron supplementation. This will need to be worked up further as an outpatient.   Leukocytosis Probably reactionary. Now normal. Patient is afebrile. Closely follow.  Overall she is much improved. Saturating well on room air. Ambulated without difficulties. She is stable for discharge.   PERTINENT LABS:  The results of significant diagnostics from this hospitalization (including imaging, microbiology, ancillary and laboratory) are listed below for reference.    Microbiology: Recent Results (from the past 240 hour(s))  MRSA PCR Screening     Status: None   Collection Time: 02/14/15  4:29 AM  Result Value Ref Range Status   MRSA by PCR NEGATIVE NEGATIVE Final    Comment:        The GeneXpert MRSA Assay (FDA approved for NASAL specimens only), is one component of a comprehensive MRSA colonization surveillance program. It is not intended to diagnose MRSA infection nor to guide or monitor treatment for MRSA infections.      Labs: Basic Metabolic Panel:  Recent Labs Lab 02/14/15 0705 02/15/15 0247 02/16/15 0513 02/17/15 0646 02/18/15 0906  NA 134* 132* 136 136 136  K 3.7 3.7 4.0 3.9 4.4  CL 100 98 99 100 101  CO2 22 26 26 24 25   GLUCOSE 218* 171* 133* 151* 281*  BUN 55* 58* 63* 57* 53*  CREATININE 1.71* 2.00* 1.92* 1.80* 1.69*  CALCIUM 8.5 8.1* 8.5 8.6 8.8   Liver Function Tests:  Recent Labs Lab 02/14/15 0217 02/14/15 0705  AST 29 30  ALT 24 22  ALKPHOS 58 50  BILITOT 0.6 0.4  PROT 7.3 6.7  ALBUMIN 3.6 3.3*    Recent Labs Lab 02/14/15 0217  LIPASE 59   CBC:  Recent Labs Lab 02/14/15 0217 02/14/15 0228 02/14/15 0705 02/15/15  7902 02/17/15 0646 02/18/15 0906  WBC 14.8*  --  11.8* 9.3 8.2 9.4  NEUTROABS 11.0*  --  10.6*  --   --   --   HGB 10.1* 11.9* 9.2* 8.1* 8.6* 8.9*  HCT 31.7* 35.0* 28.6* 25.0* 26.7*  27.8*  MCV 86.8  --  85.9 85.0 85.0 85.8  PLT 466*  --  374 331 366 393   Cardiac Enzymes:  Recent Labs Lab 02/14/15 0705 02/14/15 1118 02/14/15 1637 02/15/15 0930  TROPONINI 0.57* 1.16* 1.18* 0.56*   BNP: BNP (last 3 results)  Recent Labs  02/14/15 0247  BNP 1346.9*   CBG:  Recent Labs Lab 02/17/15 1138 02/17/15 1644 02/17/15 2209 02/18/15 0805 02/18/15 1159  GLUCAP 200* 276* 261* 235* 187*     IMAGING STUDIES Dg Chest 2 View  02/16/2015   CLINICAL DATA:  Pulmonary edema  EXAM: CHEST  2 VIEW  COMPARISON:  02/14/2015  FINDINGS: Cardiomediastinal silhouette is stable. No pulmonary edema. There is small left pleural effusion with left basilar atelectasis or infiltrate.  IMPRESSION: No pulmonary edema. Small left pleural effusion with left basilar atelectasis or infiltrate.   Electronically Signed   By: Lahoma Crocker M.D.   On: 02/16/2015 15:13   US Renal  02/15/2015   CLINICAL DATA:  Acute renal failure  EXAM: RENAL/URINARY TRACT ULTRASOUND COMPLETE  COMPARISON:  09/21/2012  FINDINGS: Right Kidney:  Length: 12 cm. Diffuse mild increase in cortical echogenicity with thinning. No hydronephrosis.  Left Kidney:  Length: 10 cm. Diffuse mild increase in cortical echogenicity with thinning. No hydronephrosis  Bladder:  Appears normal for degree of bladder distention.  IMPRESSION: 1. Medical renal disease and mild left renal atrophy. 2. No hydronephrosis.   Electronically Signed   By: Monte Fantasia M.D.   On: 02/15/2015 10:36   Nm Myocar Multi W/spect W/wall Motion / Ef  02/16/2015   CLINICAL DATA:  81 year old female with a history of elevated troponin.  Cardiovascular risk factors include hypertension, hyperlipidemia.  EXAM: MYOCARDIAL IMAGING WITH SPECT (REST AND PHARMACOLOGIC-STRESS)  GATED LEFT VENTRICULAR WALL MOTION STUDY  LEFT VENTRICULAR EJECTION FRACTION  TECHNIQUE: Standard myocardial SPECT imaging was performed after resting intravenous injection of 10 mCi Tc-74m  sestamibi. Subsequently, intravenous infusion of Lexiscan was performed under the supervision of the Cardiology staff. At peak effect of the drug, 30 mCi Tc-3m sestamibi was injected intravenously and standard myocardial SPECT imaging was performed. Quantitative gated imaging was also performed to evaluate left ventricular wall motion, and estimate left ventricular ejection fraction.  COMPARISON:  None.  FINDINGS: Perfusion: Fixed defect at the anterior septal wall in the mid ventricle. No reversible defect identified.  Wall Motion: Global hypokinesia.  Left Ventricular Ejection Fraction: 22 %  End diastolic volume 409 ml  End systolic volume 735 ml  IMPRESSION: 1. No reversible ischemia, though there is a fixed defect in the anterior septal wall in the mid ventricle.  2. Global hypokinesis.  3. Left ventricular ejection fraction 22%  4. High-risk stress test findings*, defined by the patient's severely abnormal end-diastolic volume and ejection fraction.  *2012 Appropriate Use Criteria for Coronary Revascularization Focused Update: J Am Coll Cardiol. 3299;24(2):683-419. http://content.[email or web address].aspx?articleid=1201161   Electronically Signed   By: Corrie Mckusick D.O.   On: 02/16/2015 13:37   Dg Chest Port 1 View  02/14/2015   CLINICAL DATA:  Shortness of breath.  EXAM: PORTABLE CHEST - 1 VIEW  COMPARISON:  None.  FINDINGS: The heart is at the upper limits of normal. There  is vascular congestion and mild pulmonary edema. No confluent airspace disease. Questionable blunting of both costophrenic angles. No pneumothorax. No acute osseous abnormality.  IMPRESSION: Vascular congestion and pulmonary edema, may reflect CHF. Questionable blunting of the costophrenic angles, may reflect small effusions.   Electronically Signed   By: Jeb Levering M.D.   On: 02/14/2015 03:07    DISCHARGE EXAMINATION: Filed Vitals:   02/17/15 1435 02/17/15 1722 02/17/15 2142 02/18/15 0553  BP: 139/51 157/65 164/61  162/65  Pulse: 79 78 81 77  Temp: 97.8 F (36.6 C)  98 F (36.7 C) 98.2 F (36.8 C)  TempSrc: Oral  Oral Oral  Resp: 16  18 18   Height:      Weight:    73.211 kg (161 lb 6.4 oz)  SpO2: 94% 98% 98% 95%   General appearance: alert, cooperative, appears stated age and no distress Resp: clear to auscultation bilaterally Cardio: regular rate and rhythm, S1, S2 normal, no murmur, click, rub or gallop  DISPOSITION: Home  Discharge Instructions    Call MD for:  difficulty breathing, headache or visual disturbances    Complete by:  As directed      Call MD for:  extreme fatigue    Complete by:  As directed      Call MD for:  persistant dizziness or light-headedness    Complete by:  As directed      Call MD for:  severe uncontrolled pain    Complete by:  As directed      Diet - low sodium heart healthy    Complete by:  As directed      Diet Carb Modified    Complete by:  As directed      Increase activity slowly    Complete by:  As directed            ALLERGIES:  Allergies  Allergen Reactions  . Alphagan [Brimonidine] Other (See Comments)    Redness in the eyes  . Azopt [Brinzolamide]     Redness in the eyes  . Cefdinir Diarrhea  . Macrobid [Nitrofurantoin Macrocrystal] Nausea And Vomiting    Fever, Chills  . Sulfa Antibiotics Nausea And Vomiting and Other (See Comments)    Fever,chills     Discharge Medication List as of 02/18/2015 12:27 PM    START taking these medications   Details  carvedilol (COREG) 12.5 MG tablet Take 1 tablet (12.5 mg total) by mouth 2 (two) times daily with a meal., Starting 02/18/2015, Until Discontinued, Print    docusate sodium (COLACE) 100 MG capsule Take 1 capsule (100 mg total) by mouth 2 (two) times daily., Starting 02/18/2015, Until Discontinued, Print    furosemide (LASIX) 20 MG tablet Take 1 tablet (20 mg total) by mouth daily., Starting 02/18/2015, Until Discontinued, Print    iron polysaccharides (NIFEREX) 150 MG capsule Take 1  capsule (150 mg total) by mouth 2 (two) times daily., Starting 02/18/2015, Until Discontinued, Print    sitaGLIPtin (JANUVIA) 50 MG tablet Take 1 tablet (50 mg total) by mouth daily., Starting 02/18/2015, Until Discontinued, Print      CONTINUE these medications which have NOT CHANGED   Details  Ascorbic Acid (VITAMIN C) 1000 MG tablet Take 1,000 mg by mouth daily., Until Discontinued, Historical Med    aspirin 81 MG tablet Take 81 mg by mouth daily., Until Discontinued, Historical Med    atorvastatin (LIPITOR) 10 MG tablet Take 10 mg by mouth daily., Until Discontinued, Historical Med    beta  carotene 10000 UNIT capsule Take 10,000 Units by mouth daily., Until Discontinued, Historical Med    calcium citrate (CALCITRATE - DOSED IN MG ELEMENTAL CALCIUM) 950 MG tablet Take 1 tablet by mouth daily., Until Discontinued, Historical Med    cholecalciferol (VITAMIN D) 1000 UNITS tablet Take 1,000 Units by mouth daily., Until Discontinued, Historical Med    conjugated estrogens (PREMARIN) vaginal cream Place vaginally 2 (two) times a week., Starting 05/24/2014, Until Discontinued, Normal    glucosamine-chondroitin 500-400 MG tablet Take 1 tablet by mouth 3 (three) times daily., Until Discontinued, Historical Med    glyBURIDE (DIABETA) 5 MG tablet Take 5 mg by mouth 2 (two) times daily with a meal. , Until Discontinued, Historical Med    L-Methylfolate-B6-B12 (METANX PO) Take by mouth 2 (two) times daily., Until Discontinued, Historical Med    Probiotic Product (PROBIOTIC DAILY PO) Take by mouth. Florastor, Until Discontinued, Historical Med    Travoprost, BAK Free, (TRAVATAN) 0.004 % SOLN ophthalmic solution Place 1 drop into both eyes at bedtime., Until Discontinued, Historical Med    vitamin E 400 UNIT capsule Take 400 Units by mouth daily., Until Discontinued, Historical Med    ACCU-CHEK AVIVA PLUS test strip Historical Med      STOP taking these medications      nadolol-bendroflumethiazide (CORZIDE) 40-5 MG per tablet      sitaGLIPtan-metformin (JANUMET) 50-1000 MG per tablet      telmisartan (MICARDIS) 80 MG tablet        Follow-up Information    Follow up with Tommy Medal, MD. Schedule an appointment as soon as possible for a visit on 02/26/2015.   Specialty:  Internal Medicine   Why:  post hospitalization follow up and for diabete Appointmennt is on 02/26/15 at 11:30   Contact information:   [address], [place] [address] [phone]       Follow up with Truitt Merle, NP On 03/01/2015.   Specialty:  Nurse Practitioner   Why:  8:00am. Cardiology followup.   Contact information:   [place] [place] [address] [phone]       Follow up with [facility].   Why:  They will contact you to schedule home nurse visit.    Contact information:   [address] [place] [address] [phone]       TOTAL DISCHARGE TIME: 80 mins  [facility] Pager [phone]  02/18/2015, 3:44 PM

## 2015-02-18 NOTE — Progress Notes (Signed)
SUBJECTIVE:  Patient is feeling better.   Filed Vitals:   02/17/15 1435 02/17/15 1722 02/17/15 2142 02/18/15 0553  BP: 139/51 157/65 164/61 162/65  Pulse: 79 78 81 77  Temp: 97.8 F (36.6 C)  98 F (36.7 C) 98.2 F (36.8 C)  TempSrc: Oral  Oral Oral  Resp: 16  18 18   Height:      Weight:    161 lb 6.4 oz (73.211 kg)  SpO2: 94% 98% 98% 95%     Intake/Output Summary (Last 24 hours) at 02/18/15 1109 Last data filed at 02/18/15 0559  Gross per 24 hour  Intake      0 ml  Output   1600 ml  Net  -1600 ml    LABS: Basic Metabolic Panel:  Recent Labs  02/17/15 0646 02/18/15 0906  NA 136 136  K 3.9 4.4  CL 100 101  CO2 24 25  GLUCOSE 151* 281*  BUN 57* 53*  CREATININE 1.80* 1.69*  CALCIUM 8.6 8.8   Liver Function Tests: No results for input(s): AST, ALT, ALKPHOS, BILITOT, PROT, ALBUMIN in the last 72 hours. No results for input(s): LIPASE, AMYLASE in the last 72 hours. CBC:  Recent Labs  02/17/15 0646 02/18/15 0906  WBC 8.2 9.4  HGB 8.6* 8.9*  HCT 26.7* 27.8*  MCV 85.0 85.8  PLT 366 393   Cardiac Enzymes: No results for input(s): CKTOTAL, CKMB, CKMBINDEX, TROPONINI in the last 72 hours. BNP: Invalid input(s): POCBNP D-Dimer: No results for input(s): DDIMER in the last 72 hours. Hemoglobin A1C: No results for input(s): HGBA1C in the last 72 hours. Fasting Lipid Panel:  Recent Labs  02/16/15 0513  CHOL 125  HDL 27*  LDLCALC 62  TRIG 180*  CHOLHDL 4.6   Thyroid Function Tests: No results for input(s): TSH, T4TOTAL, T3FREE, THYROIDAB in the last 72 hours.  Invalid input(s): FREET3  RADIOLOGY: Dg Chest 2 View  02/16/2015   CLINICAL DATA:  Pulmonary edema  EXAM: CHEST  2 VIEW  COMPARISON:  02/14/2015  FINDINGS: Cardiomediastinal silhouette is stable. No pulmonary edema. There is small left pleural effusion with left basilar atelectasis or infiltrate.  IMPRESSION: No pulmonary edema. Small left pleural effusion with left basilar atelectasis or  infiltrate.   Electronically Signed   By: Lahoma Crocker M.D.   On: 02/16/2015 15:13   US Renal  02/15/2015   CLINICAL DATA:  Acute renal failure  EXAM: RENAL/URINARY TRACT ULTRASOUND COMPLETE  COMPARISON:  09/21/2012  FINDINGS: Right Kidney:  Length: 12 cm. Diffuse mild increase in cortical echogenicity with thinning. No hydronephrosis.  Left Kidney:  Length: 10 cm. Diffuse mild increase in cortical echogenicity with thinning. No hydronephrosis  Bladder:  Appears normal for degree of bladder distention.  IMPRESSION: 1. Medical renal disease and mild left renal atrophy. 2. No hydronephrosis.   Electronically Signed   By: Monte Fantasia M.D.   On: 02/15/2015 10:36   Nm Myocar Multi W/spect W/wall Motion / Ef  02/16/2015   CLINICAL DATA:  81 year old female with a history of elevated troponin.  Cardiovascular risk factors include hypertension, hyperlipidemia.  EXAM: MYOCARDIAL IMAGING WITH SPECT (REST AND PHARMACOLOGIC-STRESS)  GATED LEFT VENTRICULAR WALL MOTION STUDY  LEFT VENTRICULAR EJECTION FRACTION  TECHNIQUE: Standard myocardial SPECT imaging was performed after resting intravenous injection of 10 mCi Tc-23m sestamibi. Subsequently, intravenous infusion of Lexiscan was performed under the supervision of the Cardiology staff. At peak effect of the drug, 30 mCi Tc-52m sestamibi was injected intravenously and standard  myocardial SPECT imaging was performed. Quantitative gated imaging was also performed to evaluate left ventricular wall motion, and estimate left ventricular ejection fraction.  COMPARISON:  None.  FINDINGS: Perfusion: Fixed defect at the anterior septal wall in the mid ventricle. No reversible defect identified.  Wall Motion: Global hypokinesia.  Left Ventricular Ejection Fraction: 22 %  End diastolic volume 546 ml  End systolic volume 503 ml  IMPRESSION: 1. No reversible ischemia, though there is a fixed defect in the anterior septal wall in the mid ventricle.  2. Global hypokinesis.  3. Left  ventricular ejection fraction 22%  4. High-risk stress test findings*, defined by the patient's severely abnormal end-diastolic volume and ejection fraction.  *2012 Appropriate Use Criteria for Coronary Revascularization Focused Update: J Am Coll Cardiol. 5465;68(1):275-170. http://content.airportbarriers.com.aspx?articleid=1201161   Electronically Signed   By: Corrie Mckusick D.O.   On: 02/16/2015 13:37   Dg Chest Port 1 View  02/14/2015   CLINICAL DATA:  Shortness of breath.  EXAM: PORTABLE CHEST - 1 VIEW  COMPARISON:  None.  FINDINGS: The heart is at the upper limits of normal. There is vascular congestion and mild pulmonary edema. No confluent airspace disease. Questionable blunting of both costophrenic angles. No pneumothorax. No acute osseous abnormality.  IMPRESSION: Vascular congestion and pulmonary edema, may reflect CHF. Questionable blunting of the costophrenic angles, may reflect small effusions.   Electronically Signed   By: Jeb Levering M.D.   On: 02/14/2015 03:07    PHYSICAL EXAM   patient is oriented to person time and place. Affect is normal. There is a family member in the room. Head is atraumatic. Sclera and conjunctiva are normal. There is no jugular venous distention. Lungs are clear. Respiratory effort is not labored. Cardiac exam reveals an S1 and S2. The abdomen is soft. There is trace peripheral edema.   TELEMETRY: I have reviewed telemetry today February 18, 2015. There is normal sinus rhythm.   ASSESSMENT AND PLAN:    Acute combined systolic and diastolic congestive heart failure    Renal function has improved somewhat. Would recommend just a small diuretic dose at home. I've written for 20 mg of Lasix daily. We will arrange for early post hospital follow-up. From the cardiology viewpoint. The patient is stable to go home.  Active Problems:   Acute respiratory failure with hypoxia   Hypertensive urgency   Diabetes mellitus type 2, uncontrolled   Normocytic anemia    Hyperlipidemia   Renal failure   Elevated troponin   ARF (acute renal failure)   Mitral valve regurgitation   Dola Argyle 02/18/2015 11:09 AM

## 2015-02-18 NOTE — Telephone Encounter (Signed)
TOC, Pt with CHF. Pt will need to be called. Scheduled to see Cecille Rubin next Friday.

## 2015-02-18 NOTE — Discharge Instructions (Signed)

## 2015-02-18 NOTE — Telephone Encounter (Signed)
Patient discharged today, needs outpatient renal artery duplex for hypertension. Please arrange and call patient. Fender Herder PA-C

## 2015-02-18 NOTE — Progress Notes (Signed)
Nsg Discharge Note  Admit Date:  02/14/2015 Discharge date: 02/18/2015   Katie Fuentes to be D/C'd Home with [facility] to follow up on CHF teaching per MD order.  AVS completed.  Copy for chart, and copy for patient signed, and dated. Patient/caregiver able to verbalize understanding.  Discharge Medication:   Medication List    STOP taking these medications        nadolol-bendroflumethiazide 40-5 MG per tablet  Commonly known as:  CORZIDE     sitaGLIPtin-metformin 50-1000 MG per tablet  Commonly known as:  JANUMET     telmisartan 80 MG tablet  Commonly known as:  MICARDIS      TAKE these medications        ACCU-CHEK AVIVA PLUS test strip  Generic drug:  glucose blood     aspirin 81 MG tablet  Take 81 mg by mouth daily.     atorvastatin 10 MG tablet  Commonly known as:  LIPITOR  Take 10 mg by mouth daily.     beta carotene 10000 UNIT capsule  Take 10,000 Units by mouth daily.     calcium citrate 950 MG tablet  Commonly known as:  CALCITRATE - dosed in mg elemental calcium  Take 1 tablet by mouth daily.     carvedilol 12.5 MG tablet  Commonly known as:  COREG  Take 1 tablet (12.5 mg total) by mouth 2 (two) times daily with a meal.     cholecalciferol 1000 UNITS tablet  Commonly known as:  VITAMIN D  Take 1,000 Units by mouth daily.     conjugated estrogens vaginal cream  Commonly known as:  PREMARIN  Place vaginally 2 (two) times a week.     docusate sodium 100 MG capsule  Commonly known as:  COLACE  Take 1 capsule (100 mg total) by mouth 2 (two) times daily.     furosemide 20 MG tablet  Commonly known as:  LASIX  Take 1 tablet (20 mg total) by mouth daily.     glucosamine-chondroitin 500-400 MG tablet  Take 1 tablet by mouth 3 (three) times daily.     glyBURIDE 5 MG tablet  Commonly known as:  DIABETA  Take 5 mg by mouth 2 (two) times daily with a meal.     iron polysaccharides 150 MG capsule  Commonly known as:  NIFEREX  Take 1 capsule (150 mg  total) by mouth 2 (two) times daily.     METANX PO  Take by mouth 2 (two) times daily.     PROBIOTIC DAILY PO  Take by mouth. Florastor     sitaGLIPtin 50 MG tablet  Commonly known as:  JANUVIA  Take 1 tablet (50 mg total) by mouth daily.     Travoprost (BAK Free) 0.004 % Soln ophthalmic solution  Commonly known as:  TRAVATAN  Place 1 drop into both eyes at bedtime.     vitamin C 1000 MG tablet  Take 1,000 mg by mouth daily.     vitamin E 400 UNIT capsule  Take 400 Units by mouth daily.        Discharge Assessment: Filed Vitals:   02/18/15 0553  BP: 162/65  Pulse: 77  Temp: 98.2 F (36.8 C)  Resp: 18   Skin clean, dry and intact without evidence of skin break down, no evidence of skin tears noted. IV catheter discontinued intact. Site without signs and symptoms of complications - no redness or edema noted at insertion site, patient denies c/o pain -  only slight tenderness at site.  Dressing with slight pressure applied.  D/c Instructions-Education: Discharge instructions given to patient/family with verbalized understanding. D/c education completed with patient/family including follow up instructions, medication list, d/c activities limitations if indicated, with other d/c instructions as indicated by MD - patient able to verbalize understanding, all questions fully answered.Also, patient ambulated before discharge and remained at 96% room air the entire time.  Patient instructed to return to ED, call 911, or call MD for any changes in condition.  Patient escorted via [place], and D/C home via private auto.  Dayle Points, RN 02/18/2015 1:41 PM

## 2015-02-19 NOTE — Telephone Encounter (Signed)
Patient contacted regarding discharge from [facility] on 3/28  Patient understands to follow up with provider ? 4/8 @ 8 am with Truitt Merle Patient understands discharge instructions? Yes  Patient understands medications and regiment? Yes  Patient understands to bring all medications to this visit? Yes  Pt has no questions at this time.

## 2015-02-19 NOTE — Telephone Encounter (Signed)
Left message to call back  

## 2015-02-22 DIAGNOSIS — E119 Type 2 diabetes mellitus without complications: Secondary | ICD-10-CM | POA: Diagnosis not present

## 2015-02-22 DIAGNOSIS — I10 Essential (primary) hypertension: Secondary | ICD-10-CM | POA: Diagnosis not present

## 2015-02-22 DIAGNOSIS — E785 Hyperlipidemia, unspecified: Secondary | ICD-10-CM | POA: Diagnosis not present

## 2015-02-22 DIAGNOSIS — I509 Heart failure, unspecified: Secondary | ICD-10-CM | POA: Diagnosis not present

## 2015-02-26 DIAGNOSIS — E119 Type 2 diabetes mellitus without complications: Secondary | ICD-10-CM | POA: Diagnosis not present

## 2015-02-26 DIAGNOSIS — R7989 Other specified abnormal findings of blood chemistry: Secondary | ICD-10-CM | POA: Diagnosis not present

## 2015-02-26 DIAGNOSIS — I509 Heart failure, unspecified: Secondary | ICD-10-CM | POA: Diagnosis not present

## 2015-02-28 ENCOUNTER — Encounter (HOSPITAL_COMMUNITY): Payer: Medicare Other

## 2015-02-28 DIAGNOSIS — I10 Essential (primary) hypertension: Secondary | ICD-10-CM | POA: Diagnosis not present

## 2015-02-28 DIAGNOSIS — E119 Type 2 diabetes mellitus without complications: Secondary | ICD-10-CM | POA: Diagnosis not present

## 2015-02-28 DIAGNOSIS — E785 Hyperlipidemia, unspecified: Secondary | ICD-10-CM | POA: Diagnosis not present

## 2015-02-28 DIAGNOSIS — I509 Heart failure, unspecified: Secondary | ICD-10-CM | POA: Diagnosis not present

## 2015-03-01 ENCOUNTER — Telehealth: Payer: Self-pay | Admitting: Nurse Practitioner

## 2015-03-01 ENCOUNTER — Ambulatory Visit (INDEPENDENT_AMBULATORY_CARE_PROVIDER_SITE_OTHER): Payer: Medicare Other | Admitting: Nurse Practitioner

## 2015-03-01 ENCOUNTER — Encounter: Payer: Self-pay | Admitting: Nurse Practitioner

## 2015-03-01 VITALS — BP 156/70 | HR 98 | Resp 18 | Ht 63.0 in | Wt 161.0 lb

## 2015-03-01 DIAGNOSIS — I10 Essential (primary) hypertension: Secondary | ICD-10-CM | POA: Diagnosis not present

## 2015-03-01 DIAGNOSIS — I5022 Chronic systolic (congestive) heart failure: Secondary | ICD-10-CM

## 2015-03-01 DIAGNOSIS — N189 Chronic kidney disease, unspecified: Secondary | ICD-10-CM | POA: Diagnosis not present

## 2015-03-01 LAB — BASIC METABOLIC PANEL
BUN: 39 mg/dL — ABNORMAL HIGH (ref 6–23)
CO2: 30 mEq/L (ref 19–32)
Calcium: 9.6 mg/dL (ref 8.4–10.5)
Chloride: 98 mEq/L (ref 96–112)
Creatinine, Ser: 1.57 mg/dL — ABNORMAL HIGH (ref 0.40–1.20)
GFR: 33.77 mL/min — ABNORMAL LOW (ref >60.00)
Glucose, Bld: 312 mg/dL — ABNORMAL HIGH (ref 70–99)
Potassium: 3.6 mEq/L (ref 3.5–5.1)
Sodium: 136 mEq/L (ref 135–145)

## 2015-03-01 LAB — BRAIN NATRIURETIC PEPTIDE: Pro B Natriuretic peptide (BNP): 742 pg/mL — ABNORMAL HIGH (ref 0.0–100.0)

## 2015-03-01 LAB — CBC
HCT: 29 % — ABNORMAL LOW (ref 36.0–46.0)
Hemoglobin: 9.6 g/dL — ABNORMAL LOW (ref 12.0–15.0)
MCHC: 33.2 g/dL (ref 30.0–36.0)
MCV: 85.2 fl (ref 78.0–100.0)
Platelets: 283 10*3/uL (ref 150.0–400.0)
RBC: 3.4 Mil/uL — ABNORMAL LOW (ref 3.87–5.11)
RDW: 15.4 % (ref 11.5–15.5)
WBC: 8.4 10*3/uL (ref 4.0–10.5)

## 2015-03-01 MED ORDER — AMLODIPINE BESYLATE 2.5 MG PO TABS: 2.5000 mg | ORAL_TABLET | Freq: Every day | ORAL | Status: DC

## 2015-03-01 MED ORDER — CARVEDILOL 12.5 MG PO TABS: 18.7500 mg | ORAL_TABLET | Freq: Two times a day (BID) | ORAL | Status: DC

## 2015-03-01 NOTE — Telephone Encounter (Signed)
Called patient back with lab results. Per Truitt Merle NP, labs are improving, blood count and kidney function looks a little better. Fluid level test for heart failure is better than it was, but sill elevated. Patient will start taking 40 mg of her Lasix for three days and then go back to  20 mg, her usual does. Informed patient that her blood sugar was pretty high at her visit today. Patient verbalized understanding.

## 2015-03-01 NOTE — Progress Notes (Addendum)
CARDIOLOGY OFFICE NOTE  Date:  03/01/2015    Katie Fuentes Date of Birth: November 13, 1934 Medical Record #[id number]  PCP:  Tommy Medal, MD  Cardiologist:  Radford Pax    Chief Complaint  Patient presents with  . Congestive Heart Failure    TOC/post hospital - seen for Dr. Radford Pax     History of Present Illness: Katie Fuentes is a 80 y.o. female who presents today for a post hospital/TOC visit. Seen for Dr. Radford Pax. She a history of hypertension, hyperlipidemia, and diabetes mellitus type 2.  Sh  presented to the ER because of worsening shortness of breath. Found to be quite hypertensive with blood pressure more than 309 systolic with chest x-ray showing congestion. Patient was started on nitroglycerin infusion and a small dose of Lasix IV was given. Patient also was given IV enalapril. Patient was initially placed on BiPAP. Patient's blood pressure improved and her symptoms improved. BNP was elevated at 1346 c/w acute CHF. Initial troponin is elevated at 0.57 and EKG shows NSR with LBBB with no old EKG to compare. Echo showed severe LV dysfunction with moderate to severe MR. No ACE/ARB due to CKD.   Needs outpatient renal duplex. She is to have follow up echo in the next few months to reassess her MR.   Comes in today. Here with her husband. She feels like she is doing ok. She had "distressed breathing" all day yesterday - got better last night. Not clear as to why. Weight is stable. No swelling. She feels ok today. BP remains quite elevated here and at home. No chest pain. Understands need to restrict her salt.   Past Medical History  Diagnosis Date  . Atrophic vaginitis   . Yeast infection   . Glaucoma   . Cataracts, bilateral   . Diabetes mellitus without complication 02/767  . Hyperlipidemia   . Hypertension     Past Surgical History  Procedure Laterality Date  . Vaginal delivery      x3  . Colonoscopy  05/2003  . Finger surgery    . Cataract extraction w/  intraocular lens implant Right 2011    1 year later the left side done  . Colonoscopy  06/2008    Dr. Lajoyce Corners recheck in 5 years  . Total vaginal hysterectomy  1978    for endometriosis  . Appendectomy  1950  . Squamous cell carcinoma excision  6/15    Nose     Medications: Current Outpatient Prescriptions  Medication Sig Dispense Refill  . ACCU-CHEK AVIVA PLUS test strip     . Ascorbic Acid (VITAMIN C) 1000 MG tablet Take 1,000 mg by mouth daily.    Marland Kitchen aspirin 81 MG tablet Take 81 mg by mouth daily.    Marland Kitchen atorvastatin (LIPITOR) 10 MG tablet Take 10 mg by mouth daily.    . beta carotene 10000 UNIT capsule Take 10,000 Units by mouth daily.    . calcium citrate (CALCITRATE - DOSED IN MG ELEMENTAL CALCIUM) 950 MG tablet Take 1 tablet by mouth daily.    . carvedilol (COREG) 12.5 MG tablet Take 1 tablet (12.5 mg total) by mouth 2 (two) times daily with a meal. 60 tablet 1  . cholecalciferol (VITAMIN D) 1000 UNITS tablet Take 1,000 Units by mouth daily.    Marland Kitchen conjugated estrogens (PREMARIN) vaginal cream Place vaginally 2 (two) times a week. 42.5 g 3  . docusate sodium (COLACE) 100 MG capsule Take 1 capsule (100 mg total) by mouth 2 (  two) times daily. 60 capsule 0  . furosemide (LASIX) 20 MG tablet Take 1 tablet (20 mg total) by mouth daily. 30 tablet 1  . glucosamine-chondroitin 500-400 MG tablet Take 1 tablet by mouth 3 (three) times daily.    Marland Kitchen glyBURIDE (DIABETA) 5 MG tablet Take 5 mg by mouth 2 (two) times daily with a meal.     . iron polysaccharides (NIFEREX) 150 MG capsule Take 1 capsule (150 mg total) by mouth 2 (two) times daily. 60 capsule 2  . L-Methylfolate-B6-B12 (METANX PO) Take by mouth 2 (two) times daily.    . Probiotic Product (PROBIOTIC DAILY PO) Take by mouth. Florastor    . sitaGLIPtin (JANUVIA) 50 MG tablet Take 1 tablet (50 mg total) by mouth daily. 30 tablet 2  . Travoprost, BAK Free, (TRAVATAN) 0.004 % SOLN ophthalmic solution Place 1 drop into both eyes at bedtime.    .  vitamin E 400 UNIT capsule Take 400 Units by mouth daily.     No current facility-administered medications for this visit.    Allergies: Allergies  Allergen Reactions  . Alphagan [Brimonidine] Other (See Comments)    Redness in the eyes  . Azopt [Brinzolamide]     Redness in the eyes  . Cefdinir Diarrhea  . Macrobid [Nitrofurantoin Macrocrystal] Nausea And Vomiting    Fever, Chills  . Sulfa Antibiotics Nausea And Vomiting and Other (See Comments)    Fever,chills    Social History: The patient  reports that she has quit smoking. Her smoking use included Cigarettes. She has a .2 pack-year smoking history. She has never used smokeless tobacco. She reports that she does not drink alcohol or use illicit drugs.   Family History: The patient's family history includes CAD in her brother; Heart disease (age of onset: 44) in an other family member; Hypertension in her brother.   Review of Systems: Please see the history of present illness.   Otherwise, the review of systems is positive for blurred vision. She has neuropathy in her feet   All other systems are reviewed and negative.   Physical Exam: VS:  BP 156/70 mmHg  Pulse 98  Resp 18  Ht 5\' 3"  (1.6 m)  Wt 161 lb (73.029 kg)  BMI 28.53 kg/m2  SpO2 97%  LMP 07/24/1977 .  BMI Body mass index is 28.53 kg/(m^2).  Wt Readings from Last 3 Encounters:  03/01/15 161 lb (73.029 kg)  02/18/15 161 lb 6.4 oz (73.211 kg)  05/24/14 166 lb (75.297 kg)    General: Pleasant. Well developed, well nourished and in no acute distress.  HEENT: Normal but has alopecia.  Neck: Supple, no JVD, carotid bruits, or masses noted. I do not see a rash Cardiac: Regular rate and rhythm. She has an S3 noted. No real murmur that I could appreciate. No edema.  Respiratory:  Lungs are clear to auscultation bilaterally with normal work of breathing.  GI: Soft and nontender.  MS: No deformity or atrophy. Gait and ROM intact. Skin: Warm and dry. Color is normal.    Neuro:  Strength and sensation are intact and no gross focal deficits noted.  Psych: Alert, appropriate and with normal affect.   LABORATORY DATA:  EKG:  EKG is not ordered today.  Lab Results  Component Value Date   WBC 9.4 02/18/2015   HGB 8.9* 02/18/2015   HCT 27.8* 02/18/2015   PLT 393 02/18/2015   GLUCOSE 281* 02/18/2015   CHOL 125 02/16/2015   TRIG 180* 02/16/2015  HDL 27* 02/16/2015   LDLCALC 62 02/16/2015   ALT 22 02/14/2015   AST 30 02/14/2015   NA 136 02/18/2015   K 4.4 02/18/2015   CL 101 02/18/2015   CREATININE 1.69* 02/18/2015   BUN 53* 02/18/2015   CO2 25 02/18/2015   TSH 2.035 02/14/2015   INR 1.11 02/14/2015   HGBA1C 7.5* 02/14/2015    BNP (last 3 results)  Recent Labs  02/14/15 0247  BNP 1346.9*    ProBNP (last 3 results) No results for input(s): PROBNP in the last 8760 hours.   Other Studies Reviewed Today:  Echo Study Conclusions from 01/2015  - Left ventricle: The cavity size was normal. Wall thickness was normal. Systolic function was moderately to severely reduced. The estimated ejection fraction was in the range of 30% to 35%. Diffuse hypokinesis. Doppler parameters are consistent with abnormal left ventricular relaxation (grade 1 diastolic dysfunction). The E/e&' ratio is >15, suggesting elevated LV filling pressure. - Aortic valve: Trileaflet. Sclerosis without stenosis. There was mild regurgitation. - Mitral valve: Mildly thickened leaflets . There was moderate to severe functional regurgitation. - Left atrium: Moderately dilated at 39 ml/m2. - Right ventricle: The cavity size was mildly dilated. - Atrial septum: Mobile IAS - there is a suggestion of small PFO by color doppler. - Inferior vena cava: The vessel was normal in size. The respirophasic diameter changes were in the normal range (>= 50%), consistent with normal central venous pressure. - Pericardium, extracardiac: There was no pericardial  effusion. There was a left pleural effusion.  Impressions:  - LVEF 30-35%, severe global hypokinesis, diastolic dysfunction with elevated LV filling pressure, mild AI, moderate to severe functional MR, moderate LAE, possible small PFO by color doppler, left pleural effusion.  Nuclear stress test 1. No reversible ischemia, though there is a fixed defect in the anterior septal wall in the mid ventricle. 2. Global hypokinesis. 3. Left ventricular ejection fraction 22% 4. High-risk stress test findings*, defined by the patient's severely abnormal end-diastolic volume and ejection fraction.   Assessment/Plan: 1.Chronic systolic HF - EF of 08%. Not sure what to make of yesterday's symptoms. BP is up. She remains tachycardic - will add low dose Norvasc, increase Coreg. Reminded to use extra lasix prn. Restrict salt.   2.Prior elevated troponin most consistent with demand ischemia in the setting of hypertensive urgency and CHF as well as renal insuff with decreased clearance.  Continue ASA/BB.   3. CKD - for renal duplex - will go ahead and put in referral to nephrology  4. Hypertensive urgency -  No ACE I or ARB due to renal insuff (she was on ARB at home) - adding Norvasc today. Coreg increased.   5. LBBB - ? New or old - no EKG to compare  6. Anemia - rechecking today. Looks like she was iron deficient - sending to nephrology may need Aranesp/GI referral, etc.   Current medicines are reviewed with the patient today.  The patient does not have concerns regarding medicines other than what has been noted above.  The following changes have been made:  See above.  Labs/ tests ordered today include:    Orders Placed This Encounter  Procedures  . Basic metabolic panel  . CBC  . Brain natriuretic peptide     Disposition:   FU with Dr. Radford Pax in 3 weeks or me on a day that she is here.   Patient is agreeable to this plan and will call if any problems develop in the  interim.   Signed: Burtis Junes, RN, ANP-C 03/01/2015 8:11 AM  [place] [address] [place], [place]  [address] Phone: [phone] Fax: [phone]

## 2015-03-01 NOTE — Patient Instructions (Signed)
We will be checking the following labs today BMET & CBC  Stay on your current medicines but    I am adding Norvasc 2.5 mg a day   I am increasing the Coreg to 1 1/2 pills twice a day (18.75 mg)  We will refer you to nephrology regarding your kidney function  See Dr. Radford Pax in 3 weeks  Renal duplex as planned  Restrict your salt   Weigh daily  Take an extra dose of your Lasix if you have a weight gain of 2 to 3 pounds overnight  Call the [place] office at [phone] if you have any questions, problems or concerns.

## 2015-03-01 NOTE — Telephone Encounter (Signed)
New message ° ° ° ° ° °Returning Pam's call °

## 2015-03-04 ENCOUNTER — Encounter (HOSPITAL_COMMUNITY): Payer: Medicare Other

## 2015-03-05 ENCOUNTER — Ambulatory Visit (HOSPITAL_COMMUNITY): Payer: Medicare Other | Attending: Cardiology | Admitting: Cardiology

## 2015-03-05 ENCOUNTER — Encounter: Payer: Self-pay | Admitting: Nurse Practitioner

## 2015-03-05 ENCOUNTER — Encounter (HOSPITAL_COMMUNITY): Payer: Medicare Other

## 2015-03-05 DIAGNOSIS — N189 Chronic kidney disease, unspecified: Secondary | ICD-10-CM

## 2015-03-05 DIAGNOSIS — I509 Heart failure, unspecified: Secondary | ICD-10-CM | POA: Diagnosis not present

## 2015-03-05 DIAGNOSIS — E785 Hyperlipidemia, unspecified: Secondary | ICD-10-CM | POA: Diagnosis not present

## 2015-03-05 DIAGNOSIS — I10 Essential (primary) hypertension: Secondary | ICD-10-CM | POA: Diagnosis not present

## 2015-03-05 DIAGNOSIS — E119 Type 2 diabetes mellitus without complications: Secondary | ICD-10-CM | POA: Diagnosis not present

## 2015-03-05 NOTE — Progress Notes (Signed)
Renal artery duplex performed  

## 2015-03-07 ENCOUNTER — Other Ambulatory Visit: Payer: Self-pay | Admitting: Physician Assistant

## 2015-03-07 MED ORDER — FUROSEMIDE 20 MG PO TABS: 20.0000 mg | ORAL_TABLET | Freq: Every day | ORAL | Status: DC

## 2015-03-08 DIAGNOSIS — I10 Essential (primary) hypertension: Secondary | ICD-10-CM | POA: Diagnosis not present

## 2015-03-08 DIAGNOSIS — I509 Heart failure, unspecified: Secondary | ICD-10-CM | POA: Diagnosis not present

## 2015-03-08 DIAGNOSIS — E785 Hyperlipidemia, unspecified: Secondary | ICD-10-CM | POA: Diagnosis not present

## 2015-03-08 DIAGNOSIS — E119 Type 2 diabetes mellitus without complications: Secondary | ICD-10-CM | POA: Diagnosis not present

## 2015-03-11 DIAGNOSIS — I509 Heart failure, unspecified: Secondary | ICD-10-CM | POA: Diagnosis not present

## 2015-03-11 DIAGNOSIS — E119 Type 2 diabetes mellitus without complications: Secondary | ICD-10-CM | POA: Diagnosis not present

## 2015-03-11 DIAGNOSIS — E785 Hyperlipidemia, unspecified: Secondary | ICD-10-CM | POA: Diagnosis not present

## 2015-03-11 DIAGNOSIS — I10 Essential (primary) hypertension: Secondary | ICD-10-CM | POA: Diagnosis not present

## 2015-03-14 ENCOUNTER — Telehealth: Payer: Self-pay | Admitting: Nurse Practitioner

## 2015-03-14 NOTE — Telephone Encounter (Signed)
Katie simons PA is aware of pt's symptoms. Pt denies any swelling in LE. PA recommends for pt to take a allergy medication without a "D" because it could be her allergies. Pt is aware, she states she takes medication for allergies some times. Pt is aware to call the office if needed. Pt verbalized understanding.

## 2015-03-14 NOTE — Telephone Encounter (Signed)
Pt said that she started to have a dry cough since yesterday evening, some slight chest tightness, and slight SOB, BECAUSE OF HER CHEST TIGHTNESS. Pt's PO2 saturation is 93 % at room air. BP is 153/72 this AM. Pt takes Lasix 20 mg po daily. Because pt has been coughing during the night ;her chest is sore. She does not know if the cough is allergies or her CHF. Pt has an appointment with Truitt Merle Np on 03/02/15 at 10:00 AM.

## 2015-03-14 NOTE — Telephone Encounter (Signed)
New message      Pt has CHF.  Last night she developed a hacky cough.  Could it be allergies or her CHF?  Please call

## 2015-03-15 DIAGNOSIS — I509 Heart failure, unspecified: Secondary | ICD-10-CM | POA: Diagnosis not present

## 2015-03-15 DIAGNOSIS — E119 Type 2 diabetes mellitus without complications: Secondary | ICD-10-CM | POA: Diagnosis not present

## 2015-03-15 DIAGNOSIS — E785 Hyperlipidemia, unspecified: Secondary | ICD-10-CM | POA: Diagnosis not present

## 2015-03-15 DIAGNOSIS — I10 Essential (primary) hypertension: Secondary | ICD-10-CM | POA: Diagnosis not present

## 2015-03-16 ENCOUNTER — Inpatient Hospital Stay (HOSPITAL_COMMUNITY)
Admission: EM | Admit: 2015-03-16 | Discharge: 2015-04-02 | DRG: 252 | Disposition: A | Discharge status: Home Health | Payer: Medicare Other | Attending: Cardiology | Admitting: Cardiology

## 2015-03-16 ENCOUNTER — Encounter (HOSPITAL_COMMUNITY): Payer: Self-pay | Admitting: Emergency Medicine

## 2015-03-16 ENCOUNTER — Emergency Department (HOSPITAL_COMMUNITY): Payer: Medicare Other

## 2015-03-16 DIAGNOSIS — R0602 Shortness of breath: Secondary | ICD-10-CM | POA: Diagnosis not present

## 2015-03-16 DIAGNOSIS — N184 Chronic kidney disease, stage 4 (severe): Secondary | ICD-10-CM | POA: Diagnosis present

## 2015-03-16 DIAGNOSIS — N186 End stage renal disease: Secondary | ICD-10-CM | POA: Diagnosis not present

## 2015-03-16 DIAGNOSIS — Z992 Dependence on renal dialysis: Secondary | ICD-10-CM | POA: Diagnosis not present

## 2015-03-16 DIAGNOSIS — I340 Nonrheumatic mitral (valve) insufficiency: Secondary | ICD-10-CM | POA: Diagnosis not present

## 2015-03-16 DIAGNOSIS — I129 Hypertensive chronic kidney disease with stage 1 through stage 4 chronic kidney disease, or unspecified chronic kidney disease: Secondary | ICD-10-CM | POA: Diagnosis not present

## 2015-03-16 DIAGNOSIS — Z79899 Other long term (current) drug therapy: Secondary | ICD-10-CM

## 2015-03-16 DIAGNOSIS — R0689 Other abnormalities of breathing: Secondary | ICD-10-CM

## 2015-03-16 DIAGNOSIS — N179 Acute kidney failure, unspecified: Secondary | ICD-10-CM | POA: Diagnosis not present

## 2015-03-16 DIAGNOSIS — Z85828 Personal history of other malignant neoplasm of skin: Secondary | ICD-10-CM | POA: Diagnosis not present

## 2015-03-16 DIAGNOSIS — R339 Retention of urine, unspecified: Secondary | ICD-10-CM | POA: Diagnosis not present

## 2015-03-16 DIAGNOSIS — I2510 Atherosclerotic heart disease of native coronary artery without angina pectoris: Secondary | ICD-10-CM | POA: Diagnosis present

## 2015-03-16 DIAGNOSIS — I447 Left bundle-branch block, unspecified: Secondary | ICD-10-CM | POA: Diagnosis not present

## 2015-03-16 DIAGNOSIS — N189 Chronic kidney disease, unspecified: Secondary | ICD-10-CM | POA: Diagnosis not present

## 2015-03-16 DIAGNOSIS — R778 Other specified abnormalities of plasma proteins: Secondary | ICD-10-CM | POA: Diagnosis present

## 2015-03-16 DIAGNOSIS — E871 Hypo-osmolality and hyponatremia: Secondary | ICD-10-CM | POA: Diagnosis not present

## 2015-03-16 DIAGNOSIS — I214 Non-ST elevation (NSTEMI) myocardial infarction: Secondary | ICD-10-CM | POA: Diagnosis not present

## 2015-03-16 DIAGNOSIS — Z7982 Long term (current) use of aspirin: Secondary | ICD-10-CM

## 2015-03-16 DIAGNOSIS — I10 Essential (primary) hypertension: Secondary | ICD-10-CM | POA: Diagnosis not present

## 2015-03-16 DIAGNOSIS — D509 Iron deficiency anemia, unspecified: Secondary | ICD-10-CM | POA: Diagnosis present

## 2015-03-16 DIAGNOSIS — I701 Atherosclerosis of renal artery: Secondary | ICD-10-CM | POA: Insufficient documentation

## 2015-03-16 DIAGNOSIS — I5041 Acute combined systolic (congestive) and diastolic (congestive) heart failure: Principal | ICD-10-CM | POA: Diagnosis present

## 2015-03-16 DIAGNOSIS — Z6829 Body mass index (BMI) 29.0-29.9, adult: Secondary | ICD-10-CM

## 2015-03-16 DIAGNOSIS — I509 Heart failure, unspecified: Secondary | ICD-10-CM | POA: Diagnosis not present

## 2015-03-16 DIAGNOSIS — I5022 Chronic systolic (congestive) heart failure: Secondary | ICD-10-CM | POA: Diagnosis not present

## 2015-03-16 DIAGNOSIS — I429 Cardiomyopathy, unspecified: Secondary | ICD-10-CM

## 2015-03-16 DIAGNOSIS — N390 Urinary tract infection, site not specified: Secondary | ICD-10-CM | POA: Diagnosis not present

## 2015-03-16 DIAGNOSIS — N183 Chronic kidney disease, stage 3 unspecified: Secondary | ICD-10-CM | POA: Diagnosis present

## 2015-03-16 DIAGNOSIS — K5900 Constipation, unspecified: Secondary | ICD-10-CM | POA: Diagnosis not present

## 2015-03-16 DIAGNOSIS — R0600 Dyspnea, unspecified: Secondary | ICD-10-CM

## 2015-03-16 DIAGNOSIS — E669 Obesity, unspecified: Secondary | ICD-10-CM | POA: Diagnosis present

## 2015-03-16 DIAGNOSIS — J9601 Acute respiratory failure with hypoxia: Secondary | ICD-10-CM | POA: Diagnosis not present

## 2015-03-16 DIAGNOSIS — I2584 Coronary atherosclerosis due to calcified coronary lesion: Secondary | ICD-10-CM | POA: Diagnosis present

## 2015-03-16 DIAGNOSIS — H409 Unspecified glaucoma: Secondary | ICD-10-CM | POA: Diagnosis not present

## 2015-03-16 DIAGNOSIS — N038 Chronic nephritic syndrome with other morphologic changes: Secondary | ICD-10-CM | POA: Diagnosis not present

## 2015-03-16 DIAGNOSIS — B9620 Unspecified Escherichia coli [E. coli] as the cause of diseases classified elsewhere: Secondary | ICD-10-CM | POA: Diagnosis not present

## 2015-03-16 DIAGNOSIS — E1129 Type 2 diabetes mellitus with other diabetic kidney complication: Secondary | ICD-10-CM | POA: Diagnosis present

## 2015-03-16 DIAGNOSIS — E1122 Type 2 diabetes mellitus with diabetic chronic kidney disease: Secondary | ICD-10-CM | POA: Diagnosis not present

## 2015-03-16 DIAGNOSIS — R0789 Other chest pain: Secondary | ICD-10-CM | POA: Diagnosis not present

## 2015-03-16 DIAGNOSIS — R7989 Other specified abnormal findings of blood chemistry: Secondary | ICD-10-CM | POA: Diagnosis present

## 2015-03-16 DIAGNOSIS — I161 Hypertensive emergency: Secondary | ICD-10-CM | POA: Insufficient documentation

## 2015-03-16 DIAGNOSIS — E876 Hypokalemia: Secondary | ICD-10-CM | POA: Diagnosis not present

## 2015-03-16 DIAGNOSIS — Z7902 Long term (current) use of antithrombotics/antiplatelets: Secondary | ICD-10-CM | POA: Diagnosis not present

## 2015-03-16 DIAGNOSIS — Z87891 Personal history of nicotine dependence: Secondary | ICD-10-CM

## 2015-03-16 DIAGNOSIS — E785 Hyperlipidemia, unspecified: Secondary | ICD-10-CM | POA: Diagnosis not present

## 2015-03-16 DIAGNOSIS — E1165 Type 2 diabetes mellitus with hyperglycemia: Secondary | ICD-10-CM | POA: Diagnosis not present

## 2015-03-16 HISTORY — DX: Left bundle-branch block, unspecified: I44.7

## 2015-03-16 HISTORY — DX: Type 2 diabetes mellitus with other diabetic kidney complication: E11.29

## 2015-03-16 HISTORY — DX: Chronic kidney disease, stage 3 unspecified: N18.30

## 2015-03-16 HISTORY — DX: Cardiomyopathy, unspecified: I42.9

## 2015-03-16 HISTORY — DX: Heart failure, unspecified: I50.9

## 2015-03-16 HISTORY — DX: Chronic kidney disease, stage 3 (moderate): N18.3

## 2015-03-16 LAB — I-STAT CHEM 8, ED
BUN: 40 mg/dL — ABNORMAL HIGH (ref 6–23)
CHLORIDE: 93 mmol/L — AB (ref 96–112)
CREATININE: 1.5 mg/dL — AB (ref 0.50–1.10)
Calcium, Ion: 1.03 mmol/L — ABNORMAL LOW (ref 1.13–1.30)
Glucose, Bld: 309 mg/dL — ABNORMAL HIGH (ref 70–99)
HEMATOCRIT: 34 % — AB (ref 36.0–46.0)
HEMOGLOBIN: 11.6 g/dL — AB (ref 12.0–15.0)
POTASSIUM: 3 mmol/L — AB (ref 3.5–5.1)
Sodium: 132 mmol/L — ABNORMAL LOW (ref 135–145)
TCO2: 23 mmol/L (ref 0–100)

## 2015-03-16 LAB — CBC WITH DIFFERENTIAL/PLATELET
Basophils Absolute: 0.1 10*3/uL (ref 0.0–0.1)
Basophils Relative: 1 % (ref 0–1)
Eosinophils Absolute: 0.3 10*3/uL (ref 0.0–0.7)
Eosinophils Relative: 3 % (ref 0–5)
HCT: 31.9 % — ABNORMAL LOW (ref 36.0–46.0)
Hemoglobin: 10.4 g/dL — ABNORMAL LOW (ref 12.0–15.0)
Lymphocytes Relative: 9 % — ABNORMAL LOW (ref 12–46)
Lymphs Abs: 0.9 10*3/uL (ref 0.7–4.0)
MCH: 28.2 pg (ref 26.0–34.0)
MCHC: 32.6 g/dL (ref 30.0–36.0)
MCV: 86.4 fL (ref 78.0–100.0)
MONO ABS: 0.5 10*3/uL (ref 0.1–1.0)
Monocytes Relative: 5 % (ref 3–12)
NEUTROS PCT: 82 % — AB (ref 43–77)
Neutro Abs: 8.9 10*3/uL — ABNORMAL HIGH (ref 1.7–7.7)
Platelets: 318 10*3/uL (ref 150–400)
RBC: 3.69 MIL/uL — ABNORMAL LOW (ref 3.87–5.11)
RDW: 14.9 % (ref 11.5–15.5)
WBC: 10.7 10*3/uL — AB (ref 4.0–10.5)

## 2015-03-16 LAB — BASIC METABOLIC PANEL
Anion gap: 14 (ref 5–15)
BUN: 43 mg/dL — ABNORMAL HIGH (ref 6–23)
CHLORIDE: 92 mmol/L — AB (ref 96–112)
CO2: 24 mmol/L (ref 19–32)
Calcium: 8.5 mg/dL (ref 8.4–10.5)
Creatinine, Ser: 1.6 mg/dL — ABNORMAL HIGH (ref 0.50–1.10)
GFR calc Af Amer: 34 mL/min — ABNORMAL LOW (ref >90)
GFR calc non Af Amer: 30 mL/min — ABNORMAL LOW (ref >90)
GLUCOSE: 299 mg/dL — AB (ref 70–99)
POTASSIUM: 3.9 mmol/L (ref 3.5–5.1)
SODIUM: 130 mmol/L — AB (ref 135–145)

## 2015-03-16 LAB — I-STAT TROPONIN, ED: TROPONIN I, POC: 0.03 ng/mL (ref 0.00–0.08)

## 2015-03-16 LAB — MAGNESIUM: Magnesium: 2.1 mg/dL (ref 1.5–2.5)

## 2015-03-16 LAB — PROTIME-INR
INR: 1.1 (ref 0.00–1.49)
PROTHROMBIN TIME: 14.4 s (ref 11.6–15.2)

## 2015-03-16 LAB — GLUCOSE, CAPILLARY
GLUCOSE-CAPILLARY: 190 mg/dL — AB (ref 70–99)
Glucose-Capillary: 257 mg/dL — ABNORMAL HIGH (ref 70–99)
Glucose-Capillary: 262 mg/dL — ABNORMAL HIGH (ref 70–99)

## 2015-03-16 LAB — BRAIN NATRIURETIC PEPTIDE: B Natriuretic Peptide: 648.2 pg/mL — ABNORMAL HIGH (ref 0.0–100.0)

## 2015-03-16 MED ORDER — HEPARIN SODIUM (PORCINE) 5000 UNIT/ML IJ SOLN
5000.0000 [IU] | Freq: Three times a day (TID) | INTRAMUSCULAR | Status: DC
  Administered 2015-03-16 – 2015-03-17 (×3): 5000 [IU] via SUBCUTANEOUS
  Filled 2015-03-16 (×5): qty 1

## 2015-03-16 MED ORDER — POTASSIUM CHLORIDE 10 MEQ/100ML IV SOLN
10.0000 meq | Freq: Once | INTRAVENOUS | Status: AC
  Administered 2015-03-16: 10 meq via INTRAVENOUS
  Filled 2015-03-16: qty 100

## 2015-03-16 MED ORDER — ESTROGENS, CONJUGATED 0.625 MG/GM VA CREA
1.0000 | TOPICAL_CREAM | VAGINAL | Status: DC
  Filled 2015-03-16: qty 30

## 2015-03-16 MED ORDER — LATANOPROST 0.005 % OP SOLN
1.0000 [drp] | Freq: Every day | OPHTHALMIC | Status: DC
  Administered 2015-03-16 – 2015-04-01 (×16): 1 [drp] via OPHTHALMIC
  Filled 2015-03-16 (×4): qty 2.5

## 2015-03-16 MED ORDER — SODIUM CHLORIDE 0.9 % IJ SOLN: 3.0000 mL | INTRAMUSCULAR | Status: DC | PRN

## 2015-03-16 MED ORDER — ONDANSETRON HCL 4 MG/2ML IJ SOLN
4.0000 mg | Freq: Four times a day (QID) | INTRAMUSCULAR | Status: DC | PRN
  Administered 2015-03-20 – 2015-03-28 (×8): 4 mg via INTRAVENOUS
  Filled 2015-03-16 (×9): qty 2

## 2015-03-16 MED ORDER — NITROGLYCERIN 0.4 MG SL SUBL: 0.4000 mg | SUBLINGUAL_TABLET | SUBLINGUAL | Status: DC | PRN

## 2015-03-16 MED ORDER — ACETAMINOPHEN 325 MG PO TABS
650.0000 mg | ORAL_TABLET | ORAL | Status: DC | PRN
  Administered 2015-03-16 – 2015-04-01 (×4): 650 mg via ORAL
  Filled 2015-03-16 (×4): qty 2

## 2015-03-16 MED ORDER — FA-PYRIDOXINE-CYANOCOBALAMIN 2.5-25-2 MG PO TABS
1.0000 | ORAL_TABLET | Freq: Two times a day (BID) | ORAL | Status: DC
  Administered 2015-03-16 – 2015-04-01 (×19): 1 via ORAL
  Filled 2015-03-16 (×36): qty 1

## 2015-03-16 MED ORDER — DOCUSATE SODIUM 100 MG PO CAPS
100.0000 mg | ORAL_CAPSULE | Freq: Two times a day (BID) | ORAL | Status: DC
  Administered 2015-03-16 – 2015-04-02 (×30): 100 mg via ORAL
  Filled 2015-03-16 (×36): qty 1

## 2015-03-16 MED ORDER — SODIUM CHLORIDE 0.9 % IJ SOLN
3.0000 mL | Freq: Two times a day (BID) | INTRAMUSCULAR | Status: DC
  Administered 2015-03-16 – 2015-04-01 (×12): 3 mL via INTRAVENOUS

## 2015-03-16 MED ORDER — FUROSEMIDE 10 MG/ML IJ SOLN
40.0000 mg | Freq: Once | INTRAMUSCULAR | Status: AC
  Administered 2015-03-16: 40 mg via INTRAVENOUS
  Filled 2015-03-16: qty 4

## 2015-03-16 MED ORDER — GLYBURIDE 5 MG PO TABS
5.0000 mg | ORAL_TABLET | Freq: Two times a day (BID) | ORAL | Status: DC
  Administered 2015-03-16 – 2015-03-19 (×5): 5 mg via ORAL
  Filled 2015-03-16 (×8): qty 1

## 2015-03-16 MED ORDER — ASPIRIN 300 MG RE SUPP: 300.0000 mg | RECTAL | Status: AC

## 2015-03-16 MED ORDER — POTASSIUM CHLORIDE CRYS ER 20 MEQ PO TBCR
20.0000 meq | EXTENDED_RELEASE_TABLET | Freq: Once | ORAL | Status: AC
Start: 2015-03-17 — End: 2015-03-17
  Administered 2015-03-17: 20 meq via ORAL
  Filled 2015-03-16: qty 1

## 2015-03-16 MED ORDER — CALCIUM CITRATE 950 (200 CA) MG PO TABS
1.0000 | ORAL_TABLET | Freq: Every day | ORAL | Status: DC
  Administered 2015-03-18 – 2015-04-01 (×7): 200 mg via ORAL
  Filled 2015-03-16 (×18): qty 1

## 2015-03-16 MED ORDER — NITROGLYCERIN IN D5W 200-5 MCG/ML-% IV SOLN
0.0000 ug/min | INTRAVENOUS | Status: DC
  Administered 2015-03-16: 25 ug/min via INTRAVENOUS
  Administered 2015-03-16: 50 ug/min via INTRAVENOUS
  Administered 2015-03-17: 60 ug/min via INTRAVENOUS
  Administered 2015-03-18: 85 ug/min via INTRAVENOUS
  Administered 2015-03-19: 90 ug/min via INTRAVENOUS
  Administered 2015-03-20: 100 ug/min via INTRAVENOUS
  Administered 2015-03-20: 70 ug/min via INTRAVENOUS
  Administered 2015-03-21 – 2015-03-22 (×2): 100 ug/min via INTRAVENOUS
  Filled 2015-03-16 (×15): qty 250

## 2015-03-16 MED ORDER — CARVEDILOL 3.125 MG PO TABS
3.1250 mg | ORAL_TABLET | Freq: Two times a day (BID) | ORAL | Status: DC
  Administered 2015-03-16 – 2015-04-02 (×32): 3.125 mg via ORAL
  Filled 2015-03-16 (×36): qty 1

## 2015-03-16 MED ORDER — VITAMIN D3 25 MCG (1000 UNIT) PO TABS
1000.0000 [IU] | ORAL_TABLET | Freq: Every day | ORAL | Status: DC
  Administered 2015-03-16 – 2015-04-02 (×11): 1000 [IU] via ORAL
  Filled 2015-03-16 (×18): qty 1

## 2015-03-16 MED ORDER — VITAMIN C 500 MG PO TABS
1000.0000 mg | ORAL_TABLET | Freq: Every day | ORAL | Status: DC
  Administered 2015-03-18 – 2015-03-29 (×6): 1000 mg via ORAL
  Filled 2015-03-16 (×17): qty 2

## 2015-03-16 MED ORDER — BETA CAROTENE 10000 UNITS PO CAPS: 10000.0000 [IU] | ORAL_CAPSULE | Freq: Every day | ORAL | Status: DC

## 2015-03-16 MED ORDER — ASPIRIN 81 MG PO CHEW: 324.0000 mg | CHEWABLE_TABLET | ORAL | Status: AC

## 2015-03-16 MED ORDER — SODIUM CHLORIDE 0.9 % IV SOLN
250.0000 mL | INTRAVENOUS | Status: DC | PRN
  Administered 2015-03-22: 250 mL via INTRAVENOUS

## 2015-03-16 MED ORDER — FUROSEMIDE 10 MG/ML IJ SOLN
40.0000 mg | Freq: Two times a day (BID) | INTRAMUSCULAR | Status: AC
  Administered 2015-03-16 – 2015-03-17 (×2): 40 mg via INTRAVENOUS
  Filled 2015-03-16 (×2): qty 4

## 2015-03-16 MED ORDER — ATORVASTATIN CALCIUM 10 MG PO TABS
10.0000 mg | ORAL_TABLET | Freq: Every day | ORAL | Status: DC
  Administered 2015-03-16 – 2015-04-01 (×16): 10 mg via ORAL
  Filled 2015-03-16 (×18): qty 1

## 2015-03-16 MED ORDER — ASPIRIN EC 81 MG PO TBEC
81.0000 mg | DELAYED_RELEASE_TABLET | Freq: Every day | ORAL | Status: DC
  Administered 2015-03-17: 81 mg via ORAL
  Filled 2015-03-16: qty 1

## 2015-03-16 MED ORDER — ZOLPIDEM TARTRATE 5 MG PO TABS
5.0000 mg | ORAL_TABLET | Freq: Every evening | ORAL | Status: DC | PRN
  Administered 2015-03-20 – 2015-04-01 (×13): 5 mg via ORAL
  Filled 2015-03-16 (×13): qty 1

## 2015-03-16 MED ORDER — LINAGLIPTIN 5 MG PO TABS
5.0000 mg | ORAL_TABLET | Freq: Every day | ORAL | Status: DC
  Administered 2015-03-16 – 2015-04-02 (×18): 5 mg via ORAL
  Filled 2015-03-16 (×18): qty 1

## 2015-03-16 MED ORDER — POLYSACCHARIDE IRON COMPLEX 150 MG PO CAPS
150.0000 mg | ORAL_CAPSULE | Freq: Two times a day (BID) | ORAL | Status: DC
  Administered 2015-03-16 – 2015-04-02 (×20): 150 mg via ORAL
  Filled 2015-03-16 (×35): qty 1

## 2015-03-16 MED ORDER — INSULIN ASPART 100 UNIT/ML ~~LOC~~ SOLN
0.0000 [IU] | Freq: Every day | SUBCUTANEOUS | Status: DC
  Administered 2015-03-17 – 2015-03-23 (×5): 2 [IU] via SUBCUTANEOUS
  Administered 2015-03-29: 4 [IU] via SUBCUTANEOUS
  Administered 2015-04-01: 2 [IU] via SUBCUTANEOUS

## 2015-03-16 MED ORDER — FUROSEMIDE 10 MG/ML IJ SOLN
20.0000 mg | Freq: Once | INTRAMUSCULAR | Status: AC
  Administered 2015-03-16: 20 mg via INTRAVENOUS
  Filled 2015-03-16: qty 2

## 2015-03-16 MED ORDER — VITAMIN E 180 MG (400 UNIT) PO CAPS
400.0000 [IU] | ORAL_CAPSULE | Freq: Every day | ORAL | Status: DC
  Administered 2015-03-18 – 2015-03-29 (×6): 400 [IU] via ORAL
  Filled 2015-03-16 (×17): qty 1

## 2015-03-16 MED ORDER — HYDRALAZINE HCL 25 MG PO TABS
12.5000 mg | ORAL_TABLET | Freq: Three times a day (TID) | ORAL | Status: DC
  Administered 2015-03-16 – 2015-03-18 (×6): 12.5 mg via ORAL
  Filled 2015-03-16 (×9): qty 0.5

## 2015-03-16 MED ORDER — ALPRAZOLAM 0.25 MG PO TABS
0.2500 mg | ORAL_TABLET | Freq: Two times a day (BID) | ORAL | Status: DC | PRN
  Administered 2015-03-20 – 2015-04-01 (×14): 0.25 mg via ORAL
  Filled 2015-03-16 (×14): qty 1

## 2015-03-16 MED ORDER — INSULIN ASPART 100 UNIT/ML ~~LOC~~ SOLN
0.0000 [IU] | Freq: Three times a day (TID) | SUBCUTANEOUS | Status: DC
  Administered 2015-03-16 (×2): 5 [IU] via SUBCUTANEOUS
  Administered 2015-03-17 – 2015-03-18 (×6): 3 [IU] via SUBCUTANEOUS
  Administered 2015-03-19: 1 [IU] via SUBCUTANEOUS
  Administered 2015-03-19: 3 [IU] via SUBCUTANEOUS
  Administered 2015-03-19 – 2015-03-20 (×2): 2 [IU] via SUBCUTANEOUS
  Administered 2015-03-20 (×2): 3 [IU] via SUBCUTANEOUS
  Administered 2015-03-21: 7 [IU] via SUBCUTANEOUS
  Administered 2015-03-21: 5 [IU] via SUBCUTANEOUS
  Administered 2015-03-21: 3 [IU] via SUBCUTANEOUS
  Administered 2015-03-22: 2 [IU] via SUBCUTANEOUS
  Administered 2015-03-22: 3 [IU] via SUBCUTANEOUS
  Administered 2015-03-22 – 2015-03-23 (×3): 5 [IU] via SUBCUTANEOUS
  Administered 2015-03-24: 3 [IU] via SUBCUTANEOUS
  Administered 2015-03-24: 2 [IU] via SUBCUTANEOUS
  Administered 2015-03-24: 5 [IU] via SUBCUTANEOUS
  Administered 2015-03-25: 3 [IU] via SUBCUTANEOUS
  Administered 2015-03-25: 2 [IU] via SUBCUTANEOUS
  Administered 2015-03-25 – 2015-03-27 (×5): 3 [IU] via SUBCUTANEOUS
  Administered 2015-03-27: 2 [IU] via SUBCUTANEOUS
  Administered 2015-03-27: 3 [IU] via SUBCUTANEOUS
  Administered 2015-03-28: 2 [IU] via SUBCUTANEOUS
  Administered 2015-03-28 – 2015-03-29 (×2): 3 [IU] via SUBCUTANEOUS
  Administered 2015-03-30: 9 [IU] via SUBCUTANEOUS
  Administered 2015-03-30 – 2015-03-31 (×2): 2 [IU] via SUBCUTANEOUS
  Administered 2015-03-31: 5 [IU] via SUBCUTANEOUS
  Administered 2015-04-01 (×2): 1 [IU] via SUBCUTANEOUS
  Administered 2015-04-01 – 2015-04-02 (×2): 3 [IU] via SUBCUTANEOUS

## 2015-03-16 MED ORDER — POTASSIUM CHLORIDE CRYS ER 20 MEQ PO TBCR
40.0000 meq | EXTENDED_RELEASE_TABLET | Freq: Once | ORAL | Status: AC
  Administered 2015-03-16: 40 meq via ORAL
  Filled 2015-03-16: qty 2

## 2015-03-16 NOTE — ED Notes (Addendum)
Lab called concerning results not showing. Found blood sitting in mini lab

## 2015-03-16 NOTE — ED Notes (Signed)
Dr. Reather Converse back at the bedside with pt.

## 2015-03-16 NOTE — H&P (Signed)
Patient ID: ALIZEE MAPLE MRN: [id number], DOB/AGE: 1934/05/03   Admit date: 03/16/2015   Primary Physician: Tommy Medal, MD Primary Cardiologist: Dr Radford Pax  HPI:   Kelleen Stolze Gaughan is a 81 y.o. female with a history of hypertension, hyperlipidemia, diabetes mellitus type 2 who presented to the ER 02/14/15 because of worsening shortness of breath requiring Bipap. She had an elevated Troponin of 1.18 and a BNP of 742.  Echo showed global HK with an EF of 30-35%. Her SCr was 1.87. Myoview 02/16/15 showed no ischemia but she did have an AS WMA. She was discharged 02/18/15 after a 2 lb diuresis (discharge wgt 161). OP renal dopplers did not suggest renal artery stenosis. She saw Anola Gurney in follow up 03/01/15 (wgt 161) and was doing OK. This past Wednesday she though she had "an allergy" with cough. She tried a decongestant which she thought helped initially but late yesterday become increasingly SOB. She presented to the ED 6:30 this am SOB requiring bipap. She says her wgt was up to 163. She received 20 mg of Lasix with no significant diuresis as yet but she seems improved symptomatically and I was able to wean her off Bipap.   Problem List: Past Medical History  Diagnosis Date  . Atrophic vaginitis   . Yeast infection   . Glaucoma   . Cataracts, bilateral   . Type 2 diabetes, controlled, with renal manifestation 01/2000  . Hyperlipidemia   . Hypertension   . Congestive heart failure   . Cardiomyopathy   . LBBB (left bundle branch block)   . Chronic renal disease, stage III     Past Surgical History  Procedure Laterality Date  . Vaginal delivery      x3  . Colonoscopy  05/2003  . Finger surgery    . Cataract extraction w/ intraocular lens implant Right 2011    1 year later the left side done  . Colonoscopy  06/2008    Dr. Lajoyce Corners recheck in 5 years  . Total vaginal hysterectomy  1978    for endometriosis  . Appendectomy  1950  . Squamous cell carcinoma excision  6/15    Nose     Allergies:  Allergies  Allergen Reactions  . Alphagan [Brimonidine] Other (See Comments)    Redness in the eyes  . Azopt [Brinzolamide]     Redness in the eyes  . Cefdinir Diarrhea  . Macrobid [Nitrofurantoin Macrocrystal] Nausea And Vomiting    Fever, Chills  . Sulfa Antibiotics Nausea And Vomiting and Other (See Comments)    Fever,chills     Home Medications Current Facility-Administered Medications  Medication Dose Route Frequency Provider Last Rate Last Dose  . nitroGLYCERIN 50 mg in dextrose 5 % 250 mL (0.2 mg/mL) infusion  40-200 mcg/min Intravenous Titrated Elnora Morrison, MD 30 mL/hr at 03/16/15 0844 100 mcg/min at 03/16/15 0844  . potassium chloride 10 mEq in 100 mL IVPB  10 mEq Intravenous Once Elnora Morrison, MD 100 mL/hr at 03/16/15 1000 10 mEq at 03/16/15 1000  . potassium chloride 10 mEq in 100 mL IVPB  10 mEq Intravenous Once Elnora Morrison, MD       Current Outpatient Prescriptions  Medication Sig Dispense Refill  . ACCU-CHEK AVIVA PLUS test strip     . amLODipine (NORVASC) 2.5 MG tablet Take 1 tablet (2.5 mg total) by mouth daily. 30 tablet 3  . Ascorbic Acid (VITAMIN C) 1000 MG tablet Take 1,000 mg by mouth daily.    Marland Kitchen  aspirin 81 MG tablet Take 81 mg by mouth daily.    Marland Kitchen atorvastatin (LIPITOR) 10 MG tablet Take 10 mg by mouth daily.    . beta carotene 10000 UNIT capsule Take 10,000 Units by mouth daily.    . calcium citrate (CALCITRATE - DOSED IN MG ELEMENTAL CALCIUM) 950 MG tablet Take 1 tablet by mouth daily.    . carvedilol (COREG) 12.5 MG tablet Take 1.5 tablets (18.75 mg total) by mouth 2 (two) times daily with a meal. 90 tablet 3  . cholecalciferol (VITAMIN D) 1000 UNITS tablet Take 1,000 Units by mouth daily.    Marland Kitchen conjugated estrogens (PREMARIN) vaginal cream Place vaginally 2 (two) times a week. 42.5 g 3  . docusate sodium (COLACE) 100 MG capsule Take 1 capsule (100 mg total) by mouth 2 (two) times daily. 60 capsule 0  . furosemide (LASIX) 20 MG  tablet Take 1 tablet (20 mg total) by mouth daily. 30 tablet 11  . glucosamine-chondroitin 500-400 MG tablet Take 1 tablet by mouth 3 (three) times daily.    Marland Kitchen glyBURIDE (DIABETA) 5 MG tablet Take 5 mg by mouth 2 (two) times daily with a meal.     . iron polysaccharides (NIFEREX) 150 MG capsule Take 1 capsule (150 mg total) by mouth 2 (two) times daily. 60 capsule 2  . L-Methylfolate-B6-B12 (METANX PO) Take by mouth 2 (two) times daily.    . Lancets (ACCU-CHEK MULTICLIX) lancets     . Probiotic Product (PROBIOTIC DAILY PO) Take by mouth. Florastor    . sitaGLIPtin (JANUVIA) 50 MG tablet Take 1 tablet (50 mg total) by mouth daily. 30 tablet 2  . Travoprost, BAK Free, (TRAVATAN) 0.004 % SOLN ophthalmic solution Place 1 drop into both eyes at bedtime.    . vitamin E 400 UNIT capsule Take 400 Units by mouth daily.       Family History  Problem Relation Age of Onset  . Heart disease  67  . CAD Brother   . Hypertension Brother      History   Social History  . Marital Status: Married    Spouse Name: N/A  . Number of Children: N/A  . Years of Education: N/A   Occupational History  . Not on file.   Social History Main Topics  . Smoking status: Former Smoker -- 0.10 packs/day for 2 years    Types: Cigarettes  . Smokeless tobacco: Never Used  . Alcohol Use: No  . Drug Use: No  . Sexual Activity: Yes    Birth Control/ Protection: Surgical     Comment: [facility]   Other Topics Concern  . Not on file   Social History Narrative     Review of Systems: General: negative for chills, fever, night sweats or weight changes.  Cardiovascular: negative for chest pain, dyspnea on exertion, edema, orthopnea, palpitations, paroxysmal nocturnal dyspnea or shortness of breath Dermatological: negative for rash Respiratory: negative for cough or wheezing Urologic: negative for hematuria Abdominal: negative for nausea, vomiting, diarrhea, bright red blood per rectum, melena, or  hematemesis Neurologic: negative for visual changes, syncope, or dizziness All other systems reviewed and are otherwise negative except as noted above.  Physical Exam: Blood pressure 139/67, pulse 99, temperature 97.5 F (36.4 C), temperature source Axillary, resp. rate 28, height 5\' 2"  (1.575 m), weight 160 lb 3.2 oz (72.666 kg), last menstrual period 07/24/1977, SpO2 94 %.  General appearance: alert, cooperative, no distress and moderately obese Neck: no carotid bruit and no JVD Lungs:  rales 1/2 up bilateraly Heart: regular rate and rhythm and soft gallop, no significant MR appreciated Abdomen: obese non tender Extremities: trace edema Pulses: diminnished Skin: pale cool dry Neurologic: Grossly normal    Labs:   Results for orders placed or performed during the hospital encounter of 03/16/15 (from the past 24 hour(s))  CBC with Differential/Platelet     Status: Abnormal   Collection Time: 03/16/15  6:50 AM  Result Value Ref Range   WBC 10.7 (H) 4.0 - 10.5 K/uL   RBC 3.69 (L) 3.87 - 5.11 MIL/uL   Hemoglobin 10.4 (L) 12.0 - 15.0 g/dL   HCT 31.9 (L) 36.0 - 46.0 %   MCV 86.4 78.0 - 100.0 fL   MCH 28.2 26.0 - 34.0 pg   MCHC 32.6 30.0 - 36.0 g/dL   RDW 14.9 11.5 - 15.5 %   Platelets 318 150 - 400 K/uL   Neutrophils Relative % 82 (H) 43 - 77 %   Neutro Abs 8.9 (H) 1.7 - 7.7 K/uL   Lymphocytes Relative 9 (L) 12 - 46 %   Lymphs Abs 0.9 0.7 - 4.0 K/uL   Monocytes Relative 5 3 - 12 %   Monocytes Absolute 0.5 0.1 - 1.0 K/uL   Eosinophils Relative 3 0 - 5 %   Eosinophils Absolute 0.3 0.0 - 0.7 K/uL   Basophils Relative 1 0 - 1 %   Basophils Absolute 0.1 0.0 - 0.1 K/uL  Basic metabolic panel     Status: Abnormal   Collection Time: 03/16/15  6:50 AM  Result Value Ref Range   Sodium 130 (L) 135 - 145 mmol/L   Potassium 3.9 3.5 - 5.1 mmol/L   Chloride 92 (L) 96 - 112 mmol/L   CO2 24 19 - 32 mmol/L   Glucose, Bld 299 (H) 70 - 99 mg/dL   BUN 43 (H) 6 - 23 mg/dL   Creatinine, Ser  1.60 (H) 0.50 - 1.10 mg/dL   Calcium 8.5 8.4 - 10.5 mg/dL   GFR calc non Af Amer 30 (L) >90 mL/min   GFR calc Af Amer 34 (L) >90 mL/min   Anion gap 14 5 - 15  Protime-INR     Status: None   Collection Time: 03/16/15  6:50 AM  Result Value Ref Range   Prothrombin Time 14.4 11.6 - 15.2 seconds   INR 1.10 0.00 - 1.49  Brain natriuretic peptide     Status: Abnormal   Collection Time: 03/16/15  6:50 AM  Result Value Ref Range   B Natriuretic Peptide 648.2 (H) 0.0 - 100.0 pg/mL  I-stat troponin, ED     Status: None   Collection Time: 03/16/15  8:55 AM  Result Value Ref Range   Troponin i, poc 0.03 0.00 - 0.08 ng/mL   Comment 3          I-stat chem 8, ed     Status: Abnormal   Collection Time: 03/16/15  9:25 AM  Result Value Ref Range   Sodium 132 (L) 135 - 145 mmol/L   Potassium 3.0 (L) 3.5 - 5.1 mmol/L   Chloride 93 (L) 96 - 112 mmol/L   BUN 40 (H) 6 - 23 mg/dL   Creatinine, Ser 1.50 (H) 0.50 - 1.10 mg/dL   Glucose, Bld 309 (H) 70 - 99 mg/dL   Calcium, Ion 1.03 (L) 1.13 - 1.30 mmol/L   TCO2 23 0 - 100 mmol/L   Hemoglobin 11.6 (L) 12.0 - 15.0 g/dL   HCT 34.0 (L)  36.0 - 46.0 %     Radiology/Studies: Dg Chest 2 View  02/16/2015   CLINICAL DATA:  Pulmonary edema  EXAM: CHEST  2 VIEW  COMPARISON:  02/14/2015  FINDINGS: Cardiomediastinal silhouette is stable. No pulmonary edema. There is small left pleural effusion with left basilar atelectasis or infiltrate.  IMPRESSION: No pulmonary edema. Small left pleural effusion with left basilar atelectasis or infiltrate.   Electronically Signed   By: Lahoma Crocker M.D.   On: 02/16/2015 15:13   US Renal  02/15/2015   CLINICAL DATA:  Acute renal failure  EXAM: RENAL/URINARY TRACT ULTRASOUND COMPLETE  COMPARISON:  09/21/2012  FINDINGS: Right Kidney:  Length: 12 cm. Diffuse mild increase in cortical echogenicity with thinning. No hydronephrosis.  Left Kidney:  Length: 10 cm. Diffuse mild increase in cortical echogenicity with thinning. No hydronephrosis   Bladder:  Appears normal for degree of bladder distention.  IMPRESSION: 1. Medical renal disease and mild left renal atrophy. 2. No hydronephrosis.   Electronically Signed   By: Monte Fantasia M.D.   On: 02/15/2015 10:36   Nm Myocar Multi W/spect W/wall Motion / Ef  02/16/2015   CLINICAL DATA:  81 year old female with a history of elevated troponin.  Cardiovascular risk factors include hypertension, hyperlipidemia.  EXAM: MYOCARDIAL IMAGING WITH SPECT (REST AND PHARMACOLOGIC-STRESS)  GATED LEFT VENTRICULAR WALL MOTION STUDY  LEFT VENTRICULAR EJECTION FRACTION  TECHNIQUE: Standard myocardial SPECT imaging was performed after resting intravenous injection of 10 mCi Tc-81m sestamibi. Subsequently, intravenous infusion of Lexiscan was performed under the supervision of the Cardiology staff. At peak effect of the drug, 30 mCi Tc-83m sestamibi was injected intravenously and standard myocardial SPECT imaging was performed. Quantitative gated imaging was also performed to evaluate left ventricular wall motion, and estimate left ventricular ejection fraction.  COMPARISON:  None.  FINDINGS: Perfusion: Fixed defect at the anterior septal wall in the mid ventricle. No reversible defect identified.  Wall Motion: Global hypokinesia.  Left Ventricular Ejection Fraction: 22 %  End diastolic volume 440 ml  End systolic volume 102 ml  IMPRESSION: 1. No reversible ischemia, though there is a fixed defect in the anterior septal wall in the mid ventricle.  2. Global hypokinesis.  3. Left ventricular ejection fraction 22%  4. High-risk stress test findings*, defined by the patient's severely abnormal end-diastolic volume and ejection fraction.  *2012 Appropriate Use Criteria for Coronary Revascularization Focused Update: J Am Coll Cardiol. 7253;66(4):403-474. http://content.airportbarriers.com.aspx?articleid=1201161   Electronically Signed   By: Corrie Mckusick D.O.   On: 02/16/2015 13:37   Dg Chest Portable 1 View   03/16/2015    CLINICAL DATA:  c/o of shortness of breath that has been ongoing since Wednesday and worsened tonight. With EMS, patient was diaphoretic, faint and SOB. Room Air patient was 88%, placed on CPAP O2 at 94. HX:HTN, dm  EXAM: PORTABLE CHEST - 1 VIEW  COMPARISON:  02/16/2015  FINDINGS: There is bilateral irregular interstitial thickening most prominent in the lung bases. Small pleural effusions. No pneumothorax.  Cardiac silhouette is normal in size and configuration. No mediastinal or hilar masses.  Bony thorax is intact.  IMPRESSION: Irregular interstitial thickening with small effusions is consistent with interstitial edema. This may be on the basis of congestive heart failure or noncardiogenic in origin. Appearance is similar to a chest radiograph dated 02/14/2015. No convincing pneumonia.   Electronically Signed   By: Lajean Manes M.D.   On: 03/16/2015 07:51    EKG:NSR, ST LBBB  ASSESSMENT AND PLAN:  Principal  Problem:   Acute respiratory failure with hypoxia Active Problems:   Acute combined systolic and diastolic congestive heart failure   Cardiomyopathy- etiology undetermined. EF 30-35% echo 02/16/15.    Essential hypertension   Type 2 diabetes mellitus with renal manifestations   Elevated troponin-scar, no ischemia by Myoview 02/16/15   Mitral valve regurgitation-moderate tro severe by echo 02/16/15   Chronic renal disease, stage III   Obesity-BMI 29   Hyperlipidemia   LBBB (left bundle branch block)   PLAN: I gave her another 40 mg of Lasix IV. Will review with MD- ? If she was inadequately diuresed last admission, or she has severe CAD with ischemic CHF.    Henri Medal, PA-C 03/16/2015, 10:10 AM   Patient seen and examined with Kerin Ransom PA-C. We discussed all aspects of the encounter. I agree with the assessment and plan as stated above.   She has recurrent severe HF with respiratory distress requiring bipap.Now improved. Volume status doe not look that bad. + s3.  I am  concerned for ischemic CM with low output. Will admit to [place] [facility]. Cut carvedilol back to 3.125 bid. Start hydralazine/nitrates. Gentle diuresis. Place PICC for CVP and co-ox. Will need R and L cath this admit. Avoid ACE/ARB currently given CKD and need for cath.   Julina Altmann,MD 10:38 AM

## 2015-03-16 NOTE — ED Notes (Addendum)
EMS - Patient coming from home with c/o of shortness of breath that has been ongoing since Wednesday and worsened tonight.  With EMS, patient was diaphoretic, faint and SOB.  Room Air patient was 88%, placed on CPAP O2 at 94%.  Patient is alert and oriented.  Patient was seen by MD on 03/01/15 Servando Snare and diagnosed with allergies, added Norvasc 2.5mg /day and increased Coreg to 1 1/2 pills twice a day.  At MD office referred to nephrology regarding kidney function.  Given 1 Nitro with EMS at 06:19.  194/99, 94 HR, 24 R and 246 CBG.    ED - Room air is 77%.

## 2015-03-16 NOTE — ED Notes (Signed)
Dr. Zavitz at the bedside.  

## 2015-03-16 NOTE — ED Notes (Signed)
Attempted report 

## 2015-03-16 NOTE — ED Notes (Signed)
Cardiology PA at the bedside. bipap taken off and placed on nasal cannula at 3 liters by RT.

## 2015-03-16 NOTE — ED Notes (Signed)
RT note: Pt. seen upon initial rounds is tolerating current settings well, RN/MD @ bedside with Xray tech @ door for chest film. RT to monitor.

## 2015-03-16 NOTE — ED Provider Notes (Signed)
CSN: [id number]     Arrival date & time 03/16/15  4765 History   First MD Initiated Contact with Patient 03/16/15 0703     Chief Complaint  Patient presents with  . Shortness of Breath     (Consider location/radiation/quality/duration/timing/severity/associated sxs/prior Treatment) HPI Comments: 81 year old female with high blood pressure, congestive heart failure, mitral valve regurgitation, diabetes presents for worsening shortness of breath since Wednesday acutely worsened in the last 12 hours. Worse with lying flat, nonproductive cough. This was similar to her pulmonary edema history. Patient on Lasix 20 mg daily, no significant change in medication. Patient is been followed in her home since last discharge and was doing well prior. No fevers or chills. No chest pain. Patient follows Dr. Radford Pax cardiology. Patient feels improved on BiPAP mask.  Patient is a 81 y.o. female presenting with shortness of breath. The history is provided by the patient, medical records, the EMS personnel and the spouse.  Shortness of Breath Associated symptoms: cough   Associated symptoms: no abdominal pain, no chest pain, no fever, no headaches, no neck pain, no rash and no vomiting     Past Medical History  Diagnosis Date  . Atrophic vaginitis   . Yeast infection   . Glaucoma   . Cataracts, bilateral   . Diabetes mellitus without complication 02/6502  . Hyperlipidemia   . Hypertension    Past Surgical History  Procedure Laterality Date  . Vaginal delivery      x3  . Colonoscopy  05/2003  . Finger surgery    . Cataract extraction w/ intraocular lens implant Right 2011    1 year later the left side done  . Colonoscopy  06/2008    Dr. Lajoyce Corners recheck in 5 years  . Total vaginal hysterectomy  1978    for endometriosis  . Appendectomy  1950  . Squamous cell carcinoma excision  6/15    Nose   Family History  Problem Relation Age of Onset  . Heart disease  67  . CAD Brother   . Hypertension Brother     History  Substance Use Topics  . Smoking status: Former Smoker -- 0.10 packs/day for 2 years    Types: Cigarettes  . Smokeless tobacco: Never Used  . Alcohol Use: No   OB History    Gravida Para Term Preterm AB TAB SAB Ectopic Multiple Living   4 3   1     3      Review of Systems  Constitutional: Negative for fever, chills and unexpected weight change.  HENT: Negative for congestion.   Eyes: Negative for visual disturbance.  Respiratory: Positive for cough and shortness of breath.   Cardiovascular: Negative for chest pain.  Gastrointestinal: Negative for vomiting and abdominal pain.  Genitourinary: Negative for dysuria and flank pain.  Musculoskeletal: Negative for back pain, neck pain and neck stiffness.  Skin: Negative for rash.  Neurological: Negative for light-headedness and headaches.      Allergies  Alphagan; Azopt; Cefdinir; Macrobid; and Sulfa antibiotics  Home Medications   Prior to Admission medications   Medication Sig Start Date End Date Taking? Authorizing Provider  ACCU-CHEK AVIVA PLUS test strip  04/27/13   Historical Provider, MD  amLODipine (NORVASC) 2.5 MG tablet Take 1 tablet (2.5 mg total) by mouth daily. 03/01/15   Burtis Junes, NP  Ascorbic Acid (VITAMIN C) 1000 MG tablet Take 1,000 mg by mouth daily.    Historical Provider, MD  aspirin 81 MG tablet Take 81 mg  by mouth daily.    Historical Provider, MD  atorvastatin (LIPITOR) 10 MG tablet Take 10 mg by mouth daily.    Historical Provider, MD  beta carotene 10000 UNIT capsule Take 10,000 Units by mouth daily.    Historical Provider, MD  calcium citrate (CALCITRATE - DOSED IN MG ELEMENTAL CALCIUM) 950 MG tablet Take 1 tablet by mouth daily.    Historical Provider, MD  carvedilol (COREG) 12.5 MG tablet Take 1.5 tablets (18.75 mg total) by mouth 2 (two) times daily with a meal. 03/01/15   Burtis Junes, NP  cholecalciferol (VITAMIN D) 1000 UNITS tablet Take 1,000 Units by mouth daily.    Historical  Provider, MD  conjugated estrogens (PREMARIN) vaginal cream Place vaginally 2 (two) times a week. 05/24/14   [name], FNP  docusate sodium (COLACE) 100 MG capsule Take 1 capsule (100 mg total) by mouth 2 (two) times daily. 02/18/15   Bonnielee Haff, MD  furosemide (LASIX) 20 MG tablet Take 1 tablet (20 mg total) by mouth daily. 03/07/15   Liliane Shi, PA-C  glucosamine-chondroitin 500-400 MG tablet Take 1 tablet by mouth 3 (three) times daily.    Historical Provider, MD  glyBURIDE (DIABETA) 5 MG tablet Take 5 mg by mouth 2 (two) times daily with a meal.     Historical Provider, MD  iron polysaccharides (NIFEREX) 150 MG capsule Take 1 capsule (150 mg total) by mouth 2 (two) times daily. 02/18/15   Bonnielee Haff, MD  L-Methylfolate-B6-B12 (METANX PO) Take by mouth 2 (two) times daily.    Historical Provider, MD  Lancets (ACCU-CHEK MULTICLIX) lancets  02/26/15   Historical Provider, MD  Probiotic Product (PROBIOTIC DAILY PO) Take by mouth. Florastor    Historical Provider, MD  sitaGLIPtin (JANUVIA) 50 MG tablet Take 1 tablet (50 mg total) by mouth daily. 02/18/15   Bonnielee Haff, MD  Travoprost, BAK Free, (TRAVATAN) 0.004 % SOLN ophthalmic solution Place 1 drop into both eyes at bedtime.    Historical Provider, MD  vitamin E 400 UNIT capsule Take 400 Units by mouth daily.    Historical Provider, MD   BP 151/60 mmHg  Pulse 90  Temp(Src) 97.5 F (36.4 C) (Axillary)  Resp 21  Ht 5\' 2"  (1.575 m)  Wt 160 lb 3.2 oz (72.666 kg)  BMI 29.29 kg/m2  SpO2 93%  LMP 07/24/1977 Physical Exam  Constitutional: She is oriented to person, place, and time. She appears well-developed and well-nourished.  HENT:  Head: Normocephalic and atraumatic.  Eyes: Conjunctivae are normal. Right eye exhibits no discharge. Left eye exhibits no discharge.  Neck: Normal range of motion. Neck supple. No tracheal deviation present.  Cardiovascular: Normal rate and regular rhythm.   Pulmonary/Chest: She has rales  (crackles bilateral in  ).  Abdominal: Soft. She exhibits no distension. There is no tenderness. There is no guarding.  Musculoskeletal: She exhibits edema (mild lower extremities bilateral nontender).  Neurological: She is alert and oriented to person, place, and time.  Skin: Skin is warm. No rash noted.  Psychiatric: She is not agitated.  Increased resp effort,   Nursing note and vitals reviewed.   ED Course  Procedures (including critical care time) CRITICAL CARE Performed by: Mariea Clonts   Total critical care time: 40 min  Critical care time was exclusive of separately billable procedures and treating other patients.  Critical care was necessary to treat or prevent imminent or life-threatening deterioration.  Critical care was time spent personally by me on the  following activities: development of treatment plan with patient and/or surrogate as well as nursing, discussions with consultants, evaluation of patient's response to treatment, examination of patient, obtaining history from patient or surrogate, ordering and performing treatments and interventions, ordering and review of laboratory studies, ordering and review of radiographic studies, pulse oximetry and re-evaluation of patient's condition.    EMERGENCY DEPARTMENT [place] CARDIAC EXAM "Study: Limited Ultrasound of the heart and pericardium"  INDICATIONS:Dyspnea Multiple views of the heart and pericardium were obtained in real-time with a multi-frequency probe.  PERFORMED WU:[id number]  IMAGES ARCHIVED?: Yes  FINDINGS: No pericardial effusion, Decreased contractility and IVC dilated  LIMITATIONS:  Body habitus  VIEWS USED: Subcostal 4 chamber, Parasternal long axis, Parasternal short axis, Apical 4 chamber  and Inferior Vena Cava  INTERPRETATION: Cardiac activity present, Pericardial effusioin absent, Cardiac tamponade absent, Probable elevated CVP and Decreased contractility  CPT Code: [phone] (limited  transthoracic cardiac)  Emergency Ultrasound: Limited Thoracic Performed and interpreted by Dr Reather Converse Longitudinal view of anterior and base of left and right lung fields in real-time with linear or phased array probe. Indication: dyspnea Findings: pos lung sliding pos B lines  Pos effusion Interpretation: pulm edema Images electronically archived.   CPT Code: [phone] (limited transthoracic cardiac)     Labs Review Labs Reviewed  CBC WITH DIFFERENTIAL/PLATELET - Abnormal; Notable for the following:    WBC 10.7 (*)    RBC 3.69 (*)    Hemoglobin 10.4 (*)    HCT 31.9 (*)    Neutrophils Relative % 82 (*)    Neutro Abs 8.9 (*)    Lymphocytes Relative 9 (*)    All other components within normal limits  BASIC METABOLIC PANEL - Abnormal; Notable for the following:    Sodium 130 (*)    Chloride 92 (*)    Glucose, Bld 299 (*)    BUN 43 (*)    Creatinine, Ser 1.60 (*)    GFR calc non Af Amer 30 (*)    GFR calc Af Amer 34 (*)    All other components within normal limits  BRAIN NATRIURETIC PEPTIDE - Abnormal; Notable for the following:    B Natriuretic Peptide 648.2 (*)    All other components within normal limits  I-STAT CHEM 8, ED - Abnormal; Notable for the following:    Sodium 132 (*)    Potassium 3.0 (*)    Chloride 93 (*)    BUN 40 (*)    Creatinine, Ser 1.50 (*)    Glucose, Bld 309 (*)    Calcium, Ion 1.03 (*)    Hemoglobin 11.6 (*)    HCT 34.0 (*)    All other components within normal limits  PROTIME-INR  MAGNESIUM  I-STAT TROPOININ, ED  I-STAT TROPOININ, ED    Imaging Review Dg Chest Portable 1 View  03/16/2015   CLINICAL DATA:  c/o of shortness of breath that has been ongoing since Wednesday and worsened tonight. With EMS, patient was diaphoretic, faint and SOB. Room Air patient was 88%, placed on CPAP O2 at 94. HX:HTN, dm  EXAM: PORTABLE CHEST - 1 VIEW  COMPARISON:  02/16/2015  FINDINGS: There is bilateral irregular interstitial thickening most prominent in the  lung bases. Small pleural effusions. No pneumothorax.  Cardiac silhouette is normal in size and configuration. No mediastinal or hilar masses.  Bony thorax is intact.  IMPRESSION: Irregular interstitial thickening with small effusions is consistent with interstitial edema. This may be on the basis of congestive heart failure or noncardiogenic  in origin. Appearance is similar to a chest radiograph dated 02/14/2015. No convincing pneumonia.   Electronically Signed   By: Lajean Manes M.D.   On: 03/16/2015 07:51     EKG Interpretation   Date/Time:  Saturday March 16 2015 06:36:25 EDT Ventricular Rate:  97 PR Interval:  165 QRS Duration: 160 QT Interval:  446 QTC Calculation: 567 R Axis:   66 Text Interpretation:  Left bundle branch block No significant change since  last tracing Confirmed by Glynn Octave [phone]) on 03/16/2015  6:59:29 AM      MDM   Final diagnoses:  Acute respiratory failure with hypoxia  Hypertensive emergency  Acute dyspnea  Hypokalemia    patient with heart failure history presents with acute worsening of breathing  Clinically concern for coupon edema with crackles at the bases, B-lines and bedside ultrasound decreased ejection fraction bedside ultrasound. Patient improved with BiPAP, nitro drip ordered and titrated. Blood work delayed , nursing looking into it.   Chest x-ray reviewed consistent with pulmonary edema.  Lasix given, on recheck patient improved. [facility] cardiology for stepdown admission.   discussed with cardiology for admission. IV potassium ordered. The patients results and plan were reviewed and discussed.   Any x-rays performed were personally reviewed by myself.   Differential diagnosis were considered with the presenting HPI.  Medications  nitroGLYCERIN 50 mg in dextrose 5 % 250 mL (0.2 mg/mL) infusion (100 mcg/min Intravenous Rate/Dose Change 03/16/15 0844)  potassium chloride 10 mEq in 100 mL IVPB (not administered)  potassium  chloride 10 mEq in 100 mL IVPB (not administered)  furosemide (LASIX) injection 20 mg (20 mg Intravenous Given 03/16/15 0732)    Filed Vitals:   03/16/15 0900 03/16/15 0915 03/16/15 0930 03/16/15 0940  BP: 168/67 166/65 151/60   Pulse: 91 92 90   Temp:      TempSrc:      Resp: 16 16 21    Height:      Weight:      SpO2: 95% 96% 97% 93%    Final diagnoses:  Acute respiratory failure with hypoxia  Hypertensive emergency  Acute dyspnea  Hypokalemia    Admission/ observation were discussed with the admitting physician, patient and/or family and they are comfortable with the plan.     Elnora Morrison, MD 03/16/15 [phone]

## 2015-03-16 NOTE — ED Notes (Signed)
Pt taken off the bedpan. Unable to do anything at this time.

## 2015-03-17 ENCOUNTER — Inpatient Hospital Stay (HOSPITAL_COMMUNITY): Payer: Medicare Other

## 2015-03-17 DIAGNOSIS — N183 Chronic kidney disease, stage 3 (moderate): Secondary | ICD-10-CM

## 2015-03-17 LAB — BASIC METABOLIC PANEL
Anion gap: 11 (ref 5–15)
Anion gap: 12 (ref 5–15)
BUN: 34 mg/dL — AB (ref 6–23)
BUN: 34 mg/dL — ABNORMAL HIGH (ref 6–23)
CHLORIDE: 97 mmol/L (ref 96–112)
CHLORIDE: 93 mmol/L — AB (ref 96–112)
CO2: 27 mmol/L (ref 19–32)
CO2: 28 mmol/L (ref 19–32)
Calcium: 8.3 mg/dL — ABNORMAL LOW (ref 8.4–10.5)
Calcium: 8.3 mg/dL — ABNORMAL LOW (ref 8.4–10.5)
Creatinine, Ser: 1.53 mg/dL — ABNORMAL HIGH (ref 0.50–1.10)
Creatinine, Ser: 1.62 mg/dL — ABNORMAL HIGH (ref 0.50–1.10)
GFR calc Af Amer: 36 mL/min — ABNORMAL LOW (ref >90)
GFR calc Af Amer: 34 mL/min — ABNORMAL LOW (ref >90)
GFR calc non Af Amer: 31 mL/min — ABNORMAL LOW (ref >90)
GFR calc non Af Amer: 29 mL/min — ABNORMAL LOW (ref >90)
Glucose, Bld: 131 mg/dL — ABNORMAL HIGH (ref 70–99)
Glucose, Bld: 263 mg/dL — ABNORMAL HIGH (ref 70–99)
Potassium: 2.8 mmol/L — ABNORMAL LOW (ref 3.5–5.1)
Potassium: 3.5 mmol/L (ref 3.5–5.1)
Sodium: 135 mmol/L (ref 135–145)
Sodium: 133 mmol/L — ABNORMAL LOW (ref 135–145)

## 2015-03-17 LAB — CARBOXYHEMOGLOBIN
Carboxyhemoglobin: 1.1 % (ref 0.5–1.5)
METHEMOGLOBIN: 1.3 % (ref 0.0–1.5)
O2 Saturation: 76.7 %
TOTAL HEMOGLOBIN: 10.1 g/dL — AB (ref 12.0–16.0)

## 2015-03-17 LAB — CK TOTAL AND CKMB (NOT AT ARMC)
CK, MB: 4 ng/mL (ref 0.3–4.0)
RELATIVE INDEX: INVALID (ref 0.0–2.5)
Total CK: 92 U/L (ref 7–177)

## 2015-03-17 LAB — GLUCOSE, CAPILLARY
Glucose-Capillary: 241 mg/dL — ABNORMAL HIGH (ref 70–99)
Glucose-Capillary: 257 mg/dL — ABNORMAL HIGH (ref 70–99)
Glucose-Capillary: 204 mg/dL — ABNORMAL HIGH (ref 70–99)
Glucose-Capillary: 213 mg/dL — ABNORMAL HIGH (ref 70–99)

## 2015-03-17 LAB — TROPONIN I
Troponin I: 0.12 ng/mL — ABNORMAL HIGH (ref <0.031)
Troponin I: 0.12 ng/mL — ABNORMAL HIGH (ref <0.031)

## 2015-03-17 LAB — HEPARIN LEVEL (UNFRACTIONATED): Heparin Unfractionated: 0.3 IU/mL (ref 0.30–0.70)

## 2015-03-17 MED ORDER — METOLAZONE 5 MG PO TABS
5.0000 mg | ORAL_TABLET | Freq: Once | ORAL | Status: AC
  Administered 2015-03-17: 5 mg via ORAL
  Filled 2015-03-17: qty 1

## 2015-03-17 MED ORDER — HEPARIN BOLUS VIA INFUSION
4000.0000 [IU] | Freq: Once | INTRAVENOUS | Status: AC
  Administered 2015-03-17: 4000 [IU] via INTRAVENOUS
  Filled 2015-03-17: qty 4000

## 2015-03-17 MED ORDER — POTASSIUM CHLORIDE ER 10 MEQ PO TBCR
20.0000 meq | EXTENDED_RELEASE_TABLET | Freq: Two times a day (BID) | ORAL | Status: DC
  Administered 2015-03-17 – 2015-03-18 (×3): 20 meq via ORAL
  Filled 2015-03-17 (×5): qty 2

## 2015-03-17 MED ORDER — POTASSIUM CHLORIDE CRYS ER 20 MEQ PO TBCR
40.0000 meq | EXTENDED_RELEASE_TABLET | Freq: Once | ORAL | Status: AC
  Administered 2015-03-17: 40 meq via ORAL
  Filled 2015-03-17: qty 2

## 2015-03-17 MED ORDER — SODIUM CHLORIDE 0.9 % IJ SOLN
10.0000 mL | Freq: Two times a day (BID) | INTRAMUSCULAR | Status: DC
  Administered 2015-03-17 – 2015-03-24 (×11): 10 mL
  Administered 2015-03-25: 30 mL
  Administered 2015-03-25 – 2015-04-02 (×13): 10 mL

## 2015-03-17 MED ORDER — HYDRALAZINE HCL 20 MG/ML IJ SOLN
INTRAMUSCULAR | Status: AC
  Filled 2015-03-17: qty 1

## 2015-03-17 MED ORDER — SODIUM CHLORIDE 0.9 % IJ SOLN
3.0000 mL | Freq: Two times a day (BID) | INTRAMUSCULAR | Status: DC
  Administered 2015-03-17: 3 mL via INTRAVENOUS

## 2015-03-17 MED ORDER — ASPIRIN 81 MG PO CHEW
81.0000 mg | CHEWABLE_TABLET | ORAL | Status: AC
Start: 2015-03-18 — End: 2015-03-18
  Administered 2015-03-18: 81 mg via ORAL

## 2015-03-17 MED ORDER — HEPARIN (PORCINE) IN NACL 100-0.45 UNIT/ML-% IJ SOLN
850.0000 [IU]/h | INTRAMUSCULAR | Status: DC
  Administered 2015-03-17: 750 [IU]/h via INTRAVENOUS
  Filled 2015-03-17 (×2): qty 250

## 2015-03-17 MED ORDER — SODIUM CHLORIDE 0.9 % IV SOLN: 250.0000 mL | INTRAVENOUS | Status: DC | PRN

## 2015-03-17 MED ORDER — POTASSIUM CHLORIDE CRYS ER 10 MEQ PO TBCR
EXTENDED_RELEASE_TABLET | ORAL | Status: AC
  Administered 2015-03-17: 40 meq
  Filled 2015-03-17: qty 4

## 2015-03-17 MED ORDER — SODIUM CHLORIDE 0.9 % IJ SOLN
10.0000 mL | INTRAMUSCULAR | Status: DC | PRN
  Administered 2015-03-27: 10 mL
  Administered 2015-03-29: 20 mL
  Filled 2015-03-17 (×2): qty 40

## 2015-03-17 MED ORDER — FUROSEMIDE 10 MG/ML IJ SOLN
INTRAMUSCULAR | Status: AC
Start: 2015-03-17 — End: 2015-03-17
  Administered 2015-03-17: 40 mg
  Filled 2015-03-17: qty 4

## 2015-03-17 MED ORDER — POTASSIUM CHLORIDE CRYS ER 20 MEQ PO TBCR: 40.0000 meq | EXTENDED_RELEASE_TABLET | Freq: Once | ORAL | Status: AC

## 2015-03-17 MED ORDER — HYDRALAZINE HCL 20 MG/ML IJ SOLN
10.0000 mg | Freq: Once | INTRAMUSCULAR | Status: AC
  Administered 2015-03-17: 10 mg via INTRAVENOUS

## 2015-03-17 MED ORDER — FUROSEMIDE 10 MG/ML IJ SOLN
40.0000 mg | Freq: Once | INTRAMUSCULAR | Status: AC
  Administered 2015-03-17: 40 mg via INTRAVENOUS

## 2015-03-17 MED ORDER — FUROSEMIDE 10 MG/ML IJ SOLN
80.0000 mg | Freq: Two times a day (BID) | INTRAMUSCULAR | Status: DC
  Administered 2015-03-17 – 2015-03-18 (×2): 80 mg via INTRAVENOUS
  Filled 2015-03-17 (×4): qty 8

## 2015-03-17 MED ORDER — HEPARIN (PORCINE) IN NACL 100-0.45 UNIT/ML-% IJ SOLN
INTRAMUSCULAR | Status: AC
  Filled 2015-03-17: qty 250

## 2015-03-17 MED ORDER — SODIUM CHLORIDE 0.9 % IV SOLN
250.0000 mL | INTRAVENOUS | Status: DC | PRN
Start: 2015-03-17 — End: 2015-03-18

## 2015-03-17 MED ORDER — ASPIRIN EC 81 MG PO TBEC
81.0000 mg | DELAYED_RELEASE_TABLET | Freq: Every day | ORAL | Status: DC
  Administered 2015-03-18 – 2015-04-02 (×16): 81 mg via ORAL
  Filled 2015-03-17 (×15): qty 1

## 2015-03-17 NOTE — Progress Notes (Signed)
Peripherally Inserted Central Catheter/Midline Placement  The IV Nurse has discussed with the patient and/or persons authorized to consent for the patient, the purpose of this procedure and the potential benefits and risks involved with this procedure.  The benefits include less needle sticks, lab draws from the catheter and patient may be discharged home with the catheter.  Risks include, but not limited to, infection, bleeding, blood clot (thrombus formation), and puncture of an artery; nerve damage and irregular heat beat.  Alternatives to this procedure were also discussed.  PICC/Midline Placement Documentation  PICC / Midline Double Lumen 03/17/15 PICC Right Cephalic 36 cm 0 cm (Active)  Indication for Insertion or Continuance of Line Vasoactive infusions 03/17/2015 12:46 PM  Exposed Catheter (cm) 0 cm 03/17/2015 12:46 PM  Site Assessment Clean;Dry;Intact 03/17/2015 12:46 PM  Lumen #1 Status Flushed;Saline locked;Blood return noted 03/17/2015 12:46 PM  Lumen #2 Status Flushed;Saline locked;Blood return noted 03/17/2015 12:46 PM  Dressing Change Due 03/24/15 03/17/2015 12:46 PM       Gordan Payment 03/17/2015, 12:47 PM

## 2015-03-17 NOTE — Progress Notes (Signed)
Patient c/o not being able to get her breath. Patient wearing O2 at 4L/[place] sats 96/97%. Patient lung sounds with crackles thru out. Lasix 40mg  given early, scheduled for 0900 given at 0700. "I feel like I'm about to pass out." Patient very diaphertic. Blood sugar 241. Crystal day RN came to access and called Dr. Haroldine Laws. New orders received. Foley cath placed with 600 ml output of clear yellow urine. Additional 40mg  of Lasix given, Hydralazine 10mg  given IV,Metrolzone 5mg  given PO, NTG increased. Respiratory team came to access and placed patient on Bi-Pap. Patient resting better now, but remains ST with LBBB. EKG done with patient being so diaphertic.  Will continue to monitor closely.

## 2015-03-17 NOTE — Progress Notes (Signed)
Advanced Heart Failure Rounding Note   Subjective:    Admitted 4/23 with recurrent HF. This am developed severe respiratory distress after getting up to go to bathroom. Required Bipap. Improved with IV NTG and lasix. Diuresing well.   Denies CP or SOB.   Objective:   Weight Range:  Vital Signs:   Temp:  [98.1 F (36.7 C)-98.4 F (36.9 C)] 98.4 F (36.9 C) (04/24 1141) Pulse Rate:  [85-139] 102 (04/24 1141) Resp:  [17-31] 22 (04/24 1141) BP: (108-195)/(37-117) 158/75 mmHg (04/24 1141) SpO2:  [94 %-99 %] 96 % (04/24 1141) Weight:  [72 kg (158 lb 11.7 oz)] 72 kg (158 lb 11.7 oz) (04/24 0500)    Weight change: Filed Weights   03/16/15 0730 03/16/15 1130 03/17/15 0500  Weight: 72.666 kg (160 lb 3.2 oz) 71.9 kg (158 lb 8.2 oz) 72 kg (158 lb 11.7 oz)    Intake/Output:   Intake/Output Summary (Last 24 hours) at 03/17/15 1143 Last data filed at 03/17/15 1000  Gross per 24 hour  Intake 511.66 ml  Output   3175 ml  Net -2663.34 ml     Physical Exam: General:  Elderly No resp difficulty. Lying flat HEENT: normal Neck: supple. JVP 6=-7 . Carotids 2+ bilat; no bruits. No lymphadenopathy or thryomegaly appreciated. Cor: PMI nondisplaced. Regular rate & rhythm. No rubs, gallops or murmurs. Lungs: clear Abdomen: soft, nontender, nondistended. No hepatosplenomegaly. No bruits or masses. Good bowel sounds. Extremities: no cyanosis, clubbing, rash, edema Neuro: alert & orientedx3, cranial nerves grossly intact. moves all 4 extremities w/o difficulty. Affect pleasant  Telemetry: Sinus tach 105  Labs: Basic Metabolic Panel:  Recent Labs Lab 03/16/15 0650 03/16/15 0840 03/16/15 0925 03/17/15 0243  NA 130*  --  132* 135  K 3.9  --  3.0* 2.8*  CL 92*  --  93* 97  CO2 24  --   --  27  GLUCOSE 299*  --  309* 131*  BUN 43*  --  40* 34*  CREATININE 1.60*  --  1.50* 1.53*  CALCIUM 8.5  --   --  8.3*  MG  --  2.1  --   --     Liver Function Tests: No results for input(s):  AST, ALT, ALKPHOS, BILITOT, PROT, ALBUMIN in the last 168 hours. No results for input(s): LIPASE, AMYLASE in the last 168 hours. No results for input(s): AMMONIA in the last 168 hours.  CBC:  Recent Labs Lab 03/16/15 0650 03/16/15 0925  WBC 10.7*  --   NEUTROABS 8.9*  --   HGB 10.4* 11.6*  HCT 31.9* 34.0*  MCV 86.4  --   PLT 318  --     Cardiac Enzymes: No results for input(s): CKTOTAL, CKMB, CKMBINDEX, TROPONINI in the last 168 hours.  BNP: BNP (last 3 results)  Recent Labs  02/14/15 0247 03/16/15 0650  BNP 1346.9* 648.2*    ProBNP (last 3 results)  Recent Labs  03/01/15 0840  PROBNP 742.0*      Other results:  Imaging: Dg Chest Port 1 View  03/17/2015   CLINICAL DATA:  Respiratory distress.  EXAM: PORTABLE CHEST - 1 VIEW  COMPARISON:  03/16/2015.  FINDINGS: Normal sized heart. The pulmonary vasculature and interstitial markings remain prominent. Small bilateral pleural effusions without significant change. Increased left lower lobe airspace opacity and mild patchy opacity at the right lung base. Mild thoracic spine degenerative changes.  IMPRESSION: 1. Interval bibasilar airspace opacity. This could represent atelectasis, pneumonia or alveolar edema. 2. Otherwise,  stable changes of congestive heart failure.   Electronically Signed   By: Claudie Revering M.D.   On: 03/17/2015 08:22   Dg Chest Portable 1 View  03/16/2015   CLINICAL DATA:  c/o of shortness of breath that has been ongoing since Wednesday and worsened tonight. With EMS, patient was diaphoretic, faint and SOB. Room Air patient was 88%, placed on CPAP O2 at 94. HX:HTN, dm  EXAM: PORTABLE CHEST - 1 VIEW  COMPARISON:  02/16/2015  FINDINGS: There is bilateral irregular interstitial thickening most prominent in the lung bases. Small pleural effusions. No pneumothorax.  Cardiac silhouette is normal in size and configuration. No mediastinal or hilar masses.  Bony thorax is intact.  IMPRESSION: Irregular interstitial  thickening with small effusions is consistent with interstitial edema. This may be on the basis of congestive heart failure or noncardiogenic in origin. Appearance is similar to a chest radiograph dated 02/14/2015. No convincing pneumonia.   Electronically Signed   By: Lajean Manes M.D.   On: 03/16/2015 07:51      Medications:     Scheduled Medications: . aspirin EC  81 mg Oral Daily  . atorvastatin  10 mg Oral Daily  . calcium citrate  1 tablet Oral Daily  . carvedilol  3.125 mg Oral BID WC  . cholecalciferol  1,000 Units Oral Daily  . docusate sodium  100 mg Oral BID  . folic acid-pyridoxine-cyancobalamin  1 tablet Oral BID  . glyBURIDE  5 mg Oral BID WC  . heparin  5,000 Units Subcutaneous 3 times per day  . hydrALAZINE  12.5 mg Oral 3 times per day  . insulin aspart  0-5 Units Subcutaneous QHS  . insulin aspart  0-9 Units Subcutaneous TID WC  . iron polysaccharides  150 mg Oral BID  . latanoprost  1 drop Both Eyes QHS  . linagliptin  5 mg Oral Daily  . sodium chloride  3 mL Intravenous Q12H  . vitamin C  1,000 mg Oral Daily  . vitamin E  400 Units Oral Daily     Infusions: . nitroGLYCERIN 50 mcg/min (03/17/15 1000)     PRN Medications:  sodium chloride, acetaminophen, ALPRAZolam, nitroGLYCERIN, ondansetron (ZOFRAN) IV, sodium chloride, zolpidem   Assessment:   1. Acute respiratory failure 2. Acute on chronic systolic HF, EF 42-68% 3. CKD stage 3 4. DM2 5. LBBB 6. HTN 7. Hypokalemia  Plan/Discussion:    She continues to have episodes of flash pulmonary edema. I am very worried that she has severe underlying CAD with ischemic CM. Will continue IV lasix, NTG. Supp K+. Plan R/L cath tomorrow. We discussed risks or worsening renal failure.   Place PICC today to more closely follow CVPs and co-ox. Will start heparin.   Length of Stay: 1   [name] MD 03/17/2015, 11:43 AM  Advanced Heart Failure Team Pager [phone] (M-F; [place])  Please contact  [facility] for night-coverage after hours (4p -7a ) and weekends on [email or web address]

## 2015-03-17 NOTE — Consult Note (Addendum)
ANTICOAGULATION CONSULT NOTE - Initial Consult  Pharmacy Consult for heparin Indication: chest pain/ACS/STEMI  Allergies  Allergen Reactions  . Alphagan [Brimonidine] Other (See Comments)    Redness in the eyes  . Azopt [Brinzolamide]     Redness in the eyes  . Cefdinir Diarrhea  . Macrobid [Nitrofurantoin Macrocrystal] Nausea And Vomiting    Fever, Chills  . Sulfa Antibiotics Nausea And Vomiting and Other (See Comments)    Fever,chills    Patient Measurements: Height: 5\' 2"  (157.5 cm) Weight: 158 lb 11.7 oz (72 kg) IBW/kg (Calculated) : 50.1 Heparin Dosing Weight:65kg  Vital Signs: Temp: 98.4 F (36.9 C) (04/24 1141) Temp Source: Oral (04/24 1141) BP: 170/83 mmHg (04/24 1200) Pulse Rate: 106 (04/24 1200)  Labs:  Recent Labs  03/16/15 0650 03/16/15 0925 03/17/15 0243  HGB 10.4* 11.6*  --   HCT 31.9* 34.0*  --   PLT 318  --   --   LABPROT 14.4  --   --   INR 1.10  --   --   CREATININE 1.60* 1.50* 1.53*    Estimated Creatinine Clearance: 27.7 mL/min (by C-G formula based on Cr of 1.53).   Medical History: Past Medical History  Diagnosis Date  . Atrophic vaginitis   . Yeast infection   . Glaucoma   . Cataracts, bilateral   . Type 2 diabetes, controlled, with renal manifestation 01/2000  . Hyperlipidemia   . Hypertension   . Congestive heart failure   . Cardiomyopathy   . LBBB (left bundle branch block)   . Chronic renal disease, stage III     Medications:  Scheduled:  . aspirin EC  81 mg Oral Daily  . atorvastatin  10 mg Oral Daily  . calcium citrate  1 tablet Oral Daily  . carvedilol  3.125 mg Oral BID WC  . cholecalciferol  1,000 Units Oral Daily  . docusate sodium  100 mg Oral BID  . folic acid-pyridoxine-cyancobalamin  1 tablet Oral BID  . furosemide  80 mg Intravenous BID  . glyBURIDE  5 mg Oral BID WC  . heparin  5,000 Units Subcutaneous 3 times per day  . hydrALAZINE  12.5 mg Oral 3 times per day  . insulin aspart  0-5 Units  Subcutaneous QHS  . insulin aspart  0-9 Units Subcutaneous TID WC  . iron polysaccharides  150 mg Oral BID  . latanoprost  1 drop Both Eyes QHS  . linagliptin  5 mg Oral Daily  . potassium chloride  20 mEq Oral BID  . sodium chloride  3 mL Intravenous Q12H  . vitamin C  1,000 mg Oral Daily  . vitamin E  400 Units Oral Daily    Assessment: 81yoF admitted 4/23 w/ recurrent HF. Today patient with severe respiratory distress, continues to have flash pulmonary edema. MD worried it is due to underlying CAD with ICM. Pharmacy consulted to begin heparin for ACS. Planning for cath tomorrow. Hgb 10.4, plts 318. No bleeding noted.  Goal of Therapy:  Heparin level 0.3-0.7 units/ml Monitor platelets by anticoagulation protocol: Yes   Plan:  Heparin 4000 unit bolus x 1 Heparin gtt at 750 units/hr 8hr HL at 2100 Daily HL/CBC Monitor s/sx of bleeding Dc heparin SQ F/u plans for cath tomorrow  Thank you for allowing pharmacy to be part of this patient's care team  [place], Pharm.D Clinical Pharmacy Resident Pager: [phone] 03/17/2015 .12:32 PM   Addendum:  Heparin level 0.3 on 750 units/hr, low end therapeutic range.  No issues with infusion per RN  Plan: Increase heparin rate slightly to 850 units/hr F/u AM labs  Maryanna Shape, PharmD, BCPS  Clinical Pharmacist  Pager: [phone]

## 2015-03-18 ENCOUNTER — Encounter (HOSPITAL_COMMUNITY)
Admission: EM | Disposition: A | Discharge status: Home Health | Payer: Self-pay | Source: Home / Self Care | Attending: Cardiology

## 2015-03-18 DIAGNOSIS — I701 Atherosclerosis of renal artery: Secondary | ICD-10-CM

## 2015-03-18 DIAGNOSIS — R0602 Shortness of breath: Secondary | ICD-10-CM | POA: Insufficient documentation

## 2015-03-18 DIAGNOSIS — I2510 Atherosclerotic heart disease of native coronary artery without angina pectoris: Secondary | ICD-10-CM

## 2015-03-18 HISTORY — PX: LEFT AND RIGHT HEART CATHETERIZATION WITH CORONARY ANGIOGRAM: SHX5449

## 2015-03-18 LAB — CBC
HCT: 27.9 % — ABNORMAL LOW (ref 36.0–46.0)
HCT: 27.7 % — ABNORMAL LOW (ref 36.0–46.0)
Hemoglobin: 9.1 g/dL — ABNORMAL LOW (ref 12.0–15.0)
Hemoglobin: 9.1 g/dL — ABNORMAL LOW (ref 12.0–15.0)
MCH: 28.3 pg (ref 26.0–34.0)
MCH: 28.2 pg (ref 26.0–34.0)
MCHC: 32.6 g/dL (ref 30.0–36.0)
MCHC: 32.9 g/dL (ref 30.0–36.0)
MCV: 86.6 fL (ref 78.0–100.0)
MCV: 85.8 fL (ref 78.0–100.0)
PLATELETS: 319 10*3/uL (ref 150–400)
Platelets: 319 10*3/uL (ref 150–400)
RBC: 3.22 MIL/uL — ABNORMAL LOW (ref 3.87–5.11)
RBC: 3.23 MIL/uL — ABNORMAL LOW (ref 3.87–5.11)
RDW: 15 % (ref 11.5–15.5)
RDW: 14.8 % (ref 11.5–15.5)
WBC: 10.9 10*3/uL — AB (ref 4.0–10.5)
WBC: 9.5 10*3/uL (ref 4.0–10.5)

## 2015-03-18 LAB — POCT I-STAT 3, VENOUS BLOOD GAS (G3P V)
ACID-BASE EXCESS: 4 mmol/L — AB (ref 0.0–2.0)
Acid-Base Excess: 5 mmol/L — ABNORMAL HIGH (ref 0.0–2.0)
BICARBONATE: 29 meq/L — AB (ref 20.0–24.0)
Bicarbonate: 28 mEq/L — ABNORMAL HIGH (ref 20.0–24.0)
O2 SAT: 55 %
O2 SAT: 57 %
PCO2 VEN: 38.1 mmHg — AB (ref 45.0–50.0)
PH VEN: 7.474 — AB (ref 7.250–7.300)
PO2 VEN: 27 mmHg — AB (ref 30.0–45.0)
TCO2: 29 mmol/L (ref 0–100)
TCO2: 30 mmol/L (ref 0–100)
pCO2, Ven: 37.9 mmHg — ABNORMAL LOW (ref 45.0–50.0)
pH, Ven: 7.492 — ABNORMAL HIGH (ref 7.250–7.300)
pO2, Ven: 27 mmHg — CL (ref 30.0–45.0)

## 2015-03-18 LAB — GLUCOSE, CAPILLARY
GLUCOSE-CAPILLARY: 213 mg/dL — AB (ref 70–99)
Glucose-Capillary: 207 mg/dL — ABNORMAL HIGH (ref 70–99)
Glucose-Capillary: 202 mg/dL — ABNORMAL HIGH (ref 70–99)
Glucose-Capillary: 210 mg/dL — ABNORMAL HIGH (ref 70–99)

## 2015-03-18 LAB — BASIC METABOLIC PANEL
Anion gap: 12 (ref 5–15)
Anion gap: 11 (ref 5–15)
BUN: 33 mg/dL — ABNORMAL HIGH (ref 6–23)
BUN: 29 mg/dL — AB (ref 6–23)
CO2: 29 mmol/L (ref 19–32)
CO2: 29 mmol/L (ref 19–32)
CREATININE: 1.42 mg/dL — AB (ref 0.50–1.10)
Calcium: 8.1 mg/dL — ABNORMAL LOW (ref 8.4–10.5)
Calcium: 8.1 mg/dL — ABNORMAL LOW (ref 8.4–10.5)
Chloride: 91 mmol/L — ABNORMAL LOW (ref 96–112)
Chloride: 91 mmol/L — ABNORMAL LOW (ref 96–112)
Creatinine, Ser: 1.54 mg/dL — ABNORMAL HIGH (ref 0.50–1.10)
GFR calc Af Amer: 40 mL/min — ABNORMAL LOW (ref >90)
GFR calc non Af Amer: 34 mL/min — ABNORMAL LOW (ref >90)
GFR, EST AFRICAN AMERICAN: 36 mL/min — AB (ref >90)
GFR, EST NON AFRICAN AMERICAN: 31 mL/min — AB (ref >90)
Glucose, Bld: 192 mg/dL — ABNORMAL HIGH (ref 70–99)
Glucose, Bld: 220 mg/dL — ABNORMAL HIGH (ref 70–99)
POTASSIUM: 3.1 mmol/L — AB (ref 3.5–5.1)
Potassium: 3.4 mmol/L — ABNORMAL LOW (ref 3.5–5.1)
SODIUM: 132 mmol/L — AB (ref 135–145)
Sodium: 131 mmol/L — ABNORMAL LOW (ref 135–145)

## 2015-03-18 LAB — POCT I-STAT 3, ART BLOOD GAS (G3+)
Acid-Base Excess: 5 mmol/L — ABNORMAL HIGH (ref 0.0–2.0)
Bicarbonate: 27.7 mEq/L — ABNORMAL HIGH (ref 20.0–24.0)
O2 Saturation: 94 %
PH ART: 7.534 — AB (ref 7.350–7.450)
TCO2: 29 mmol/L (ref 0–100)
pCO2 arterial: 32.8 mmHg — ABNORMAL LOW (ref 35.0–45.0)
pO2, Arterial: 61 mmHg — ABNORMAL LOW (ref 80.0–100.0)

## 2015-03-18 LAB — POCT ACTIVATED CLOTTING TIME
Activated Clotting Time: 128 seconds
Activated Clotting Time: 227 seconds
Activated Clotting Time: 153 seconds

## 2015-03-18 LAB — HEPARIN LEVEL (UNFRACTIONATED): Heparin Unfractionated: 0.38 IU/mL (ref 0.30–0.70)

## 2015-03-18 LAB — CREATININE, SERUM
CREATININE: 1.49 mg/dL — AB (ref 0.50–1.10)
GFR calc Af Amer: 37 mL/min — ABNORMAL LOW (ref >90)
GFR calc non Af Amer: 32 mL/min — ABNORMAL LOW (ref >90)

## 2015-03-18 LAB — CARBOXYHEMOGLOBIN
CARBOXYHEMOGLOBIN: 1.1 % (ref 0.5–1.5)
Methemoglobin: 1.2 % (ref 0.0–1.5)
O2 Saturation: 74.2 %
TOTAL HEMOGLOBIN: 9.6 g/dL — AB (ref 12.0–16.0)

## 2015-03-18 SURGERY — LEFT AND RIGHT HEART CATHETERIZATION WITH CORONARY ANGIOGRAM
Anesthesia: LOCAL

## 2015-03-18 MED ORDER — ONDANSETRON HCL 4 MG/2ML IJ SOLN: 4.0000 mg | Freq: Four times a day (QID) | INTRAMUSCULAR | Status: DC | PRN

## 2015-03-18 MED ORDER — LIDOCAINE HCL (PF) 1 % IJ SOLN
INTRAMUSCULAR | Status: AC
  Filled 2015-03-18: qty 30

## 2015-03-18 MED ORDER — ACETAMINOPHEN 325 MG PO TABS: 650.0000 mg | ORAL_TABLET | ORAL | Status: DC | PRN

## 2015-03-18 MED ORDER — HEPARIN SODIUM (PORCINE) 1000 UNIT/ML IJ SOLN
INTRAMUSCULAR | Status: AC
  Filled 2015-03-18: qty 1

## 2015-03-18 MED ORDER — HEPARIN (PORCINE) IN NACL 2-0.9 UNIT/ML-% IJ SOLN
INTRAMUSCULAR | Status: AC
  Filled 2015-03-18: qty 1000

## 2015-03-18 MED ORDER — POTASSIUM CHLORIDE CRYS ER 20 MEQ PO TBCR
40.0000 meq | EXTENDED_RELEASE_TABLET | Freq: Once | ORAL | Status: AC
  Administered 2015-03-18: 40 meq via ORAL

## 2015-03-18 MED ORDER — HEPARIN SODIUM (PORCINE) 5000 UNIT/ML IJ SOLN
5000.0000 [IU] | Freq: Three times a day (TID) | INTRAMUSCULAR | Status: DC
  Administered 2015-03-18 – 2015-03-31 (×36): 5000 [IU] via SUBCUTANEOUS
  Filled 2015-03-18 (×42): qty 1

## 2015-03-18 MED ORDER — SODIUM CHLORIDE 0.9 % IV SOLN
INTRAVENOUS | Status: AC
  Administered 2015-03-18: 15:00:00 via INTRAVENOUS

## 2015-03-18 MED ORDER — ATROPINE SULFATE 0.1 MG/ML IJ SOLN
INTRAMUSCULAR | Status: AC
  Filled 2015-03-18: qty 10

## 2015-03-18 MED ORDER — HYDRALAZINE HCL 25 MG PO TABS
25.0000 mg | ORAL_TABLET | Freq: Three times a day (TID) | ORAL | Status: DC
  Administered 2015-03-18 – 2015-03-19 (×2): 25 mg via ORAL
  Filled 2015-03-18 (×5): qty 1

## 2015-03-18 NOTE — H&P (View-Only) (Signed)
Advanced Heart Failure Rounding Note   Subjective:    Admitted 4/23 with recurrent HF. Yesterday developed severe respiratory distress in setting of flash pulmonary edema.  Required Bipap. Improved with IV NTG and lasix. Diuresed 5L.  Co-ox 74%. CVP 5  Denies CP or SOB.   Objective:   Weight Range:  Vital Signs:   Temp:  [98.3 F (36.8 C)-98.7 F (37.1 C)] 98.3 F (36.8 C) (04/25 0931) Pulse Rate:  [95-108] 102 (04/25 0931) Resp:  [16-27] 20 (04/25 0931) BP: (125-193)/(54-92) 141/67 mmHg (04/25 0931) SpO2:  [94 %-100 %] 98 % (04/25 0931)    Weight change: Filed Weights   03/16/15 0730 03/16/15 1130 03/17/15 0500  Weight: 72.666 kg (160 lb 3.2 oz) 71.9 kg (158 lb 8.2 oz) 72 kg (158 lb 11.7 oz)    Intake/Output:   Intake/Output Summary (Last 24 hours) at 03/18/15 1129 Last data filed at 03/18/15 0900  Gross per 24 hour  Intake 765.32 ml  Output   3625 ml  Net -2859.68 ml     Physical Exam: General:  Elderly No resp difficulty. Lying flat HEENT: normal Neck: supple. JVP 6=-7 . Carotids 2+ bilat; no bruits. No lymphadenopathy or thryomegaly appreciated. Cor: PMI nondisplaced. Regular rate & rhythm. No rubs, gallops or murmurs. Lungs: clear Abdomen: soft, nontender, nondistended. No hepatosplenomegaly. No bruits or masses. Good bowel sounds. Extremities: no cyanosis, clubbing, rash, edema Neuro: alert & orientedx3, cranial nerves grossly intact. moves all 4 extremities w/o difficulty. Affect pleasant  Telemetry: Sinus tach 105  Labs: Basic Metabolic Panel:  Recent Labs Lab 03/16/15 0650 03/16/15 0840 03/16/15 0925 03/17/15 0243 03/17/15 1600 03/18/15 0530  NA 130*  --  132* 135 133* 132*  K 3.9  --  3.0* 2.8* 3.5 3.1*  CL 92*  --  93* 97 93* 91*  CO2 24  --   --  27 28 29   GLUCOSE 299*  --  309* 131* 263* 192*  BUN 43*  --  40* 34* 34* 33*  CREATININE 1.60*  --  1.50* 1.53* 1.62* 1.54*  CALCIUM 8.5  --   --  8.3* 8.3* 8.1*  MG  --  2.1  --   --   --    --     Liver Function Tests: No results for input(s): AST, ALT, ALKPHOS, BILITOT, PROT, ALBUMIN in the last 168 hours. No results for input(s): LIPASE, AMYLASE in the last 168 hours. No results for input(s): AMMONIA in the last 168 hours.  CBC:  Recent Labs Lab 03/16/15 0650 03/16/15 0925 03/18/15 0530  WBC 10.7*  --  10.9*  NEUTROABS 8.9*  --   --   HGB 10.4* 11.6* 9.1*  HCT 31.9* 34.0* 27.9*  MCV 86.4  --  86.6  PLT 318  --  319    Cardiac Enzymes:  Recent Labs Lab 03/17/15 1600 03/17/15 2001  CKTOTAL 92  --   CKMB 4.0  --   TROPONINI 0.12* 0.12*    BNP: BNP (last 3 results)  Recent Labs  02/14/15 0247 03/16/15 0650  BNP 1346.9* 648.2*    ProBNP (last 3 results)  Recent Labs  03/01/15 0840  PROBNP 742.0*      Other results:  Imaging: Dg Chest Port 1 View  03/17/2015   CLINICAL DATA:  Respiratory distress.  EXAM: PORTABLE CHEST - 1 VIEW  COMPARISON:  03/16/2015.  FINDINGS: Normal sized heart. The pulmonary vasculature and interstitial markings remain prominent. Small bilateral pleural effusions without significant change. Increased  left lower lobe airspace opacity and mild patchy opacity at the right lung base. Mild thoracic spine degenerative changes.  IMPRESSION: 1. Interval bibasilar airspace opacity. This could represent atelectasis, pneumonia or alveolar edema. 2. Otherwise, stable changes of congestive heart failure.   Electronically Signed   By: Claudie Revering M.D.   On: 03/17/2015 08:22     Medications:     Scheduled Medications: . [START ON 03/19/2015] aspirin EC  81 mg Oral Daily  . atorvastatin  10 mg Oral Daily  . calcium citrate  1 tablet Oral Daily  . carvedilol  3.125 mg Oral BID WC  . cholecalciferol  1,000 Units Oral Daily  . docusate sodium  100 mg Oral BID  . folic acid-pyridoxine-cyancobalamin  1 tablet Oral BID  . furosemide  80 mg Intravenous BID  . glyBURIDE  5 mg Oral BID WC  . hydrALAZINE  12.5 mg Oral 3 times per day   . insulin aspart  0-5 Units Subcutaneous QHS  . insulin aspart  0-9 Units Subcutaneous TID WC  . iron polysaccharides  150 mg Oral BID  . latanoprost  1 drop Both Eyes QHS  . linagliptin  5 mg Oral Daily  . potassium chloride  20 mEq Oral BID  . sodium chloride  10-40 mL Intracatheter Q12H  . sodium chloride  3 mL Intravenous Q12H  . sodium chloride  3 mL Intravenous Q12H  . vitamin C  1,000 mg Oral Daily  . vitamin E  400 Units Oral Daily    Infusions: . heparin 850 Units/hr (03/18/15 0600)  . nitroGLYCERIN 90 mcg/min (03/18/15 0846)    PRN Medications: sodium chloride, sodium chloride, sodium chloride, acetaminophen, ALPRAZolam, nitroGLYCERIN, ondansetron (ZOFRAN) IV, sodium chloride, sodium chloride, zolpidem   Assessment:   1. Acute respiratory failure 2. Acute on chronic systolic HF, EF 13-14% 3. CKD stage 3 4. DM2 5. LBBB 6. HTN 7. Hypokalemia  Plan/Discussion:    Much improved with diuresis and IV NTG. CVP and co-ox now good. I am very worried that she has severe underlying CAD with ischemic CM. Will continue IV lasix, NTG. Supp K+. Plan R/L cath later today. We discussed risks or worsening renal failure.    Length of Stay: 2 [name] MD 03/18/2015, 11:29 AM  Advanced Heart Failure Team Pager [phone] (M-F; [place])  Please contact [facility] for night-coverage after hours (4p -7a ) and weekends on [email or web address]

## 2015-03-18 NOTE — Progress Notes (Signed)
Katie Fuentes is a 81 y.o. female patient.   Principal Problem:   Acute respiratory failure with hypoxia Active Problems:   Essential hypertension   Obesity-BMI 29   Type 2 diabetes mellitus with renal manifestations   Hyperlipidemia   Elevated troponin-scar, no ischemia by Myoview 02/16/15   Acute combined systolic and diastolic congestive heart failure   Mitral valve regurgitation-moderate tro severe by echo 02/16/15   LBBB (left bundle branch block)   Chronic renal disease, stage III   Cardiomyopathy- etiology undetermined. EF 30-35% echo 02/16/15.    Acute combined systolic and diastolic heart failure  Blood pressure 141/67, pulse 102, temperature 98.3 F (36.8 C), temperature source Oral, resp. rate 20, height 5\' 2"  (1.575 m), weight 72 kg (158 lb 11.7 oz), last menstrual period 07/24/1977, SpO2 98 %.  Subjective: Patient offered help to sit up in chair. Patient stated "she feels a lot better now that she is out of the bed". Feels that her breathing has improved.  Objective: Patient comfortable at rest. No Dyspnea or SOB this a.m. VS stable at this time. Nitro @ 90 mcg. Heparin infusing.  Assessment & Plan: Patient weaned from 5 liters 02 to 3 Liters. SATS 97%. No episodes of Dyspnea or SOB this a.m.  Patient to go for R/L heart cath today. Patient and family educated on the procedure. Consent signed, in chart.   Roxan Hockey 03/18/2015

## 2015-03-18 NOTE — CV Procedure (Signed)
Cardiac Cath Procedure Note  Indication: HF, abnormal nuclear study and severe HTN  Procedures performed:  1) Right heart cathererization 2) Selective coronary angiography 3) Left heart catheterization 4) Bilateral selective renal artery angiography   Description of procedure:     The risks and indication of the procedure were explained. Consent was signed and placed on the chart. An appropriate timeout was taken prior to the procedure. The right groin was prepped and draped in the routine sterile fashion and anesthetized with 1% local lidocaine.   A 5 FR arterial sheath was placed in the right femoral artery using a modified Seldinger technique. Standard catheters including a JL4 and JR4 were used. All catheter exchanges were made over a wire. A 7 FR venous sheath was placed in the right femoral vein using a modified Seldinger technique. A standard Swan-Ganz catheter was used for the procedure.   Complications:  None apparent  Findings:  RA = 2 RV = 35/1/3 PA = 31/11 (21) PCW = 12 Fick cardiac output/index = 3.5/2.8 PVR = 2.5 WU SVR = 2121  FA sat = 94% PA sat = 55%, 57%  Ao Pressure: 144/69 (102) LV Pressure: 144/8/10 There was no signficant gradient across the aortic valve on pullback.  Left main: Heavily calcified throughout with ventricularization of 5FR catheter waveform. 60% stenosis throughout.   LAD: Large vessel wraps apex. Gives off 3 diagonals. Heavily calcified in ostial and proximal portion. 30-40% stenosis proximally and then mild diffuse plaquing through entire vessel.   LCX: Dominant vessel. Gives off 3 small OM-s and several PLs. Ostial calcification with ~50-60% ostial lesion.  RCA: Small nondominant vessel. Normal.   LV-gram: Not done  Right renal artery: Normal  Left renal artery: 95% ostial stenosis  Assessment: 1. Heavily calcified LM lesion that appears to be 60-70% stenosed 2. Severe HTN with 95% left renal artery stenosis 3. Normal  filling pressures with adequate cardiac output. Very high SVR.   Plan/Discussion:  Reviewed with interventional team will plan IVUS of LM to further assess degree of stenosis and need for CABG. Will need aggressive BP control.   Daniel Bensimhon,MD 1:58 PM

## 2015-03-18 NOTE — Progress Notes (Addendum)
ANTICOAGULATION CONSULT NOTE - Follow Up Consult  Pharmacy Consult for heparin Indication: chest pain/ACS  Allergies  Allergen Reactions  . Cefdinir Diarrhea  . Macrobid [Nitrofurantoin Macrocrystal] Nausea And Vomiting and Other (See Comments)    Fever, Chills  . Sulfa Antibiotics Nausea And Vomiting and Other (See Comments)    Fever,chills  . Alphagan [Brimonidine] Other (See Comments)    Redness in the eyes  . Azopt [Brinzolamide]     Redness in the eyes    Patient Measurements: Height: 5\' 2"  (157.5 cm) Weight: 158 lb 11.7 oz (72 kg) IBW/kg (Calculated) : 50.1 Heparin Dosing Weight: 65 kg  Vital Signs: Temp: 98.4 F (36.9 C) (04/25 0000) Temp Source: Oral (04/25 0000) BP: 188/83 mmHg (04/25 0611) Pulse Rate: 96 (04/25 0400)  Labs:  Recent Labs  03/16/15 0650 03/16/15 0925 03/17/15 0243 03/17/15 1600 03/17/15 2001 03/17/15 2059 03/18/15 0521 03/18/15 0530  HGB 10.4* 11.6*  --   --   --   --   --  9.1*  HCT 31.9* 34.0*  --   --   --   --   --  27.9*  PLT 318  --   --   --   --   --   --  319  LABPROT 14.4  --   --   --   --   --   --   --   INR 1.10  --   --   --   --   --   --   --   HEPARINUNFRC  --   --   --   --   --  0.30 0.38  --   CREATININE 1.60* 1.50* 1.53* 1.62*  --   --   --  1.54*  CKTOTAL  --   --   --  92  --   --   --   --   CKMB  --   --   --  4.0  --   --   --   --   TROPONINI  --   --   --  0.12* 0.12*  --   --   --     Estimated Creatinine Clearance: 27.5 mL/min (by C-G formula based on Cr of 1.54).  Assessment: 81yoF admitted 4/23 w/ recurrent HF. Today patient with severe respiratory distress, continues to have flash pulmonary edema. MD worried it is due to underlying CAD with ICM. Pharmacy consulted to begin heparin for ACS. Planning for cath today (4/25). H/H 10.4/31.9 >> 9.1/27.9, plt 319. No bleeding noted. HL therapeutic x2.   Goal of Therapy:  Heparin level 0.3-0.7 units/ml Monitor platelets by anticoagulation protocol: Yes    Plan:  Continue to monitor H&H and platelets  Continue heparin gtt 850 units/h  F/u daily HL, CBC  Monitor s/sx bleeding  F/u plans for heparin after cath  Ginny Forth 03/18/2015,7:25 AM  I have reviewed and agree with above content.  Uvaldo Rising, BCPS  Clinical Pharmacist Pager [phone]  03/18/2015 7:40 AM

## 2015-03-18 NOTE — Progress Notes (Signed)
Advanced Heart Failure Rounding Note   Subjective:    Admitted 4/23 with recurrent HF. Yesterday developed severe respiratory distress in setting of flash pulmonary edema.  Required Bipap. Improved with IV NTG and lasix. Diuresed 5L.  Co-ox 74%. CVP 5  Denies CP or SOB.   Objective:   Weight Range:  Vital Signs:   Temp:  [98.3 F (36.8 C)-98.7 F (37.1 C)] 98.3 F (36.8 C) (04/25 0931) Pulse Rate:  [95-108] 102 (04/25 0931) Resp:  [16-27] 20 (04/25 0931) BP: (125-193)/(54-92) 141/67 mmHg (04/25 0931) SpO2:  [94 %-100 %] 98 % (04/25 0931)    Weight change: Filed Weights   03/16/15 0730 03/16/15 1130 03/17/15 0500  Weight: 72.666 kg (160 lb 3.2 oz) 71.9 kg (158 lb 8.2 oz) 72 kg (158 lb 11.7 oz)    Intake/Output:   Intake/Output Summary (Last 24 hours) at 03/18/15 1129 Last data filed at 03/18/15 0900  Gross per 24 hour  Intake 765.32 ml  Output   3625 ml  Net -2859.68 ml     Physical Exam: General:  Elderly No resp difficulty. Lying flat HEENT: normal Neck: supple. JVP 6=-7 . Carotids 2+ bilat; no bruits. No lymphadenopathy or thryomegaly appreciated. Cor: PMI nondisplaced. Regular rate & rhythm. No rubs, gallops or murmurs. Lungs: clear Abdomen: soft, nontender, nondistended. No hepatosplenomegaly. No bruits or masses. Good bowel sounds. Extremities: no cyanosis, clubbing, rash, edema Neuro: alert & orientedx3, cranial nerves grossly intact. moves all 4 extremities w/o difficulty. Affect pleasant  Telemetry: Sinus tach 105  Labs: Basic Metabolic Panel:  Recent Labs Lab 03/16/15 0650 03/16/15 0840 03/16/15 0925 03/17/15 0243 03/17/15 1600 03/18/15 0530  NA 130*  --  132* 135 133* 132*  K 3.9  --  3.0* 2.8* 3.5 3.1*  CL 92*  --  93* 97 93* 91*  CO2 24  --   --  27 28 29   GLUCOSE 299*  --  309* 131* 263* 192*  BUN 43*  --  40* 34* 34* 33*  CREATININE 1.60*  --  1.50* 1.53* 1.62* 1.54*  CALCIUM 8.5  --   --  8.3* 8.3* 8.1*  MG  --  2.1  --   --   --    --     Liver Function Tests: No results for input(s): AST, ALT, ALKPHOS, BILITOT, PROT, ALBUMIN in the last 168 hours. No results for input(s): LIPASE, AMYLASE in the last 168 hours. No results for input(s): AMMONIA in the last 168 hours.  CBC:  Recent Labs Lab 03/16/15 0650 03/16/15 0925 03/18/15 0530  WBC 10.7*  --  10.9*  NEUTROABS 8.9*  --   --   HGB 10.4* 11.6* 9.1*  HCT 31.9* 34.0* 27.9*  MCV 86.4  --  86.6  PLT 318  --  319    Cardiac Enzymes:  Recent Labs Lab 03/17/15 1600 03/17/15 2001  CKTOTAL 92  --   CKMB 4.0  --   TROPONINI 0.12* 0.12*    BNP: BNP (last 3 results)  Recent Labs  02/14/15 0247 03/16/15 0650  BNP 1346.9* 648.2*    ProBNP (last 3 results)  Recent Labs  03/01/15 0840  PROBNP 742.0*      Other results:  Imaging: Dg Chest Port 1 View  03/17/2015   CLINICAL DATA:  Respiratory distress.  EXAM: PORTABLE CHEST - 1 VIEW  COMPARISON:  03/16/2015.  FINDINGS: Normal sized heart. The pulmonary vasculature and interstitial markings remain prominent. Small bilateral pleural effusions without significant change. Increased  left lower lobe airspace opacity and mild patchy opacity at the right lung base. Mild thoracic spine degenerative changes.  IMPRESSION: 1. Interval bibasilar airspace opacity. This could represent atelectasis, pneumonia or alveolar edema. 2. Otherwise, stable changes of congestive heart failure.   Electronically Signed   By: Claudie Revering M.D.   On: 03/17/2015 08:22     Medications:     Scheduled Medications: . [START ON 03/19/2015] aspirin EC  81 mg Oral Daily  . atorvastatin  10 mg Oral Daily  . calcium citrate  1 tablet Oral Daily  . carvedilol  3.125 mg Oral BID WC  . cholecalciferol  1,000 Units Oral Daily  . docusate sodium  100 mg Oral BID  . folic acid-pyridoxine-cyancobalamin  1 tablet Oral BID  . furosemide  80 mg Intravenous BID  . glyBURIDE  5 mg Oral BID WC  . hydrALAZINE  12.5 mg Oral 3 times per day   . insulin aspart  0-5 Units Subcutaneous QHS  . insulin aspart  0-9 Units Subcutaneous TID WC  . iron polysaccharides  150 mg Oral BID  . latanoprost  1 drop Both Eyes QHS  . linagliptin  5 mg Oral Daily  . potassium chloride  20 mEq Oral BID  . sodium chloride  10-40 mL Intracatheter Q12H  . sodium chloride  3 mL Intravenous Q12H  . sodium chloride  3 mL Intravenous Q12H  . vitamin C  1,000 mg Oral Daily  . vitamin E  400 Units Oral Daily    Infusions: . heparin 850 Units/hr (03/18/15 0600)  . nitroGLYCERIN 90 mcg/min (03/18/15 0846)    PRN Medications: sodium chloride, sodium chloride, sodium chloride, acetaminophen, ALPRAZolam, nitroGLYCERIN, ondansetron (ZOFRAN) IV, sodium chloride, sodium chloride, zolpidem   Assessment:   1. Acute respiratory failure 2. Acute on chronic systolic HF, EF 06-00% 3. CKD stage 3 4. DM2 5. LBBB 6. HTN 7. Hypokalemia  Plan/Discussion:    Much improved with diuresis and IV NTG. CVP and co-ox now good. I am very worried that she has severe underlying CAD with ischemic CM. Will continue IV lasix, NTG. Supp K+. Plan R/L cath later today. We discussed risks or worsening renal failure.    Length of Stay: 2 [name] MD 03/18/2015, 11:29 AM  Advanced Heart Failure Team Pager [phone] (M-F; [place])  Please contact [facility] for night-coverage after hours (4p -7a ) and weekends on [email or web address]

## 2015-03-18 NOTE — Interval H&P Note (Signed)
History and Physical Interval Note:  03/18/2015 1:05 PM  Katie Fuentes  has presented today for surgery, with the diagnosis of unstable angina  The various methods of treatment have been discussed with the patient and family. After consideration of risks, benefits and other options for treatment, the patient has consented to  Procedure(s): LEFT AND RIGHT HEART CATHETERIZATION WITH CORONARY ANGIOGRAM (N/A) and possible angioplasty as a surgical intervention .  The patient's history has been reviewed, patient examined, no change in status, stable for surgery.  I have reviewed the patient's chart and labs.  Questions were answered to the patient's satisfaction.     [name]

## 2015-03-18 NOTE — Progress Notes (Signed)
Right femoral sheath and right arterial sheath removed per protocol. ACT 153 prior to sheath removal. Pressure held at each site for 20 minutes. Both sites level 0. No bleeding, no bruising, no hematoma present. Patient tolerated sheath removal very well. Patient educated on proper care of site post sheath removal.   Sheath removed with no complications.  Sheath removal assistance provided by Alfonse Flavors, RN.  Bedrest until 2130 03/18/15.  Roxan Hockey, RN

## 2015-03-18 NOTE — CV Procedure (Signed)
     INTRAVASCULAR UTRASOUND REPORT  Katie Fuentes  81 y.o.  female 1934/01/27  Procedure Date: 03/18/2015 Referring Physician: Haroldine Laws, MD Primary Cardiologist: Same  INDICATIONS: Heavily calcified left main with pressure damping on engagement  PROCEDURE: 1. Intravascular ultrasound  CONSENT:  The risks, benefits, and details of the procedure were explained in detail to the patient. Risks including death, stroke, heart attack, kidney injury, allergy, limb ischemia, bleeding and radiation injury were discussed.  The patient verbalized understanding and wanted to proceed.  Informed written consent was obtained.  PROCEDURE TECHNIQUE:  After Xylocaine anesthesia a 5 French sheath was exchanged and for a 6 French sheath in the right femoral artery using the modified Seldinger technique. Double glove technique was used.. Coronary angiography was done using a 6 F JL4 guide catheter. 6500 units of intravenous heparin was administered to achieve an ACT greater than 300 seconds. A Pro-water wire was then advanced into the mid LAD. Intravascular ultrasound was performed on the left main starting just distal to the origin of the circumflex.    CONTRAST:  Total of 50 cc.  RESULTS: Two runs were performed. The tightest area within the left main was 6.1 mm.  IMPRESSION: The left main intravascular ultrasound did not demonstrate a hemodynamically significant region of stenosis with the smallest area being 6.1 mm    RECOMMENDATION:   Further management per Dr. Haroldine Laws.

## 2015-03-19 ENCOUNTER — Encounter (HOSPITAL_COMMUNITY): Payer: Self-pay | Admitting: Internal Medicine

## 2015-03-19 DIAGNOSIS — N189 Chronic kidney disease, unspecified: Secondary | ICD-10-CM

## 2015-03-19 DIAGNOSIS — N179 Acute kidney failure, unspecified: Secondary | ICD-10-CM

## 2015-03-19 DIAGNOSIS — I10 Essential (primary) hypertension: Secondary | ICD-10-CM

## 2015-03-19 LAB — BASIC METABOLIC PANEL
ANION GAP: 13 (ref 5–15)
BUN: 37 mg/dL — ABNORMAL HIGH (ref 6–23)
CALCIUM: 8 mg/dL — AB (ref 8.4–10.5)
CO2: 27 mmol/L (ref 19–32)
Chloride: 90 mmol/L — ABNORMAL LOW (ref 96–112)
Creatinine, Ser: 1.89 mg/dL — ABNORMAL HIGH (ref 0.50–1.10)
GFR calc non Af Amer: 24 mL/min — ABNORMAL LOW (ref >90)
GFR, EST AFRICAN AMERICAN: 28 mL/min — AB (ref >90)
Glucose, Bld: 178 mg/dL — ABNORMAL HIGH (ref 70–99)
POTASSIUM: 3.3 mmol/L — AB (ref 3.5–5.1)
SODIUM: 130 mmol/L — AB (ref 135–145)

## 2015-03-19 LAB — CBC
HEMATOCRIT: 27.4 % — AB (ref 36.0–46.0)
HEMOGLOBIN: 9 g/dL — AB (ref 12.0–15.0)
MCH: 28.2 pg (ref 26.0–34.0)
MCHC: 32.8 g/dL (ref 30.0–36.0)
MCV: 85.9 fL (ref 78.0–100.0)
Platelets: 304 10*3/uL (ref 150–400)
RBC: 3.19 MIL/uL — AB (ref 3.87–5.11)
RDW: 14.8 % (ref 11.5–15.5)
WBC: 10.3 10*3/uL (ref 4.0–10.5)

## 2015-03-19 LAB — GLUCOSE, CAPILLARY
GLUCOSE-CAPILLARY: 186 mg/dL — AB (ref 70–99)
GLUCOSE-CAPILLARY: 237 mg/dL — AB (ref 70–99)
Glucose-Capillary: 145 mg/dL — ABNORMAL HIGH (ref 70–99)
Glucose-Capillary: 219 mg/dL — ABNORMAL HIGH (ref 70–99)

## 2015-03-19 LAB — CARBOXYHEMOGLOBIN
Carboxyhemoglobin: 1.3 % (ref 0.5–1.5)
METHEMOGLOBIN: 1.4 % (ref 0.0–1.5)
O2 Saturation: 88.2 %
Total hemoglobin: 9.2 g/dL — ABNORMAL LOW (ref 12.0–16.0)

## 2015-03-19 MED ORDER — ALUM & MAG HYDROXIDE-SIMETH 200-200-20 MG/5ML PO SUSP
15.0000 mL | ORAL | Status: DC | PRN
  Administered 2015-03-19 (×2): 15 mL via ORAL
  Filled 2015-03-19 (×2): qty 30

## 2015-03-19 MED ORDER — HYDRALAZINE HCL 50 MG PO TABS
50.0000 mg | ORAL_TABLET | Freq: Three times a day (TID) | ORAL | Status: DC
  Administered 2015-03-19 – 2015-03-20 (×4): 50 mg via ORAL
  Filled 2015-03-19 (×6): qty 1

## 2015-03-19 MED ORDER — INSULIN DETEMIR 100 UNIT/ML ~~LOC~~ SOLN
10.0000 [IU] | Freq: Every day | SUBCUTANEOUS | Status: DC
  Administered 2015-03-19 – 2015-03-22 (×4): 10 [IU] via SUBCUTANEOUS
  Filled 2015-03-19 (×4): qty 0.1

## 2015-03-19 MED ORDER — MAGNESIUM HYDROXIDE 400 MG/5ML PO SUSP
30.0000 mL | Freq: Once | ORAL | Status: AC
  Administered 2015-03-19: 30 mL via ORAL
  Filled 2015-03-19: qty 30

## 2015-03-19 MED ORDER — ISOSORBIDE MONONITRATE ER 30 MG PO TB24
30.0000 mg | ORAL_TABLET | Freq: Every day | ORAL | Status: DC
Start: 2015-03-19 — End: 2015-03-21
  Administered 2015-03-19 – 2015-03-20 (×2): 30 mg via ORAL
  Filled 2015-03-19 (×3): qty 1

## 2015-03-19 NOTE — Progress Notes (Signed)
Foley removed at 1030 4/26. Patient offered fluids and encouraged to try to void multiple time during the day.  Patient did not void post 6 hours of foley being removed. Bladder scanned patient, bladder scan showed 108 mL.   Next bladder scan due at 9pm if patient does not void.  Roxan Hockey, RN

## 2015-03-19 NOTE — Progress Notes (Signed)
Inpatient Diabetes Program Recommendations  AACE/ADA: New Consensus Statement on Inpatient Glycemic Control (2013)  Target Ranges:  Prepandial:   less than 140 mg/dL      Peak postprandial:   less than 180 mg/dL (1-2 hours)      Critically ill patients:  140 - 180 mg/dL   Inpatient Diabetes Program Recommendations Insulin - Basal: add Levemir 10 units  Oral Agents: Discontinue Glyburide  Thank you  Raoul Pitch BSN, RN,CDE Inpatient Diabetes Coordinator [phone] (team pager)

## 2015-03-19 NOTE — Care Management Note (Addendum)
    Page 1 of 2   03/25/2015     11:18:54 AM CARE MANAGEMENT NOTE 03/25/2015  Patient:  Katie Fuentes, Katie Fuentes   Account Number:  [id number]  Date Initiated:  03/18/2015  Documentation initiated by:  Elissa Hefty  Subjective/Objective Assessment:   adm w htn urgency,resp failure     Action/Plan:   lives w husband, pcp dr Delfino Lovett pang   Anticipated DC Date:     Anticipated DC Plan:  [place]         [facility] Choice  Resumption Of Svcs/PTA Provider   Choice offered to / List presented to:     DME arranged  [place]      DME agency  [place] arranged  HH-1 RN  [place].   Status of service:   Medicare Important Message given?  YES (If response is "NO", the following Medicare IM given date fields will be blank) Date Medicare IM given:  03/25/2015 Medicare IM given by:  Elissa Hefty Date Additional Medicare IM given:  03/22/2015 Additional Medicare IM given by:  Elissa Hefty  Discharge Disposition:  [place]  Per UR Regulation:  Reviewed for med. necessity/level of care/duration of stay  If discussed at [place] of Stay Meetings, dates discussed:   03/21/2015    Comments:  5/2 1116a debbie Jonan Seufert rn,bsn spoke w pt and husband, they will stay w [facility]. they loved phy ther but req different nse. nse they had saw pt of adm and about 3 hrs after she left they had to come to hosp. they would just feel more comf w new nse. have spoken w [facility] rep and req different nse. have ordered bsc and rolator-rolling walker w wheels that phy ther had req. will cont to follow.

## 2015-03-19 NOTE — Progress Notes (Signed)
Advanced Heart Failure Rounding Note   Subjective:    Admitted 4/23 with recurrent HF. Had several episodes of flush pulmonary edema in setting of severe HTN.  Started on NTG drip which was titrated to 40mcg/min  Underwent R/L cath on 4/25 with showed 60% LM (confirmed by IVUS) and 95% L renal artery stenosis. (R ok)  RA = 2 RV = 35/1/3 PA = 31/11 (21) PCW = 12 Fick cardiac output/index = 3.5/2.8 PVR = 2.5 WU SVR = 2121  FA sat = 94% PA sat = 55%, 57%  Denies CP or SOB. Cr up 1.5-> 1.89. Weight down 4 pounds. SBP remains 150-170 on 85 mcg/min nitroglycerin. Feels good. No CP or dyspnea. CVP 2-3  Objective:   Weight Range:  Vital Signs:   Temp:  [97.7 F (36.5 C)-99.3 F (37.4 C)] 98.6 F (37 C) (04/26 0733) Pulse Rate:  [96-126] 109 (04/26 0900) Resp:  [12-29] 21 (04/26 0900) BP: (146-176)/(64-94) 151/64 mmHg (04/26 0900) SpO2:  [93 %-99 %] 97 % (04/26 0900) Weight:  [70 kg (154 lb 5.2 oz)] 70 kg (154 lb 5.2 oz) (04/26 0500)    Weight change: Filed Weights   03/16/15 1130 03/17/15 0500 03/19/15 0500  Weight: 71.9 kg (158 lb 8.2 oz) 72 kg (158 lb 11.7 oz) 70 kg (154 lb 5.2 oz)    Intake/Output:   Intake/Output Summary (Last 24 hours) at 03/19/15 1021 Last data filed at 03/19/15 0800  Gross per 24 hour  Intake 1381.75 ml  Output   2000 ml  Net -618.25 ml     Physical Exam: General:  Elderly No resp difficulty. Sitting HEENT: normal Neck: supple. JVP flat. Carotids 2+ bilat; no bruits. No lymphadenopathy or thryomegaly appreciated. Cor: PMI nondisplaced. Regular rate & rhythm. No rubs, gallops or murmurs. Lungs: clear Abdomen: soft, nontender, nondistended. No hepatosplenomegaly. No bruits or masses. Good bowel sounds. Extremities: no cyanosis, clubbing, rash, edema Neuro: alert & orientedx3, cranial nerves grossly intact. moves all 4 extremities w/o difficulty. Affect pleasant  Telemetry: Sinus tach 100  Labs: Basic Metabolic Panel:  Recent Labs Lab  03/16/15 0840  03/17/15 0243 03/17/15 1600 03/18/15 0530 03/18/15 1400 03/18/15 1530 03/19/15 0520  NA  --   < > 135 133* 132* 131*  --  130*  K  --   < > 2.8* 3.5 3.1* 3.4*  --  3.3*  CL  --   < > 97 93* 91* 91*  --  90*  CO2  --   --  27 28 29 29   --  27  GLUCOSE  --   < > 131* 263* 192* 220*  --  178*  BUN  --   < > 34* 34* 33* 29*  --  37*  CREATININE  --   < > 1.53* 1.62* 1.54* 1.42* 1.49* 1.89*  CALCIUM  --   --  8.3* 8.3* 8.1* 8.1*  --  8.0*  MG 2.1  --   --   --   --   --   --   --   < > = values in this interval not displayed.  Liver Function Tests: No results for input(s): AST, ALT, ALKPHOS, BILITOT, PROT, ALBUMIN in the last 168 hours. No results for input(s): LIPASE, AMYLASE in the last 168 hours. No results for input(s): AMMONIA in the last 168 hours.  CBC:  Recent Labs Lab 03/16/15 0650 03/16/15 0925 03/18/15 0530 03/18/15 1530 03/19/15 0520  WBC 10.7*  --  10.9* 9.5 10.3  NEUTROABS 8.9*  --   --   --   --   HGB 10.4* 11.6* 9.1* 9.1* 9.0*  HCT 31.9* 34.0* 27.9* 27.7* 27.4*  MCV 86.4  --  86.6 85.8 85.9  PLT 318  --  319 319 304    Cardiac Enzymes:  Recent Labs Lab 03/17/15 1600 03/17/15 2001  CKTOTAL 92  --   CKMB 4.0  --   TROPONINI 0.12* 0.12*    BNP: BNP (last 3 results)  Recent Labs  02/14/15 0247 03/16/15 0650  BNP 1346.9* 648.2*    ProBNP (last 3 results)  Recent Labs  03/01/15 0840  PROBNP 742.0*      Other results:  Imaging: No results found.   Medications:     Scheduled Medications: . aspirin EC  81 mg Oral Daily  . atorvastatin  10 mg Oral Daily  . calcium citrate  1 tablet Oral Daily  . carvedilol  3.125 mg Oral BID WC  . cholecalciferol  1,000 Units Oral Daily  . docusate sodium  100 mg Oral BID  . folic acid-pyridoxine-cyancobalamin  1 tablet Oral BID  . glyBURIDE  5 mg Oral BID WC  . heparin  5,000 Units Subcutaneous 3 times per day  . hydrALAZINE  25 mg Oral 3 times per day  . insulin aspart  0-5  Units Subcutaneous QHS  . insulin aspart  0-9 Units Subcutaneous TID WC  . iron polysaccharides  150 mg Oral BID  . latanoprost  1 drop Both Eyes QHS  . linagliptin  5 mg Oral Daily  . sodium chloride  10-40 mL Intracatheter Q12H  . sodium chloride  3 mL Intravenous Q12H  . vitamin C  1,000 mg Oral Daily  . vitamin E  400 Units Oral Daily    Infusions: . nitroGLYCERIN 85 mcg/min (03/19/15 0820)    PRN Medications: sodium chloride, acetaminophen, ALPRAZolam, alum & mag hydroxide-simeth, nitroGLYCERIN, ondansetron (ZOFRAN) IV, sodium chloride, sodium chloride, zolpidem   Assessment:   1. Acute respiratory failure due flash pulmonary edema 2. Acute on chronic systolic HF, EF 64-40% 3. CKD stage 3 4. DM2 5. LBBB 6. HTN 7. Hypokalemia 8. Acute on chronic renal failure, now stage 4 9. CAD with 60% LM lesion 10. Unilateral L renal artery stenosis   Plan/Discussion:    Clinically improved. CVP low. Cardiac output ok. Unfortunately renal function worse and I am concerned about contrast nephropathy. Will hold all diuretics. Encouraged po intake.   SBP remains quite high. Will titrate hydralazine and Imdur. Wean NTG as tolerated. Keep SBP > 130 for renal perfusion.  Given underlying CKD and severe HTN with episodes of flash pulmonary I think she needs stent of L renal artery. I have reviewed with Dr. Trula Slade who agrees. Will plan for Friday if renal function improving by then.   No ACE or ARB with renal failure. Keep b-blocker at low dose.   Benay Spice 10:48 AM  Length of Stay: 3 [name] MD 03/19/2015, 10:21 AM Advanced Heart Failure Team Pager [phone] (M-F; 7a - 4p)  Please contact [facility] for night-coverage after hours (4p -7a ) and weekends on [email or web address]

## 2015-03-20 LAB — CBC
HCT: 26.3 % — ABNORMAL LOW (ref 36.0–46.0)
Hemoglobin: 8.6 g/dL — ABNORMAL LOW (ref 12.0–15.0)
MCH: 27.7 pg (ref 26.0–34.0)
MCHC: 32.7 g/dL (ref 30.0–36.0)
MCV: 84.8 fL (ref 78.0–100.0)
PLATELETS: 307 10*3/uL (ref 150–400)
RBC: 3.1 MIL/uL — ABNORMAL LOW (ref 3.87–5.11)
RDW: 14.8 % (ref 11.5–15.5)
WBC: 10.7 10*3/uL — ABNORMAL HIGH (ref 4.0–10.5)

## 2015-03-20 LAB — GLUCOSE, CAPILLARY
GLUCOSE-CAPILLARY: 247 mg/dL — AB (ref 70–99)
GLUCOSE-CAPILLARY: 175 mg/dL — AB (ref 70–99)
Glucose-Capillary: 222 mg/dL — ABNORMAL HIGH (ref 70–99)
Glucose-Capillary: 215 mg/dL — ABNORMAL HIGH (ref 70–99)

## 2015-03-20 LAB — BASIC METABOLIC PANEL
Anion gap: 13 (ref 5–15)
BUN: 51 mg/dL — ABNORMAL HIGH (ref 6–23)
CALCIUM: 8 mg/dL — AB (ref 8.4–10.5)
CO2: 25 mmol/L (ref 19–32)
Chloride: 86 mmol/L — ABNORMAL LOW (ref 96–112)
Creatinine, Ser: 3.11 mg/dL — ABNORMAL HIGH (ref 0.50–1.10)
GFR calc Af Amer: 15 mL/min — ABNORMAL LOW (ref >90)
GFR calc non Af Amer: 13 mL/min — ABNORMAL LOW (ref >90)
GLUCOSE: 159 mg/dL — AB (ref 70–99)
Potassium: 4 mmol/L (ref 3.5–5.1)
Sodium: 124 mmol/L — ABNORMAL LOW (ref 135–145)

## 2015-03-20 LAB — CARBOXYHEMOGLOBIN
CARBOXYHEMOGLOBIN: 1.1 % (ref 0.5–1.5)
METHEMOGLOBIN: 1.1 % (ref 0.0–1.5)
O2 SAT: 80.3 %
Total hemoglobin: 8.5 g/dL — ABNORMAL LOW (ref 12.0–16.0)

## 2015-03-20 MED ORDER — CETYLPYRIDINIUM CHLORIDE 0.05 % MT LIQD
7.0000 mL | Freq: Two times a day (BID) | OROMUCOSAL | Status: DC
  Administered 2015-03-21 – 2015-03-29 (×18): 7 mL via OROMUCOSAL

## 2015-03-20 MED ORDER — HYDRALAZINE HCL 20 MG/ML IJ SOLN
10.0000 mg | INTRAMUSCULAR | Status: DC | PRN
  Administered 2015-03-20: 10 mg via INTRAVENOUS
  Filled 2015-03-20: qty 1

## 2015-03-20 MED ORDER — FUROSEMIDE 10 MG/ML IJ SOLN
INTRAMUSCULAR | Status: AC
  Filled 2015-03-20: qty 8

## 2015-03-20 MED ORDER — FUROSEMIDE 10 MG/ML IJ SOLN
80.0000 mg | Freq: Once | INTRAMUSCULAR | Status: AC
Start: 2015-03-20 — End: 2015-03-20
  Administered 2015-03-20: 80 mg via INTRAVENOUS

## 2015-03-20 MED ORDER — FUROSEMIDE 10 MG/ML IJ SOLN
80.0000 mg | Freq: Once | INTRAMUSCULAR | Status: AC
  Administered 2015-03-20: 80 mg via INTRAVENOUS
  Filled 2015-03-20: qty 8

## 2015-03-20 MED ORDER — FUROSEMIDE 10 MG/ML IJ SOLN
120.0000 mg | Freq: Two times a day (BID) | INTRAVENOUS | Status: DC
  Administered 2015-03-20 – 2015-03-22 (×4): 120 mg via INTRAVENOUS
  Filled 2015-03-20 (×5): qty 12

## 2015-03-20 MED ORDER — HYDRALAZINE HCL 50 MG PO TABS
100.0000 mg | ORAL_TABLET | Freq: Three times a day (TID) | ORAL | Status: DC
  Administered 2015-03-20 – 2015-03-28 (×21): 100 mg via ORAL
  Filled 2015-03-20 (×33): qty 2

## 2015-03-20 MED ORDER — CHLORHEXIDINE GLUCONATE 0.12 % MT SOLN
15.0000 mL | Freq: Two times a day (BID) | OROMUCOSAL | Status: DC
  Administered 2015-03-20 – 2015-03-28 (×13): 15 mL via OROMUCOSAL
  Filled 2015-03-20 (×9): qty 15

## 2015-03-20 NOTE — Progress Notes (Addendum)
MD paged regarding pt's continued htn.  Orders received for lasix and prn hydralazine. Ordered to try and titrate IV Nitro down.  Ordered to give lasix if CVP 9 or greater. CVP 6 at this time. Will hold 1800 dose.  MD ordered for night RN to recheck tonight and given 1800 lasix dose if CVP 9 or greater.  Will continue to monitor closely and update as needed. Emotional support given to pt & family.

## 2015-03-20 NOTE — Progress Notes (Signed)
Pt. Experiencing some respiratory distress. MD notified and orders placed. Pt was also placed on Bipap with relief.

## 2015-03-20 NOTE — Progress Notes (Signed)
S: Patient admitted with acute respiratory failure.  Called O/N for acute respiratory distress with hypoxia.  Has had episodes of flash pulmonary edema due to severe HTN.  Patient with crackles on exam.  P: 1. Placed on BiPap 2. IV Lasix x 1 3. Titrate Nitro   Patient much improved after initial interventions.  Today's Vitals   03/20/15 0300 03/20/15 0310 03/20/15 0321 03/20/15 0400  BP: 160/75 160/75 160/75 153/74  Pulse: 100 96 95 94  Temp:  97.8 F (36.6 C)    TempSrc:  Oral    Resp: 15 15 16 18   Height:      Weight:  72.4 kg (159 lb 9.8 oz)    SpO2: 99% 100% 100% 99%  PainSc:

## 2015-03-20 NOTE — Progress Notes (Addendum)
Advanced Heart Failure Rounding Note   Subjective:    Admitted 4/23 with recurrent HF. Had several episodes of flush pulmonary edema in setting of severe HTN.  Started on NTG drip which was titrated to 57mcg/min  Underwent R/L cath on 4/25 with showed 60% LM (confirmed by IVUS) and 95% L renal artery stenosis. (R ok)  RA = 2 RV = 35/1/3 PA = 31/11 (21) PCW = 12 Fick cardiac output/index = 3.5/2.8 PVR = 2.5 WU SVR = 2121  FA sat = 94% PA sat = 55%, 57%  Had episode of flash pulmonary edema last night. Received IV lasix and treated with BIPAP.   Objective:   Weight Range:  Vital Signs:   Temp:  [97.4 F (36.3 C)-98.4 F (36.9 C)] 97.4 F (36.3 C) (04/27 0738) Pulse Rate:  [94-111] 94 (04/27 1000) Resp:  [14-36] 17 (04/27 1000) BP: (139-173)/(58-91) 140/67 mmHg (04/27 1000) SpO2:  [93 %-100 %] 99 % (04/27 1000) FiO2 (%):  [40 %-50 %] 40 % (04/27 0800) Weight:  [72.4 kg (159 lb 9.8 oz)] 72.4 kg (159 lb 9.8 oz) (04/27 0310) Last BM Date: 03/19/15  Weight change: Filed Weights   03/17/15 0500 03/19/15 0500 03/20/15 0310  Weight: 72 kg (158 lb 11.7 oz) 70 kg (154 lb 5.2 oz) 72.4 kg (159 lb 9.8 oz)    Intake/Output:   Intake/Output Summary (Last 24 hours) at 03/20/15 1033 Last data filed at 03/20/15 1000  Gross per 24 hour  Intake 962.63 ml  Output      0 ml  Net 962.63 ml     Physical Exam: General:  ElderlySitting in chair with bipap HEENT: normal Neck: supple. JVP 10 Carotids 2+ bilat; no bruits. No lymphadenopathy or thryomegaly appreciated. Cor: PMI nondisplaced. Regular rate & rhythm. No rubs, gallops or murmurs. Lungs: basilar crackles Abdomen: soft, nontender, nondistended. No hepatosplenomegaly. No bruits or masses. Good bowel sounds. Extremities: no cyanosis, clubbing, rash, trace edema Neuro: alert & orientedx3, cranial nerves grossly intact. moves all 4 extremities w/o difficulty. Affect pleasant  Telemetry: Sinus 90-100  Labs: Basic Metabolic  Panel:  Recent Labs Lab 03/16/15 0840  03/17/15 1600 03/18/15 0530 03/18/15 1400 03/18/15 1530 03/19/15 0520 03/20/15 0455  NA  --   < > 133* 132* 131*  --  130* 124*  K  --   < > 3.5 3.1* 3.4*  --  3.3* 4.0  CL  --   < > 93* 91* 91*  --  90* 86*  CO2  --   < > 28 29 29   --  27 25  GLUCOSE  --   < > 263* 192* 220*  --  178* 159*  BUN  --   < > 34* 33* 29*  --  37* 51*  CREATININE  --   < > 1.62* 1.54* 1.42* 1.49* 1.89* 3.11*  CALCIUM  --   < > 8.3* 8.1* 8.1*  --  8.0* 8.0*  MG 2.1  --   --   --   --   --   --   --   < > = values in this interval not displayed.  Liver Function Tests: No results for input(s): AST, ALT, ALKPHOS, BILITOT, PROT, ALBUMIN in the last 168 hours. No results for input(s): LIPASE, AMYLASE in the last 168 hours. No results for input(s): AMMONIA in the last 168 hours.  CBC:  Recent Labs Lab 03/16/15 0650 03/16/15 0925 03/18/15 0530 03/18/15 1530 03/19/15 0520 03/20/15 0455  WBC  10.7*  --  10.9* 9.5 10.3 10.7*  NEUTROABS 8.9*  --   --   --   --   --   HGB 10.4* 11.6* 9.1* 9.1* 9.0* 8.6*  HCT 31.9* 34.0* 27.9* 27.7* 27.4* 26.3*  MCV 86.4  --  86.6 85.8 85.9 84.8  PLT 318  --  319 319 304 307    Cardiac Enzymes:  Recent Labs Lab 03/17/15 1600 03/17/15 2001  CKTOTAL 92  --   CKMB 4.0  --   TROPONINI 0.12* 0.12*    BNP: BNP (last 3 results)  Recent Labs  02/14/15 0247 03/16/15 0650  BNP 1346.9* 648.2*    ProBNP (last 3 results)  Recent Labs  03/01/15 0840  PROBNP 742.0*      Other results:  Imaging: No results found.   Medications:     Scheduled Medications: . aspirin EC  81 mg Oral Daily  . atorvastatin  10 mg Oral Daily  . calcium citrate  1 tablet Oral Daily  . carvedilol  3.125 mg Oral BID WC  . cholecalciferol  1,000 Units Oral Daily  . docusate sodium  100 mg Oral BID  . folic acid-pyridoxine-cyancobalamin  1 tablet Oral BID  . heparin  5,000 Units Subcutaneous 3 times per day  . hydrALAZINE  50 mg  Oral 3 times per day  . insulin aspart  0-5 Units Subcutaneous QHS  . insulin aspart  0-9 Units Subcutaneous TID WC  . insulin detemir  10 Units Subcutaneous Daily  . iron polysaccharides  150 mg Oral BID  . isosorbide mononitrate  30 mg Oral Daily  . latanoprost  1 drop Both Eyes QHS  . linagliptin  5 mg Oral Daily  . sodium chloride  10-40 mL Intracatheter Q12H  . sodium chloride  3 mL Intravenous Q12H  . vitamin C  1,000 mg Oral Daily  . vitamin E  400 Units Oral Daily    Infusions: . nitroGLYCERIN 70 mcg/min (03/20/15 0841)    PRN Medications: sodium chloride, acetaminophen, ALPRAZolam, alum & mag hydroxide-simeth, nitroGLYCERIN, ondansetron (ZOFRAN) IV, sodium chloride, sodium chloride, zolpidem   Assessment:   1. Acute respiratory failure due flash pulmonary edema 2. Acute on chronic systolic HF, EF 76-72% 3. CKD stage 3 4. DM2 5. LBBB 6. HTN 7. Hypokalemia 8. Acute on chronic renal failure, now stage 4 9. CAD with 60% LM lesion 10. Unilateral L renal artery stenosis   Plan/Discussion:    Continues to struggle with severe HTN and flash pulmonary edema. Continue BIPAP and IV lasix.   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 Will consult Renal.   Continue to wean IV NTG. Titrate hydralazine.Marland Kitchen Keep SBP > 130 for renal perfusion.  Given underlying CKD and severe HTN with episodes of flash pulmonary I think she needs stent of L renal artery. I have reviewed with Dr. Trula Slade who agrees. Given progressive renal dysfunction will likely have to wait until next week.   HGb dropping slowly. Will continue to follow.   No ACE or ARB with renal failure. Keep b-blocker at low dose.    Length of Stay: 4 [name] MD 03/20/2015, 10:33 AM Advanced Heart Failure Team Pager [phone] (M-F; [place])  Please contact [facility] for night-coverage after hours (4p -7a ) and weekends on [email or web address]

## 2015-03-20 NOTE — Consult Note (Signed)
Katie Fuentes Admit Date: 03/16/2015 03/20/2015 Rexene Agent Requesting Physician:  Bensimhon  Reason for Consult:  AKI HPI:  81 year old female with history of type 2 diabetes, hypertension, hyperlipidemia, admitted on 03/16/15 with dyspnea and pulmonary edema. She has a history of systolic heart failure previous admissions for pulmonary edema and exacerbations. She underwent left and right heart catheterizations on 03/18/15 and also had a renal angiogram performed. There was a heavy contrast load. She had a 95% ostial left renal artery stenosis. She has chronic kidney disease with a baseline serum creatinine of 1.41.6. It has subsequently increased to 3.1 today. She has become oliguric. Overnight she developed worsened dyspnea requiring replacement of BiPAP and nitroglycerin drip. She has had minimal response to IV diuretics to this point. Previous renal ultrasound demonstrated normal size kidneys with slightly increased echogenicity, no major structural abnormalities. Home medications with no nephrotoxins. Other than contrast and no other obvious nephrotoxins.  Currently she feels well. She has a tight. She is off BiPAP and feels like her breathing has stabilized. The nitroglycerin drip.   CREATININE, SER (mg/dL)  Date Value  03/20/2015 3.11*  03/19/2015 1.89*  03/18/2015 1.49*  03/18/2015 1.42*  03/18/2015 1.54*  03/17/2015 1.62*  03/17/2015 1.53*  03/16/2015 1.50*  03/16/2015 1.60*  03/01/2015 1.57*  ] I/Os: I/O last 3 completed shifts: In: 1745.6 [P.O.:740; I.V.:1005.6] Out: 800 [Urine:800]  ROS Balance of 12 systems is negative w/ exceptions as above  PMH  Past Medical History  Diagnosis Date  . Atrophic vaginitis   . Yeast infection   . Glaucoma   . Cataracts, bilateral   . Type 2 diabetes, controlled, with renal manifestation 01/2000  . Hyperlipidemia   . Hypertension   . Congestive heart failure   . Cardiomyopathy   . LBBB (left bundle branch block)   .  Chronic renal disease, stage III    PSH  Past Surgical History  Procedure Laterality Date  . Vaginal delivery      x3  . Colonoscopy  05/2003  . Finger surgery    . Cataract extraction w/ intraocular lens implant Right 2011    1 year later the left side done  . Colonoscopy  06/2008    Dr. Lajoyce Corners recheck in 5 years  . Total vaginal hysterectomy  1978    for endometriosis  . Appendectomy  1950  . Squamous cell carcinoma excision  6/15    Nose  . Left and right heart catheterization with coronary angiogram N/A 03/18/2015    Procedure: LEFT AND RIGHT HEART CATHETERIZATION WITH CORONARY ANGIOGRAM;  Surgeon: Jolaine Artist, MD;  Location: [facility] CATH LAB;  Service: Cardiovascular;  Laterality: N/A;   FH  Family History  Problem Relation Age of Onset  . Heart disease  67  . CAD Brother   . Hypertension Brother    [place]  reports that she has quit smoking. Her smoking use included Cigarettes. She has a .2 pack-year smoking history. She has never used smokeless tobacco. She reports that she does not drink alcohol or use illicit drugs. Allergies  Allergies  Allergen Reactions  . Cefdinir Diarrhea  . Macrobid [Nitrofurantoin Macrocrystal] Nausea And Vomiting and Other (See Comments)    Fever, Chills  . Sulfa Antibiotics Nausea And Vomiting and Other (See Comments)    Fever,chills  . Alphagan [Brimonidine] Other (See Comments)    Redness in the eyes  . Azopt [Brinzolamide]     Redness in the eyes   Home medications Prior to Admission  medications   Medication Sig Start Date End Date Taking? Authorizing Provider  amLODipine (NORVASC) 2.5 MG tablet Take 1 tablet (2.5 mg total) by mouth daily. 03/01/15  Yes Burtis Junes, NP  Ascorbic Acid (VITAMIN C) 1000 MG tablet Take 1,000 mg by mouth daily.   Yes Historical Provider, MD  aspirin 81 MG tablet Take 81 mg by mouth at bedtime.    Yes Historical Provider, MD  atorvastatin (LIPITOR) 10 MG tablet Take 10 mg by mouth daily.   Yes Historical  Provider, MD  beta carotene 10000 UNIT capsule Take 10,000 Units by mouth daily.   Yes Historical Provider, MD  calcium citrate (CALCITRATE - DOSED IN MG ELEMENTAL CALCIUM) 950 MG tablet Take 1 tablet by mouth 2 (two) times daily.    Yes Historical Provider, MD  carvedilol (COREG) 12.5 MG tablet Take 1.5 tablets (18.75 mg total) by mouth 2 (two) times daily with a meal. 03/01/15  Yes Burtis Junes, NP  cholecalciferol (VITAMIN D) 1000 UNITS tablet Take 1,000 Units by mouth daily.   Yes Historical Provider, MD  docusate sodium (COLACE) 100 MG capsule Take 1 capsule (100 mg total) by mouth 2 (two) times daily. 02/18/15  Yes Bonnielee Haff, MD  furosemide (LASIX) 20 MG tablet Take 1 tablet (20 mg total) by mouth daily. 03/07/15  Yes Liliane Shi, PA-C  glucosamine-chondroitin 500-400 MG tablet Take 1 tablet by mouth daily.    Yes Historical Provider, MD  glyBURIDE (DIABETA) 5 MG tablet Take 5 mg by mouth 2 (two) times daily with a meal.    Yes Historical Provider, MD  iron polysaccharides (NIFEREX) 150 MG capsule Take 1 capsule (150 mg total) by mouth 2 (two) times daily. 02/18/15  Yes Bonnielee Haff, MD  JANUMET 50-500 MG per tablet Take by mouth 2 (two) times daily. 01/13/15  Yes Historical Provider, MD  L-Methylfolate-B6-B12 (METANX PO) Take 1 tablet by mouth daily.    Yes Historical Provider, MD  Probiotic Product (PROBIOTIC DAILY PO) Take 1 tablet by mouth at bedtime. Florastor   Yes Historical Provider, MD  sitaGLIPtin (JANUVIA) 50 MG tablet Take 1 tablet (50 mg total) by mouth daily. Patient taking differently: Take 50 mg by mouth 2 (two) times daily.  02/18/15  Yes Bonnielee Haff, MD  Travoprost, BAK Free, (TRAVATAN) 0.004 % SOLN ophthalmic solution Place 1 drop into both eyes at bedtime.   Yes Historical Provider, MD  vitamin E 400 UNIT capsule Take 400 Units by mouth daily.   Yes Historical Provider, MD  ACCU-CHEK AVIVA PLUS test strip  04/27/13   Historical Provider, MD  conjugated estrogens  (PREMARIN) vaginal cream Place vaginally 2 (two) times a week. Patient not taking: Reported on 03/16/2015 05/24/14   [name], FNP  Lancets ([place]) lancets  02/26/15   Historical Provider, MD    Current Medications Scheduled Meds: . antiseptic oral rinse  7 mL Mouth Rinse q12n4p  . aspirin EC  81 mg Oral Daily  . atorvastatin  10 mg Oral Daily  . calcium citrate  1 tablet Oral Daily  . carvedilol  3.125 mg Oral BID WC  . chlorhexidine  15 mL Mouth Rinse BID  . cholecalciferol  1,000 Units Oral Daily  . docusate sodium  100 mg Oral BID  . folic acid-pyridoxine-cyancobalamin  1 tablet Oral BID  . heparin  5,000 Units Subcutaneous 3 times per day  . hydrALAZINE  50 mg Oral 3 times per day  . insulin aspart  0-5  Units Subcutaneous QHS  . insulin aspart  0-9 Units Subcutaneous TID WC  . insulin detemir  10 Units Subcutaneous Daily  . iron polysaccharides  150 mg Oral BID  . isosorbide mononitrate  30 mg Oral Daily  . latanoprost  1 drop Both Eyes QHS  . linagliptin  5 mg Oral Daily  . sodium chloride  10-40 mL Intracatheter Q12H  . sodium chloride  3 mL Intravenous Q12H  . vitamin C  1,000 mg Oral Daily  . vitamin E  400 Units Oral Daily   Continuous Infusions: . nitroGLYCERIN 70 mcg/min (03/20/15 0841)   PRN Meds:.sodium chloride, acetaminophen, ALPRAZolam, alum & mag hydroxide-simeth, nitroGLYCERIN, ondansetron (ZOFRAN) IV, sodium chloride, sodium chloride, zolpidem  CBC  Recent Labs Lab 03/16/15 0650  03/18/15 1530 03/19/15 0520 03/20/15 0455  WBC 10.7*  < > 9.5 10.3 10.7*  NEUTROABS 8.9*  --   --   --   --   HGB 10.4*  < > 9.1* 9.0* 8.6*  HCT 31.9*  < > 27.7* 27.4* 26.3*  MCV 86.4  < > 85.8 85.9 84.8  PLT 318  < > 319 304 307  < > = values in this interval not displayed. Basic Metabolic Panel  Recent Labs Lab 03/16/15 0650 03/16/15 0925 03/17/15 0243 03/17/15 1600 03/18/15 0530 03/18/15 1400 03/18/15 1530 03/19/15 0520 03/20/15 0455  NA  130* 132* 135 133* 132* 131*  --  130* 124*  K 3.9 3.0* 2.8* 3.5 3.1* 3.4*  --  3.3* 4.0  CL 92* 93* 97 93* 91* 91*  --  90* 86*  CO2 24  --  27 28 29 29   --  27 25  GLUCOSE 299* 309* 131* 263* 192* 220*  --  178* 159*  BUN 43* 40* 34* 34* 33* 29*  --  37* 51*  CREATININE 1.60* 1.50* 1.53* 1.62* 1.54* 1.42* 1.49* 1.89* 3.11*  CALCIUM 8.5  --  8.3* 8.3* 8.1* 8.1*  --  8.0* 8.0*    Physical Exam  Blood pressure 153/75, pulse 95, temperature 97.4 F (36.3 C), temperature source Axillary, resp. rate 21, height 5\' 2"  (1.575 m), weight 72.4 kg (159 lb 9.8 oz), last menstrual period 07/24/1977, SpO2 99 %. 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 BL SCr 1.4-1.6, suspect multifactorial etiology including DM/HTN/ASCVD/RAS 2. Minimal UOP and brisk inc in SCr over past 24h 3. Renal US 01/2015 w/o structural issues 4. Very well could need HD within next several days 5. No HD catheter today Daily weights, Daily Renal Panel, Strict I/Os, Avoid nephrotoxins (NSAIDs, judicious IV Contrast) 1. L RAS secondary to atherosclerotic disease 1. 95% ostial on 03/18/15 angiogram 2. Agree with eventual stent placement but patient is currently not stable 2. Acute on chronic systolic heart failure: Managed by AHF 3. Anemia  Pearson Grippe MD [phone] pgr 03/20/2015, 1:17 PM

## 2015-03-21 LAB — BASIC METABOLIC PANEL
ANION GAP: 14 (ref 5–15)
BUN: 51 mg/dL — ABNORMAL HIGH (ref 6–23)
CALCIUM: 8.4 mg/dL (ref 8.4–10.5)
CHLORIDE: 83 mmol/L — AB (ref 96–112)
CO2: 25 mmol/L (ref 19–32)
Creatinine, Ser: 3.2 mg/dL — ABNORMAL HIGH (ref 0.50–1.10)
GFR calc Af Amer: 15 mL/min — ABNORMAL LOW (ref >90)
GFR calc non Af Amer: 13 mL/min — ABNORMAL LOW (ref >90)
Glucose, Bld: 194 mg/dL — ABNORMAL HIGH (ref 70–99)
POTASSIUM: 3.5 mmol/L (ref 3.5–5.1)
Sodium: 122 mmol/L — ABNORMAL LOW (ref 135–145)

## 2015-03-21 LAB — CARBOXYHEMOGLOBIN
Carboxyhemoglobin: 0.9 % (ref 0.5–1.5)
Methemoglobin: 1.2 % (ref 0.0–1.5)
O2 Saturation: 76.2 %
TOTAL HEMOGLOBIN: 13 g/dL (ref 12.0–16.0)

## 2015-03-21 LAB — CBC
HCT: 26.2 % — ABNORMAL LOW (ref 36.0–46.0)
Hemoglobin: 8.8 g/dL — ABNORMAL LOW (ref 12.0–15.0)
MCH: 28.1 pg (ref 26.0–34.0)
MCHC: 33.6 g/dL (ref 30.0–36.0)
MCV: 83.7 fL (ref 78.0–100.0)
Platelets: 347 10*3/uL (ref 150–400)
RBC: 3.13 MIL/uL — AB (ref 3.87–5.11)
RDW: 14.8 % (ref 11.5–15.5)
WBC: 11 10*3/uL — AB (ref 4.0–10.5)

## 2015-03-21 LAB — GLUCOSE, CAPILLARY
GLUCOSE-CAPILLARY: 132 mg/dL — AB (ref 70–99)
Glucose-Capillary: 227 mg/dL — ABNORMAL HIGH (ref 70–99)
Glucose-Capillary: 263 mg/dL — ABNORMAL HIGH (ref 70–99)
Glucose-Capillary: 336 mg/dL — ABNORMAL HIGH (ref 70–99)

## 2015-03-21 LAB — TYPE AND SCREEN
ABO/RH(D): O POS
Antibody Screen: NEGATIVE

## 2015-03-21 LAB — ABO/RH: ABO/RH(D): O POS

## 2015-03-21 LAB — OSMOLALITY, URINE: OSMOLALITY UR: 312 mosm/kg — AB (ref 390–1090)

## 2015-03-21 LAB — OSMOLALITY: Osmolality: 283 mOsm/kg (ref 275–300)

## 2015-03-21 MED ORDER — AMLODIPINE BESYLATE 5 MG PO TABS
5.0000 mg | ORAL_TABLET | Freq: Every day | ORAL | Status: DC
  Administered 2015-03-21 – 2015-03-22 (×2): 5 mg via ORAL
  Filled 2015-03-21 (×3): qty 1

## 2015-03-21 MED ORDER — FUROSEMIDE 10 MG/ML IJ SOLN
120.0000 mg | Freq: Once | INTRAVENOUS | Status: AC
  Administered 2015-03-21: 120 mg via INTRAVENOUS
  Filled 2015-03-21: qty 12

## 2015-03-21 NOTE — Progress Notes (Signed)
Inpatient Diabetes Program Recommendations  AACE/ADA: New Consensus Statement on Inpatient Glycemic Control (2013)  Target Ranges:  Prepandial:   less than 140 mg/dL      Peak postprandial:   less than 180 mg/dL (1-2 hours)      Critically ill patients:  140 - 180 mg/dL  Results for ANOUK, CRITZER (MRN [id number]) as of 03/21/2015 10:02  Ref. Range 03/20/2015 08:27 03/20/2015 12:05 03/20/2015 16:42 03/20/2015 22:02 03/21/2015 07:37  Glucose-Capillary Latest Ref Range: 70-99 mg/dL 247 (H) 175 (H) 222 (H) 215 (H) 227 (H)   Increase Levemir to 15 units Thank you  Raoul Pitch BSN, RN,CDE Inpatient Diabetes Coordinator [phone] (team pager)

## 2015-03-21 NOTE — Progress Notes (Signed)
Advanced Heart Failure Rounding Note   Subjective:    Admitted 4/23 with recurrent HF. Had several episodes of flush pulmonary edema in setting of severe HTN.  Started on NTG drip which was titrated to 37mcg/min  Underwent R/L cath on 4/25 with showed 60% LM (confirmed by IVUS) and 95% L renal artery stenosis. (R ok)  RA = 2 RV = 35/1/3 PA = 31/11 (21) PCW = 12 Fick cardiac output/index = 3.5/2.8 PVR = 2.5 WU SVR = 2121  FA sat = 94% PA sat = 55%, 57%  On lasix 120 bid. Feels short of breath this am. CVP 11. Had urinary retention and 500cc out with Foley placement,  Objective:   Weight Range:  Vital Signs:   Temp:  [97.3 F (36.3 C)-97.9 F (36.6 C)] 97.3 F (36.3 C) (04/28 1900) Pulse Rate:  [96-110] 104 (04/28 1900) Resp:  [15-28] 21 (04/28 1900) BP: (126-157)/(49-67) 138/62 mmHg (04/28 1900) SpO2:  [91 %-100 %] 95 % (04/28 1900) FiO2 (%):  [40 %] 40 % (04/28 1600) Weight:  [72.6 kg (160 lb 0.9 oz)] 72.6 kg (160 lb 0.9 oz) (04/28 0335) Last BM Date: 03/20/15  Weight change: Filed Weights   03/19/15 0500 03/20/15 0310 03/21/15 0335  Weight: 70 kg (154 lb 5.2 oz) 72.4 kg (159 lb 9.8 oz) 72.6 kg (160 lb 0.9 oz)    Intake/Output:   Intake/Output Summary (Last 24 hours) at 03/21/15 2021 Last data filed at 03/21/15 1900  Gross per 24 hour  Intake   1532 ml  Output   2390 ml  Net   -858 ml     Physical Exam: General:  ElderlySitting in chair HEENT: normal Neck: supple. JVP 11 Carotids 2+ bilat; no bruits. No lymphadenopathy or thryomegaly appreciated. Cor: PMI nondisplaced. Regular rate & rhythm. No rubs, gallops or murmurs. Lungs: basilar crackles Abdomen: soft, nontender, nondistended. No hepatosplenomegaly. No bruits or masses. Good bowel sounds. Extremities: no cyanosis, clubbing, rash, trace edema Neuro: alert & orientedx3, cranial nerves grossly intact. moves all 4 extremities w/o difficulty. Affect pleasant  Telemetry: Sinus 90-100  Labs: Basic  Metabolic Panel:  Recent Labs Lab 03/16/15 0840  03/18/15 0530 03/18/15 1400 03/18/15 1530 03/19/15 0520 03/20/15 0455 03/21/15 0335  NA  --   < > 132* 131*  --  130* 124* 122*  K  --   < > 3.1* 3.4*  --  3.3* 4.0 3.5  CL  --   < > 91* 91*  --  90* 86* 83*  CO2  --   < > 29 29  --  27 25 25   GLUCOSE  --   < > 192* 220*  --  178* 159* 194*  BUN  --   < > 33* 29*  --  37* 51* 51*  CREATININE  --   < > 1.54* 1.42* 1.49* 1.89* 3.11* 3.20*  CALCIUM  --   < > 8.1* 8.1*  --  8.0* 8.0* 8.4  MG 2.1  --   --   --   --   --   --   --   < > = values in this interval not displayed.  Liver Function Tests: No results for input(s): AST, ALT, ALKPHOS, BILITOT, PROT, ALBUMIN in the last 168 hours. No results for input(s): LIPASE, AMYLASE in the last 168 hours. No results for input(s): AMMONIA in the last 168 hours.  CBC:  Recent Labs Lab 03/16/15 0650  03/18/15 0530 03/18/15 1530 03/19/15 0520 03/20/15 0455  03/21/15 0335  WBC 10.7*  --  10.9* 9.5 10.3 10.7* 11.0*  NEUTROABS 8.9*  --   --   --   --   --   --   HGB 10.4*  < > 9.1* 9.1* 9.0* 8.6* 8.8*  HCT 31.9*  < > 27.9* 27.7* 27.4* 26.3* 26.2*  MCV 86.4  --  86.6 85.8 85.9 84.8 83.7  PLT 318  --  319 319 304 307 347  < > = values in this interval not displayed.  Cardiac Enzymes:  Recent Labs Lab 03/17/15 1600 03/17/15 2001  CKTOTAL 92  --   CKMB 4.0  --   TROPONINI 0.12* 0.12*    BNP: BNP (last 3 results)  Recent Labs  02/14/15 0247 03/16/15 0650  BNP 1346.9* 648.2*    ProBNP (last 3 results)  Recent Labs  03/01/15 0840  PROBNP 742.0*      Other results:  Imaging: No results found.   Medications:     Scheduled Medications: . amLODipine  5 mg Oral Daily  . antiseptic oral rinse  7 mL Mouth Rinse q12n4p  . aspirin EC  81 mg Oral Daily  . atorvastatin  10 mg Oral Daily  . calcium citrate  1 tablet Oral Daily  . carvedilol  3.125 mg Oral BID WC  . chlorhexidine  15 mL Mouth Rinse BID  .  cholecalciferol  1,000 Units Oral Daily  . docusate sodium  100 mg Oral BID  . folic acid-pyridoxine-cyancobalamin  1 tablet Oral BID  . furosemide  120 mg Intravenous BID  . heparin  5,000 Units Subcutaneous 3 times per day  . hydrALAZINE  100 mg Oral 3 times per day  . insulin aspart  0-5 Units Subcutaneous QHS  . insulin aspart  0-9 Units Subcutaneous TID WC  . insulin detemir  10 Units Subcutaneous Daily  . iron polysaccharides  150 mg Oral BID  . latanoprost  1 drop Both Eyes QHS  . linagliptin  5 mg Oral Daily  . sodium chloride  10-40 mL Intracatheter Q12H  . sodium chloride  3 mL Intravenous Q12H  . vitamin C  1,000 mg Oral Daily  . vitamin E  400 Units Oral Daily    Infusions: . nitroGLYCERIN 90 mcg/min (03/21/15 1800)    PRN Medications: sodium chloride, acetaminophen, ALPRAZolam, alum & mag hydroxide-simeth, hydrALAZINE, nitroGLYCERIN, ondansetron (ZOFRAN) IV, sodium chloride, sodium chloride, zolpidem   Assessment:   1. Acute respiratory failure due flash pulmonary edema 2. Acute on chronic systolic HF, EF 36-62% 3. CKD stage 3 4. DM2 5. LBBB 6. HTN 7. Hypokalemia 8. Acute on chronic renal failure, now stage 4 9. CAD with 60% LM lesion 10. Unilateral L renal artery stenosis   Plan/Discussion:    CVP still up. Will continue lasix.   Creatinine now seems to be plateauing - hopefully will turn the corner in the am. Renal following.   Continue to wean IV NTG. Hydralazine at 100 tid. Will add amlodipine. Keep SBP > 130 for renal perfusion.  Given underlying CKD and severe HTN with episodes of flash pulmonary I think she needs stent of L renal artery. I have reviewed with Dr. Trula Slade who agrees. Given progressive renal dysfunction will likely have to wait until next week.   HGb dropping slowly. Will continue to follow.   No ACE or ARB with renal failure. Keep b-blocker at low dose.    Length of Stay: 5 [name] MD 03/21/2015, 8:21 PM Advanced  Heart  Failure Team Pager [phone] (M-F; 7a - 4p)  Please contact [facility] for night-coverage after hours (4p -7a ) and weekends on [email or web address]

## 2015-03-21 NOTE — Progress Notes (Signed)
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 antiseptic oral rinse  7 mL Mouth Rinse q12n4p  . aspirin EC  81 mg Oral Daily  . atorvastatin  10 mg Oral Daily  . calcium citrate  1 tablet Oral Daily  . carvedilol  3.125 mg Oral BID WC  . chlorhexidine  15 mL Mouth Rinse BID  . cholecalciferol  1,000 Units Oral Daily  . docusate sodium  100 mg Oral BID  . folic acid-pyridoxine-cyancobalamin  1 tablet Oral BID  . furosemide  120 mg Intravenous BID  . heparin  5,000 Units Subcutaneous 3 times per day  . hydrALAZINE  100 mg Oral 3 times per day  . insulin aspart  0-5 Units Subcutaneous QHS  . insulin aspart  0-9 Units Subcutaneous TID WC  . insulin detemir  10 Units Subcutaneous Daily  . iron polysaccharides  150 mg Oral BID  . isosorbide mononitrate  30 mg Oral Daily  . latanoprost  1 drop Both Eyes QHS  . linagliptin  5 mg Oral Daily  . sodium chloride  10-40 mL Intracatheter Q12H  . sodium chloride  3 mL Intravenous Q12H  . vitamin C  1,000 mg Oral Daily  . vitamin E  400 Units Oral Daily   Continuous Infusions: . nitroGLYCERIN 100 mcg/min (03/20/15 1700)   PRN Meds:.sodium chloride, acetaminophen, ALPRAZolam, alum & mag hydroxide-simeth, hydrALAZINE, nitroGLYCERIN, ondansetron (ZOFRAN) IV, sodium chloride, sodium chloride, zolpidem  Current Labs: reviewed    Physical Exam:  Blood pressure 139/61, pulse 105, temperature 97.7 F (36.5 C), temperature source Axillary, resp. rate 18, height 5\' 2"  (1.575 m), weight 72.6 kg (160 lb 0.9 oz), last menstrual period 07/24/1977,  SpO2 98 %. GEN: NAD, on BiPA this AM ENT:  NCAT EYES: EOMI CV: tachy, regular, no mgr PULM: CTAB, diminished in bases, coarse bs ABD: s/nt/nd, no abd bruits SKIN: No rashes/lesions IWP:[id number] LEE  A/P 1. 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 BL SCr 1.4-1.6, suspect multifactorial etiology including DM/HTN/ASCVD/RAS 2. Minimal UOP and brisk inc in SCr over past 24h 3. Renal US 01/2015 w/o structural issues 4. Very well could need HD within next several days 5. No HD catheter today 6. Daily weights, Daily Renal Panel, Strict I/Os, Avoid nephrotoxins (NSAIDs, judicious IV Contrast) 2. Hyponatremia, Hypervolemia, presumed hypotonic 1. Suspect related to CHF 2. Fluid restrict to 1.2L today 3. Cont lasix diuresis 4. Check U and S Osms 3. L RAS secondary to atherosclerotic disease 1. 95% ostial on 03/18/15 angiogram 2. Agree with eventual stent placement but patient is currently not stable 3. Would like to have returned to baseline GFR before repeat angiogram 2. Acute on chronic systolic heart failure: Managed by AHF 3. Anemia  Pearson Grippe MD 03/21/2015, 8:59 AM   Recent Labs Lab 03/19/15 0520 03/20/15 0455 03/21/15 0335  NA 130* 124* 122*  K 3.3* 4.0 3.5  CL 90* 86* 83*  CO2 27 25 25   GLUCOSE 178* 159* 194*  BUN 37* 51* 51*  CREATININE 1.89* 3.11* 3.20*  CALCIUM  8.0* 8.0* 8.4    Recent Labs Lab 03/16/15 0650  03/19/15 0520 03/20/15 0455 03/21/15 0335  WBC 10.7*  < > 10.3 10.7* 11.0*  NEUTROABS 8.9*  --   --   --   --   HGB 10.4*  < > 9.0* 8.6* 8.8*  HCT 31.9*  < > 27.4* 26.3* 26.2*  MCV 86.4  < > 85.9 84.8 83.7  PLT 318  < > 304 307 347  < > = values in this interval not displayed.

## 2015-03-22 ENCOUNTER — Ambulatory Visit: Payer: Medicare Other | Admitting: Nurse Practitioner

## 2015-03-22 ENCOUNTER — Telehealth: Payer: Self-pay | Admitting: *Deleted

## 2015-03-22 LAB — BASIC METABOLIC PANEL
Anion gap: 16 — ABNORMAL HIGH (ref 5–15)
BUN: 54 mg/dL — ABNORMAL HIGH (ref 6–23)
CO2: 27 mmol/L (ref 19–32)
CREATININE: 3.3 mg/dL — AB (ref 0.50–1.10)
Calcium: 8.2 mg/dL — ABNORMAL LOW (ref 8.4–10.5)
Chloride: 77 mmol/L — ABNORMAL LOW (ref 96–112)
GFR calc Af Amer: 14 mL/min — ABNORMAL LOW (ref >90)
GFR calc non Af Amer: 12 mL/min — ABNORMAL LOW (ref >90)
Glucose, Bld: 308 mg/dL — ABNORMAL HIGH (ref 70–99)
Potassium: 2.6 mmol/L — CL (ref 3.5–5.1)
SODIUM: 120 mmol/L — AB (ref 135–145)

## 2015-03-22 LAB — GLUCOSE, CAPILLARY
Glucose-Capillary: 202 mg/dL — ABNORMAL HIGH (ref 70–99)
Glucose-Capillary: 283 mg/dL — ABNORMAL HIGH (ref 70–99)
Glucose-Capillary: 191 mg/dL — ABNORMAL HIGH (ref 70–99)
Glucose-Capillary: 156 mg/dL — ABNORMAL HIGH (ref 70–99)

## 2015-03-22 LAB — CARBOXYHEMOGLOBIN
Carboxyhemoglobin: 1.2 % (ref 0.5–1.5)
Methemoglobin: 1.2 % (ref 0.0–1.5)
O2 SAT: 65.5 %
TOTAL HEMOGLOBIN: 8.8 g/dL — AB (ref 12.0–16.0)

## 2015-03-22 LAB — CBC
HCT: 24 % — ABNORMAL LOW (ref 36.0–46.0)
Hemoglobin: 8 g/dL — ABNORMAL LOW (ref 12.0–15.0)
MCH: 28.1 pg (ref 26.0–34.0)
MCHC: 33.3 g/dL (ref 30.0–36.0)
MCV: 84.2 fL (ref 78.0–100.0)
Platelets: 333 10*3/uL (ref 150–400)
RBC: 2.85 MIL/uL — ABNORMAL LOW (ref 3.87–5.11)
RDW: 14.6 % (ref 11.5–15.5)
WBC: 8.9 10*3/uL (ref 4.0–10.5)

## 2015-03-22 LAB — POTASSIUM: Potassium: 3.5 mmol/L (ref 3.5–5.1)

## 2015-03-22 MED ORDER — POTASSIUM CHLORIDE CRYS ER 10 MEQ PO TBCR
40.0000 meq | EXTENDED_RELEASE_TABLET | Freq: Four times a day (QID) | ORAL | Status: AC
  Administered 2015-03-22 (×2): 40 meq via ORAL
  Filled 2015-03-22: qty 2
  Filled 2015-03-22 (×2): qty 4

## 2015-03-22 MED ORDER — ISOSORBIDE MONONITRATE ER 30 MG PO TB24
30.0000 mg | ORAL_TABLET | Freq: Every day | ORAL | Status: DC
  Administered 2015-03-22 – 2015-03-25 (×4): 30 mg via ORAL
  Filled 2015-03-22 (×5): qty 1

## 2015-03-22 MED ORDER — POTASSIUM CHLORIDE CRYS ER 20 MEQ PO TBCR
40.0000 meq | EXTENDED_RELEASE_TABLET | Freq: Once | ORAL | Status: AC
  Administered 2015-03-22: 40 meq via ORAL
  Filled 2015-03-22: qty 2

## 2015-03-22 MED ORDER — SPIRONOLACTONE 12.5 MG HALF TABLET
12.5000 mg | ORAL_TABLET | Freq: Every day | ORAL | Status: DC
  Administered 2015-03-22 – 2015-04-02 (×11): 12.5 mg via ORAL
  Filled 2015-03-22 (×12): qty 1

## 2015-03-22 MED ORDER — TORSEMIDE 20 MG PO TABS
40.0000 mg | ORAL_TABLET | Freq: Two times a day (BID) | ORAL | Status: DC
  Administered 2015-03-23 – 2015-03-25 (×5): 40 mg via ORAL
  Filled 2015-03-22 (×7): qty 2

## 2015-03-22 MED ORDER — INSULIN DETEMIR 100 UNIT/ML ~~LOC~~ SOLN
15.0000 [IU] | Freq: Every day | SUBCUTANEOUS | Status: DC
  Administered 2015-03-24 – 2015-03-27 (×4): 15 [IU] via SUBCUTANEOUS
  Filled 2015-03-22 (×5): qty 0.15

## 2015-03-22 NOTE — Progress Notes (Signed)
Visited patients room to place her on Bipap night rest.  Patient feeling sick on stomach and may have to vomit.  Did not want to place patient on bipap and chance her aspirating.  RN made awake of decision and agreed.  Pt respiratory status was fine.. No distress noted.

## 2015-03-22 NOTE — Telephone Encounter (Signed)
Pt's husband called and to let me know that pt is in the hospital. I gave my apologies for lmom to go over renal doppler results. Husband said no problem it was ok. He has been notified of renal results. I asked tell pt we hope she feels better soon.

## 2015-03-22 NOTE — Progress Notes (Signed)
Advanced Heart Failure Rounding Note   Subjective:    Admitted 4/23 with recurrent HF. Had several episodes of flush pulmonary edema in setting of severe HTN.  Started on NTG drip which was titrated to 39mcg/min  Underwent R/L cath on 4/25 with showed 60% LM (confirmed by IVUS) and 95% L renal artery stenosis. (R ok)  RA = 2 RV = 35/1/3 PA = 31/11 (21) PCW = 12 Fick cardiac output/index = 3.5/2.8 PVR = 2.5 WU SVR = 2121  FA sat = 94% PA sat = 55%, 57%  Had urinary retention on 4/28 and 500cc out. Last night again had urinary retention with 650 cc on in/out cath. Remains on lasix 120 bid. Weight down 4 pounds. Breathing better. Co-ox 66%. Sodium 120 K 2.6. CVP 6.   Cr up again slightly 3.2 -> 3.3. SBP improved 115-150. Down to 27mcg/min on IV NTG  Objective:   Weight Range:  Vital Signs:   Temp:  [97.3 F (36.3 C)-98.1 F (36.7 C)] 97.4 F (36.3 C) (04/29 0739) Pulse Rate:  [96-110] 107 (04/29 0826) Resp:  [14-28] 21 (04/29 0739) BP: (116-148)/(46-68) 126/68 mmHg (04/29 0826) SpO2:  [91 %-100 %] 96 % (04/29 0739) FiO2 (%):  [40 %] 40 % (04/28 1600) Weight:  [71.1 kg (156 lb 12 oz)] 71.1 kg (156 lb 12 oz) (04/29 0500) Last BM Date: 03/20/15  Weight change: Filed Weights   03/20/15 0310 03/21/15 0335 03/22/15 0500  Weight: 72.4 kg (159 lb 9.8 oz) 72.6 kg (160 lb 0.9 oz) 71.1 kg (156 lb 12 oz)    Intake/Output:   Intake/Output Summary (Last 24 hours) at 03/22/15 0851 Last data filed at 03/22/15 0649  Gross per 24 hour  Intake 1010.5 ml  Output   2440 ml  Net -1429.5 ml     Physical Exam: General:  ElderlySitting in chair HEENT: normal Neck: supple. JVP 6 Carotids 2+ bilat; no bruits. No lymphadenopathy or thryomegaly appreciated. Cor: PMI nondisplaced. Regular rate & rhythm. No rubs, gallops or murmurs. Lungs: clear Abdomen: soft, nontender, nondistended. No hepatosplenomegaly. No bruits or masses. Good bowel sounds. Extremities: no cyanosis, clubbing, rash,  trace edema Neuro: alert & orientedx3, cranial nerves grossly intact. moves all 4 extremities w/o difficulty. Affect pleasant  Telemetry: Sinus 90-100  Labs: Basic Metabolic Panel:  Recent Labs Lab 03/16/15 0840  03/18/15 1400 03/18/15 1530 03/19/15 0520 03/20/15 0455 03/21/15 0335 03/22/15 0500  NA  --   < > 131*  --  130* 124* 122* 120*  K  --   < > 3.4*  --  3.3* 4.0 3.5 2.6*  CL  --   < > 91*  --  90* 86* 83* 77*  CO2  --   < > 29  --  27 25 25 27   GLUCOSE  --   < > 220*  --  178* 159* 194* 308*  BUN  --   < > 29*  --  37* 51* 51* 54*  CREATININE  --   < > 1.42* 1.49* 1.89* 3.11* 3.20* 3.30*  CALCIUM  --   < > 8.1*  --  8.0* 8.0* 8.4 8.2*  MG 2.1  --   --   --   --   --   --   --   < > = values in this interval not displayed.  Liver Function Tests: No results for input(s): AST, ALT, ALKPHOS, BILITOT, PROT, ALBUMIN in the last 168 hours. No results for input(s): LIPASE, AMYLASE in  the last 168 hours. No results for input(s): AMMONIA in the last 168 hours.  CBC:  Recent Labs Lab 03/16/15 0650  03/18/15 1530 03/19/15 0520 03/20/15 0455 03/21/15 0335 03/22/15 0500  WBC 10.7*  < > 9.5 10.3 10.7* 11.0* 8.9  NEUTROABS 8.9*  --   --   --   --   --   --   HGB 10.4*  < > 9.1* 9.0* 8.6* 8.8* 8.0*  HCT 31.9*  < > 27.7* 27.4* 26.3* 26.2* 24.0*  MCV 86.4  < > 85.8 85.9 84.8 83.7 84.2  PLT 318  < > 319 304 307 347 333  < > = values in this interval not displayed.  Cardiac Enzymes:  Recent Labs Lab 03/17/15 1600 03/17/15 2001  CKTOTAL 92  --   CKMB 4.0  --   TROPONINI 0.12* 0.12*    BNP: BNP (last 3 results)  Recent Labs  02/14/15 0247 03/16/15 0650  BNP 1346.9* 648.2*    ProBNP (last 3 results)  Recent Labs  03/01/15 0840  PROBNP 742.0*      Other results:  Imaging: No results found.   Medications:     Scheduled Medications: . amLODipine  5 mg Oral Daily  . antiseptic oral rinse  7 mL Mouth Rinse q12n4p  . aspirin EC  81 mg Oral Daily   . atorvastatin  10 mg Oral Daily  . calcium citrate  1 tablet Oral Daily  . carvedilol  3.125 mg Oral BID WC  . chlorhexidine  15 mL Mouth Rinse BID  . cholecalciferol  1,000 Units Oral Daily  . docusate sodium  100 mg Oral BID  . folic acid-pyridoxine-cyancobalamin  1 tablet Oral BID  . furosemide  120 mg Intravenous BID  . heparin  5,000 Units Subcutaneous 3 times per day  . hydrALAZINE  100 mg Oral 3 times per day  . insulin aspart  0-5 Units Subcutaneous QHS  . insulin aspart  0-9 Units Subcutaneous TID WC  . insulin detemir  10 Units Subcutaneous Daily  . iron polysaccharides  150 mg Oral BID  . latanoprost  1 drop Both Eyes QHS  . linagliptin  5 mg Oral Daily  . potassium chloride  40 mEq Oral Q6H  . sodium chloride  10-40 mL Intracatheter Q12H  . sodium chloride  3 mL Intravenous Q12H  . spironolactone  12.5 mg Oral Daily  . vitamin C  1,000 mg Oral Daily  . vitamin E  400 Units Oral Daily    Infusions: . nitroGLYCERIN 25 mcg/min (03/22/15 0649)    PRN Medications: sodium chloride, acetaminophen, ALPRAZolam, alum & mag hydroxide-simeth, hydrALAZINE, nitroGLYCERIN, ondansetron (ZOFRAN) IV, sodium chloride, sodium chloride, zolpidem   Assessment:   1. Acute respiratory failure due flash pulmonary edema 2. Acute on chronic systolic HF, EF 06-26% 3. CKD stage 3 4. DM2 5. LBBB 6. HTN 7. Hypokalemia/hyponatremia 8. Acute on chronic renal failure, now stage 4 9. CAD with 60% LM lesion 10. Unilateral L renal artery stenosis   Plan/Discussion:    Volume status down. Stop IV lasix, Switch to po demadex tomorrow.   Creatinine now seems to be plateauing. Rapid drop in potassium suggests renal improvement. I am hopeful creatinine will turn corner in am. Renal following.   Stop IV NTG. Hydralazine at 100 tid. Amlodipine added. Keep SBP > 130 for renal perfusion.  Given underlying CKD and severe HTN with episodes of flash pulmonary I think she needs stent of L renal  artery. I  have reviewed with Dr. Trula Slade who agrees. Given progressive renal dysfunction will likely have to wait until next week.   No ACE or ARB with renal failure. Keep b-blocker at low dose.   PT to see. Will likely need SNF or CIR prior to going home.    Length of Stay: 6 [name] MD 03/22/2015, 8:51 AM Advanced Heart Failure Team Pager [phone] (M-F; 7a - 4p)  Please contact [facility] for night-coverage after hours (4p -7a ) and weekends on [email or web address]

## 2015-03-22 NOTE — Progress Notes (Signed)
CRITICAL VALUE ALERT  Critical value received:  K+ 2.6 Date of notification:  03/22/15 Time of notification: 0620  Critical value read back: yes  Nurse who received alert:  Duanne Moron RN  Time MD responded:  Cards PA responded at [place], orders placed

## 2015-03-22 NOTE — Progress Notes (Signed)
Inpatient Diabetes Program Recommendations  AACE/ADA: New Consensus Statement on Inpatient Glycemic Control (2013)  Target Ranges:  Prepandial:   less than 140 mg/dL      Peak postprandial:   less than 180 mg/dL (1-2 hours)      Critically ill patients:  140 - 180 mg/dL   Reason for Visit: elevated fasting blood sugars  Diabetes history: Type 2 Outpatient Diabetes medications: Glyburide 5mg  bid, Januvia 50 mg bid (ordered 1x/day) Current orders for Inpatient glycemic control: Levemir 10 units q day, Novolog sensitive correction scale tid and hs  Fasting CBG remain elevated- please increase basal insulin to Levemir 15 units qday (0.2units/kg)  Gentry Fitz, RN, BA, MHA, CDE Diabetes Coordinator Inpatient Diabetes Program  [phone] (Team Pager) [phone] [facility]) 03/22/2015 11:25 AM

## 2015-03-22 NOTE — Progress Notes (Signed)
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 amLODipine  5 mg Oral Daily  . antiseptic oral rinse  7 mL Mouth Rinse q12n4p  . aspirin EC  81 mg Oral Daily  . atorvastatin  10 mg Oral Daily  . calcium citrate  1 tablet Oral Daily  . carvedilol  3.125 mg Oral BID WC  . chlorhexidine  15 mL Mouth Rinse BID  . cholecalciferol  1,000 Units Oral Daily  . docusate sodium  100 mg Oral BID  . folic acid-pyridoxine-cyancobalamin  1 tablet Oral BID  . heparin  5,000 Units Subcutaneous 3 times per day  . hydrALAZINE  100 mg Oral 3 times per day  . insulin aspart  0-5 Units Subcutaneous QHS  . insulin aspart  0-9 Units Subcutaneous TID WC  . insulin detemir  10 Units Subcutaneous Daily  . iron polysaccharides  150 mg Oral BID  . isosorbide mononitrate  30 mg Oral Daily  . latanoprost  1 drop Both Eyes QHS  . linagliptin  5 mg Oral Daily  . potassium chloride  40 mEq Oral Q6H  . sodium chloride  10-40 mL Intracatheter Q12H  . sodium chloride  3 mL Intravenous Q12H  . spironolactone  12.5 mg Oral Daily  . [START ON 03/23/2015] torsemide  40 mg Oral BID  . vitamin C  1,000 mg Oral Daily  . vitamin E  400 Units Oral Daily   Continuous Infusions: . nitroGLYCERIN 25 mcg/min (03/22/15 0649)   PRN Meds:.sodium chloride, acetaminophen, ALPRAZolam, alum & mag hydroxide-simeth, hydrALAZINE, nitroGLYCERIN, ondansetron (ZOFRAN) IV, sodium  chloride, sodium chloride, zolpidem  Current Labs: reviewed    Physical Exam:  Blood pressure 126/68, pulse 108, temperature 97.4 F (36.3 C), temperature source Oral, resp. rate 21, height 5\' 2"  (1.575 m), weight 71.1 kg (156 lb 12 oz), last menstrual period 07/24/1977, SpO2 96 %. GEN: NAD, on BiPA this AM ENT:  NCAT EYES: EOMI CV: tachy, regular, no mgr PULM: CTAB, diminished in bases, coarse bs ABD: s/nt/nd, no abd bruits SKIN: No rashes/lesions SPQ:ZRAQT LEE  A/P 1. 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 BL SCr 1.4-1.6, suspect chronic disease has multifactorial etiology including DM/HTN/ASCVD/RAS 2. Much improved urine output, plateaued kidney function over past 48 hours 3. Anticipate recovery of GFR in near future, unlikely to need dialysis 4. No need for HD catheter 5. Renal US 01/2015 w/o structural issues 6. Daily weights, Daily Renal Panel, Strict I/Os, Avoid nephrotoxins (NSAIDs, judicious IV Contrast) 2. Hyponatremia, Hypervolemia,  1. Urine osmolality is inappropriately elevated, interestingly serum osmolarity was normal 2. Suspect related to CHF 3. Fluid restrict to 1.2L 4. Continue to monitor 3. L RAS secondary to atherosclerotic disease 1. 95% ostial on 03/18/15 angiogram 2. Agree with eventual stent placement but patient is currently not stable 3. Would like to have returned to baseline GFR before repeat angiogram 2. Acute on chronic systolic heart failure: Managed by AHF 3. Anemia  Pearson Grippe MD 03/22/2015, 9:07 AM   Recent Labs Lab 03/20/15 0455 03/21/15 0335 03/22/15 0500  NA 124* 122* 120*  K 4.0 3.5 2.6*  CL 86* 83* 77*  CO2 25 25 27   GLUCOSE 159* 194* 308*  BUN 51* 51* 54*  CREATININE 3.11* 3.20* 3.30*  CALCIUM 8.0* 8.4 8.2*    Recent Labs Lab 03/16/15 0650  03/20/15 0455 03/21/15 0335 03/22/15 0500  WBC 10.7*  < > 10.7* 11.0* 8.9  NEUTROABS 8.9*  --   --   --   --   HGB 10.4*  < > 8.6* 8.8* 8.0*  HCT 31.9*  < > 26.3* 26.2* 24.0*  MCV 86.4  < > 84.8  83.7 84.2  PLT 318  < > 307 347 333  < > = values in this interval not displayed.

## 2015-03-23 ENCOUNTER — Inpatient Hospital Stay (HOSPITAL_COMMUNITY): Payer: Medicare Other

## 2015-03-23 DIAGNOSIS — R0602 Shortness of breath: Secondary | ICD-10-CM

## 2015-03-23 DIAGNOSIS — I447 Left bundle-branch block, unspecified: Secondary | ICD-10-CM

## 2015-03-23 LAB — GLUCOSE, CAPILLARY
GLUCOSE-CAPILLARY: 262 mg/dL — AB (ref 70–99)
GLUCOSE-CAPILLARY: 299 mg/dL — AB (ref 70–99)
GLUCOSE-CAPILLARY: 224 mg/dL — AB (ref 70–99)
Glucose-Capillary: 237 mg/dL — ABNORMAL HIGH (ref 70–99)

## 2015-03-23 LAB — BASIC METABOLIC PANEL
Anion gap: 12 (ref 5–15)
BUN: 56 mg/dL — ABNORMAL HIGH (ref 6–23)
CO2: 28 mmol/L (ref 19–32)
Calcium: 9 mg/dL (ref 8.4–10.5)
Chloride: 87 mmol/L — ABNORMAL LOW (ref 96–112)
Creatinine, Ser: 3.07 mg/dL — ABNORMAL HIGH (ref 0.50–1.10)
GFR calc Af Amer: 16 mL/min — ABNORMAL LOW (ref >90)
GFR calc non Af Amer: 13 mL/min — ABNORMAL LOW (ref >90)
Glucose, Bld: 185 mg/dL — ABNORMAL HIGH (ref 70–99)
Potassium: 4.1 mmol/L (ref 3.5–5.1)
Sodium: 127 mmol/L — ABNORMAL LOW (ref 135–145)

## 2015-03-23 LAB — CBC
HEMATOCRIT: 27.4 % — AB (ref 36.0–46.0)
Hemoglobin: 9 g/dL — ABNORMAL LOW (ref 12.0–15.0)
MCH: 27.8 pg (ref 26.0–34.0)
MCHC: 32.8 g/dL (ref 30.0–36.0)
MCV: 84.6 fL (ref 78.0–100.0)
Platelets: 392 10*3/uL (ref 150–400)
RBC: 3.24 MIL/uL — AB (ref 3.87–5.11)
RDW: 15 % (ref 11.5–15.5)
WBC: 10.5 10*3/uL (ref 4.0–10.5)

## 2015-03-23 LAB — POCT I-STAT 3, ART BLOOD GAS (G3+)
Acid-base deficit: 1 mmol/L (ref 0.0–2.0)
Bicarbonate: 24.3 mEq/L — ABNORMAL HIGH (ref 20.0–24.0)
O2 Saturation: 97 %
PCO2 ART: 39.7 mmHg (ref 35.0–45.0)
PH ART: 7.394 (ref 7.350–7.450)
PO2 ART: 89 mmHg (ref 80.0–100.0)
Patient temperature: 98.1
TCO2: 26 mmol/L (ref 0–100)

## 2015-03-23 LAB — CARBOXYHEMOGLOBIN
CARBOXYHEMOGLOBIN: 1 % (ref 0.5–1.5)
METHEMOGLOBIN: 1 % (ref 0.0–1.5)
O2 SAT: 67.4 %
Total hemoglobin: 8.9 g/dL — ABNORMAL LOW (ref 12.0–16.0)

## 2015-03-23 MED ORDER — FUROSEMIDE 10 MG/ML IJ SOLN
40.0000 mg | Freq: Once | INTRAMUSCULAR | Status: AC
  Administered 2015-03-23: 40 mg via INTRAVENOUS

## 2015-03-23 MED ORDER — FUROSEMIDE 10 MG/ML IJ SOLN
120.0000 mg | Freq: Once | INTRAVENOUS | Status: AC
  Administered 2015-03-23: 120 mg via INTRAVENOUS
  Filled 2015-03-23: qty 12

## 2015-03-23 MED ORDER — FUROSEMIDE 10 MG/ML IJ SOLN
120.0000 mg | Freq: Two times a day (BID) | INTRAVENOUS | Status: DC
  Administered 2015-03-24 – 2015-03-25 (×3): 120 mg via INTRAVENOUS
  Filled 2015-03-23 (×5): qty 12

## 2015-03-23 MED ORDER — FUROSEMIDE 10 MG/ML IJ SOLN
INTRAMUSCULAR | Status: AC
  Administered 2015-03-23: 80 mg
  Filled 2015-03-23: qty 12

## 2015-03-23 MED ORDER — AMLODIPINE BESYLATE 10 MG PO TABS: 10.0000 mg | ORAL_TABLET | Freq: Every day | ORAL | Status: DC

## 2015-03-23 MED ORDER — AMLODIPINE BESYLATE 10 MG PO TABS
10.0000 mg | ORAL_TABLET | Freq: Every day | ORAL | Status: DC
  Administered 2015-03-23 – 2015-03-30 (×8): 10 mg via ORAL
  Filled 2015-03-23 (×9): qty 1

## 2015-03-23 NOTE — Progress Notes (Signed)
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 amLODipine  5 mg Oral Daily  . antiseptic oral rinse  7 mL Mouth Rinse q12n4p  . aspirin EC  81 mg Oral Daily  . atorvastatin  10 mg Oral Daily  . calcium citrate  1 tablet Oral Daily  . carvedilol  3.125 mg Oral BID WC  . chlorhexidine  15 mL Mouth Rinse BID  . cholecalciferol  1,000 Units Oral Daily  . docusate sodium  100 mg Oral BID  . folic acid-pyridoxine-cyancobalamin  1 tablet Oral BID  . heparin  5,000 Units Subcutaneous 3 times per day  . hydrALAZINE  100 mg Oral 3 times per day  . insulin aspart  0-5 Units Subcutaneous QHS  . insulin aspart  0-9 Units Subcutaneous TID WC  . insulin detemir  15 Units Subcutaneous Daily  . iron polysaccharides  150 mg Oral BID  . isosorbide mononitrate  30 mg Oral Daily  . latanoprost  1 drop Both Eyes QHS  . linagliptin  5 mg Oral Daily  . sodium chloride  10-40 mL Intracatheter Q12H  . sodium chloride  3 mL Intravenous Q12H  . spironolactone  12.5 mg Oral Daily  . torsemide  40 mg Oral BID  . vitamin C  1,000 mg Oral Daily  . vitamin E  400 Units Oral Daily   Continuous Infusions: . nitroGLYCERIN Stopped (03/22/15 1300)   PRN Meds:.sodium chloride, acetaminophen, ALPRAZolam, alum & mag hydroxide-simeth, hydrALAZINE, nitroGLYCERIN, ondansetron (ZOFRAN) IV, sodium chloride, sodium chloride, zolpidem  Current Labs: reviewed    Physical Exam:  Blood pressure 140/60, pulse 116, temperature 97.9 F (36.6 C), temperature source Axillary, resp. rate 27, height 5\' 2"  (1.575 m), weight 70.262 kg  (154 lb 14.4 oz), last menstrual period 07/24/1977, SpO2 92 %. GEN: NAD, off BiPAP ENT:  NCAT EYES: EOMI CV: tachy, regular, no mgr PULM: CTAB, diminished in bases, coarse bs ABD: s/nt/nd, no abd bruits SKIN: No rashes/lesions CBJ:SEGBT LEE  A/P 1. 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 BL SCr 1.4-1.6, suspect chronic disease has multifactorial etiology including DM/HTN/ASCVD/RAS 2. Much improved urine output, plateaued kidney function over past 48 hours 3. Anticipate recovery of GFR in near future, unlikely to need dialysis 4. No need for HD catheter 5. Renal US 01/2015 w/o structural issues 6. Daily weights, Daily Renal Panel, Strict I/Os, Avoid nephrotoxins (NSAIDs, judicious IV Contrast) 2. Hyponatremia, Hypervolemia,  1. Urine osmolality is inappropriately elevated, interestingly serum osmolarity was normal 2. Suspect related to CHF 3. Fluid restrict to 1.2L 4. Continue to monitor 3. L RAS secondary to atherosclerotic disease 1. 95% ostial on 03/18/15 angiogram 2. Agree with eventual stent placement but patient is currently not stable 3. Would like to have returned to baseline GFR before repeat angiogram 2. Acute on chronic systolic heart failure: Managed by AHF 3. Anemia  Pearson Grippe MD 03/23/2015, 7:40 AM   Recent Labs Lab 03/21/15 0335 03/22/15 0500 03/22/15 1130 03/23/15 0429  NA 122* 120*  --  127*  K 3.5  2.6* 3.5 4.1  CL 83* 77*  --  87*  CO2 25 27  --  28  GLUCOSE 194* 308*  --  185*  BUN 51* 54*  --  56*  CREATININE 3.20* 3.30*  --  3.07*  CALCIUM 8.4 8.2*  --  9.0    Recent Labs Lab 03/21/15 0335 03/22/15 0500 03/23/15 0429  WBC 11.0* 8.9 10.5  HGB 8.8* 8.0* 9.0*  HCT 26.2* 24.0* 27.4*  MCV 83.7 84.2 84.6  PLT 347 333 392

## 2015-03-23 NOTE — Progress Notes (Addendum)
DAILY PROGRESS NOTE  Subjective:  Called to the bedside with patient in acute respiratory distress. Marked WOB - went on BIPAP this morning. BP improved at 151/61.C/o chest tightness - noted abdominal breathing and accessory muscle use. Urine output was good overnight with about 700 cc net negative.  Objective:  Temp:  [97.7 F (36.5 C)-98.1 F (36.7 C)] 98.1 F (36.7 C) (04/30 0700) Pulse Rate:  [97-124] 124 (04/30 0800) Resp:  [16-29] 29 (04/30 0800) BP: (123-151)/(46-86) 151/61 mmHg (04/30 0800) SpO2:  [88 %-99 %] 88 % (04/30 0800) FiO2 (%):  [40 %] 40 % (04/30 0400) Weight:  [154 lb 14.4 oz (70.262 kg)] 154 lb 14.4 oz (70.262 kg) (04/30 0500) Weight change: -1 lb 13.6 oz (-0.838 kg)  Intake/Output from previous day: 04/29 0701 - 04/30 0700 In: 833.9 [P.O.:420; I.V.:413.9] Out: 2025 [Urine:2025]  Intake/Output from this shift: Total I/O In: 10 [I.V.:10] Out: -   Medications: Current Facility-Administered Medications  Medication Dose Route Frequency Provider Last Rate Last Dose  . 0.9 %  sodium chloride infusion  250 mL Intravenous PRN Abelino Derrick, PA-C 10 mL/hr at 03/22/15 2221 250 mL at 03/22/15 2221  . acetaminophen (TYLENOL) tablet 650 mg  650 mg Oral Q4H PRN Abelino Derrick, PA-C   650 mg at 03/17/15 2343  . ALPRAZolam Prudy Feeler) tablet 0.25 mg  0.25 mg Oral BID PRN Abelino Derrick, PA-C   0.25 mg at 03/22/15 2213  . alum & mag hydroxide-simeth (MAALOX/MYLANTA) 200-200-20 MG/5ML suspension 15 mL  15 mL Oral Q4H PRN Quintella Reichert, MD   15 mL at 03/19/15 2241  . amLODipine (NORVASC) tablet 5 mg  5 mg Oral Daily Dolores Patty, MD   5 mg at 03/22/15 0958  . antiseptic oral rinse (CPC / CETYLPYRIDINIUM CHLORIDE 0.05%) solution 7 mL  7 mL Mouth Rinse q12n4p Quintella Reichert, MD   7 mL at 03/22/15 1600  . aspirin EC tablet 81 mg  81 mg Oral Daily Quintella Reichert, MD   81 mg at 03/22/15 0957  . atorvastatin (LIPITOR) tablet 10 mg  10 mg Oral Daily Abelino Derrick, PA-C   10 mg  at 03/22/15 1730  . calcium citrate (CALCITRATE - dosed in mg elemental calcium) tablet 200 mg of elemental calcium  1 tablet Oral Daily Eda Paschal Kilroy, PA-C   200 mg of elemental calcium at 03/22/15 0958  . carvedilol (COREG) tablet 3.125 mg  3.125 mg Oral BID WC Eda Paschal Kilroy, PA-C   3.125 mg at 03/22/15 1623  . chlorhexidine (PERIDEX) 0.12 % solution 15 mL  15 mL Mouth Rinse BID Quintella Reichert, MD   15 mL at 03/22/15 2100  . cholecalciferol (VITAMIN D) tablet 1,000 Units  1,000 Units Oral Daily Abelino Derrick, PA-C   1,000 Units at 03/22/15 0957  . docusate sodium (COLACE) capsule 100 mg  100 mg Oral BID Eda Paschal Kilroy, PA-C   100 mg at 03/22/15 2210  . folic acid-pyridoxine-cyancobalamin (FOLTX) 2.5-25-2 MG per tablet 1 tablet  1 tablet Oral BID Abelino Derrick, PA-C   1 tablet at 03/22/15 [phone]  . furosemide (LASIX) 120 mg in dextrose 5 % 50 mL IVPB  120 mg Intravenous Once Chrystie Nose, MD      . heparin injection 5,000 Units  5,000 Units Subcutaneous 3 times per day Dolores Patty, MD   5,000 Units at 03/23/15 [phone]  . hydrALAZINE (APRESOLINE) injection 10 mg  10  mg Intravenous Q4H PRN Jolaine Artist, MD   10 mg at 03/20/15 1700  . hydrALAZINE (APRESOLINE) tablet 100 mg  100 mg Oral 3 times per day Jolaine Artist, MD   100 mg at 03/23/15 1660  . insulin aspart (novoLOG) injection 0-5 Units  0-5 Units Subcutaneous QHS Erlene Quan, PA-C   2 Units at 03/20/15 2200  . insulin aspart (novoLOG) injection 0-9 Units  0-9 Units Subcutaneous TID WC Erlene Quan, PA-C   2 Units at 03/22/15 1623  . insulin detemir (LEVEMIR) injection 15 Units  15 Units Subcutaneous Daily Shaune Pascal Bensimhon, MD      . iron polysaccharides (NIFEREX) capsule 150 mg  150 mg Oral BID Doreene Burke Kilroy, PA-C   150 mg at 03/22/15 0957  . isosorbide mononitrate (IMDUR) 24 hr tablet 30 mg  30 mg Oral Daily Jolaine Artist, MD   30 mg at 03/22/15 0956  . latanoprost (XALATAN) 0.005 % ophthalmic solution 1 drop  1 drop  Both Eyes QHS Erlene Quan, PA-C   1 drop at 03/22/15 2213  . linagliptin (TRADJENTA) tablet 5 mg  5 mg Oral Daily Erlene Quan, PA-C   5 mg at 03/22/15 0959  . nitroGLYCERIN (NITROSTAT) SL tablet 0.4 mg  0.4 mg Sublingual Q5 Min x 3 PRN Doreene Burke Kilroy, PA-C      . nitroGLYCERIN 50 mg in dextrose 5 % 250 mL (0.2 mg/mL) infusion  0-200 mcg/min Intravenous Titrated Jolaine Artist, MD   Stopped at 03/22/15 1300  . ondansetron (ZOFRAN) injection 4 mg  4 mg Intravenous Q6H PRN Erlene Quan, PA-C   4 mg at 03/22/15 2213  . sodium chloride 0.9 % injection 10-40 mL  10-40 mL Intracatheter Q12H Darlin Coco, MD   10 mL at 03/22/15 2209  . sodium chloride 0.9 % injection 10-40 mL  10-40 mL Intracatheter PRN Darlin Coco, MD      . sodium chloride 0.9 % injection 3 mL  3 mL Intravenous Q12H Erlene Quan, PA-C   3 mL at 03/22/15 2209  . sodium chloride 0.9 % injection 3 mL  3 mL Intravenous PRN Erlene Quan, PA-C      . spironolactone (ALDACTONE) tablet 12.5 mg  12.5 mg Oral Daily Erlene Quan, PA-C   12.5 mg at 03/22/15 0959  . torsemide (DEMADEX) tablet 40 mg  40 mg Oral BID Jolaine Artist, MD      . vitamin C (ASCORBIC ACID) tablet 1,000 mg  1,000 mg Oral Daily Erlene Quan, PA-C   1,000 mg at 03/22/15 0957  . vitamin E capsule 400 Units  400 Units Oral Daily Erlene Quan, [place]   400 Units at 03/22/15 0957  . zolpidem (AMBIEN) tablet 5 mg  5 mg Oral QHS PRN Erlene Quan, PA-C   5 mg at 03/22/15 2215    Physical Exam: General appearance: alert, moderate distress, pale and on bipap Neck: no carotid bruit and no JVD Lungs: diminished breath sounds bilaterally and rales bibasilar Heart: regular tachycardia Abdomen: soft, non-tender; bowel sounds normal; no masses,  no organomegaly Extremities: extremities normal, atraumatic, no cyanosis or edema Pulses: 2+ and symmetric Skin: pale, warm, dry Neurologic: Grossly normal .  Lab Results: Results for orders placed or performed  during the hospital encounter of 03/16/15 (from the past 48 hour(s))  Osmolality     Status: None   Collection Time: 03/21/15 10:45 AM  Result Value Ref  Range   Osmolality 283 275 - 300 mOsm/kg    Comment: Performed at [facility]  Glucose, capillary     Status: Abnormal   Collection Time: 03/21/15 11:20 AM  Result Value Ref Range   Glucose-Capillary 263 (H) 70 - 99 mg/dL   Comment 1 Capillary Specimen   Osmolality, urine     Status: Abnormal   Collection Time: 03/21/15  1:40 PM  Result Value Ref Range   Osmolality, Ur 312 (L) 390 - 1090 mOsm/kg    Comment: Performed at [facility]  Glucose, capillary     Status: Abnormal   Collection Time: 03/21/15  4:23 PM  Result Value Ref Range   Glucose-Capillary 336 (H) 70 - 99 mg/dL   Comment 1 Capillary Specimen   Glucose, capillary     Status: Abnormal   Collection Time: 03/21/15  9:34 PM  Result Value Ref Range   Glucose-Capillary 132 (H) 70 - 99 mg/dL   Comment 1 Capillary Specimen   Basic metabolic panel     Status: Abnormal   Collection Time: 03/22/15  5:00 AM  Result Value Ref Range   Sodium 120 (L) 135 - 145 mmol/L   Potassium 2.6 (LL) 3.5 - 5.1 mmol/L    Comment: DELTA CHECK NOTED REPEATED TO VERIFY CRITICAL RESULT CALLED TO, READ BACK BY AND VERIFIED WITH: SWANSON K,RN 03/22/15 0615 WAYK    Chloride 77 (L) 96 - 112 mmol/L   CO2 27 19 - 32 mmol/L   Glucose, Bld 308 (H) 70 - 99 mg/dL   BUN 54 (H) 6 - 23 mg/dL   Creatinine, Ser 3.30 (H) 0.50 - 1.10 mg/dL   Calcium 8.2 (L) 8.4 - 10.5 mg/dL   GFR calc non Af Amer 12 (L) >90 mL/min   GFR calc Af Amer 14 (L) >90 mL/min    Comment: (NOTE) The eGFR has been calculated using the CKD EPI equation. This calculation has not been validated in all clinical situations. eGFR's persistently <90 mL/min signify possible Chronic Kidney Disease.    Anion gap 16 (H) 5 - 15  CBC     Status: Abnormal   Collection Time: 03/22/15  5:00 AM  Result Value Ref Range   WBC 8.9  4.0 - 10.5 K/uL   RBC 2.85 (L) 3.87 - 5.11 MIL/uL   Hemoglobin 8.0 (L) 12.0 - 15.0 g/dL   HCT 24.0 (L) 36.0 - 46.0 %   MCV 84.2 78.0 - 100.0 fL   MCH 28.1 26.0 - 34.0 pg   MCHC 33.3 30.0 - 36.0 g/dL   RDW 14.6 11.5 - 15.5 %   Platelets 333 150 - 400 K/uL  Carboxyhemoglobin     Status: Abnormal   Collection Time: 03/22/15  5:20 AM  Result Value Ref Range   Total hemoglobin 8.8 (L) 12.0 - 16.0 g/dL   O2 Saturation 65.5 %   Carboxyhemoglobin 1.2 0.5 - 1.5 %   Methemoglobin 1.2 0.0 - 1.5 %  Glucose, capillary     Status: Abnormal   Collection Time: 03/22/15  7:38 AM  Result Value Ref Range   Glucose-Capillary 202 (H) 70 - 99 mg/dL   Comment 1 Capillary Specimen   Potassium     Status: None   Collection Time: 03/22/15 11:30 AM  Result Value Ref Range   Potassium 3.5 3.5 - 5.1 mmol/L  Glucose, capillary     Status: Abnormal   Collection Time: 03/22/15 11:42 AM  Result Value Ref Range   Glucose-Capillary 283 (  H) 70 - 99 mg/dL   Comment 1 Capillary Specimen   Glucose, capillary     Status: Abnormal   Collection Time: 03/22/15  4:18 PM  Result Value Ref Range   Glucose-Capillary 191 (H) 70 - 99 mg/dL   Comment 1 Capillary Specimen   Glucose, capillary     Status: Abnormal   Collection Time: 03/22/15  9:44 PM  Result Value Ref Range   Glucose-Capillary 156 (H) 70 - 99 mg/dL   Comment 1 Capillary Specimen   Carboxyhemoglobin     Status: Abnormal   Collection Time: 03/23/15  4:22 AM  Result Value Ref Range   Total hemoglobin 8.9 (L) 12.0 - 16.0 g/dL   O2 Saturation 67.4 %   Carboxyhemoglobin 1.0 0.5 - 1.5 %   Methemoglobin 1.0 0.0 - 1.5 %  CBC     Status: Abnormal   Collection Time: 03/23/15  4:29 AM  Result Value Ref Range   WBC 10.5 4.0 - 10.5 K/uL   RBC 3.24 (L) 3.87 - 5.11 MIL/uL   Hemoglobin 9.0 (L) 12.0 - 15.0 g/dL   HCT 27.4 (L) 36.0 - 46.0 %   MCV 84.6 78.0 - 100.0 fL   MCH 27.8 26.0 - 34.0 pg   MCHC 32.8 30.0 - 36.0 g/dL   RDW 15.0 11.5 - 15.5 %   Platelets 392  150 - 400 K/uL  Basic metabolic panel     Status: Abnormal   Collection Time: 03/23/15  4:29 AM  Result Value Ref Range   Sodium 127 (L) 135 - 145 mmol/L    Comment: DELTA CHECK NOTED   Potassium 4.1 3.5 - 5.1 mmol/L   Chloride 87 (L) 96 - 112 mmol/L   CO2 28 19 - 32 mmol/L   Glucose, Bld 185 (H) 70 - 99 mg/dL   BUN 56 (H) 6 - 23 mg/dL   Creatinine, Ser 3.07 (H) 0.50 - 1.10 mg/dL   Calcium 9.0 8.4 - 10.5 mg/dL   GFR calc non Af Amer 13 (L) >90 mL/min   GFR calc Af Amer 16 (L) >90 mL/min    Comment: (NOTE) The eGFR has been calculated using the CKD EPI equation. This calculation has not been validated in all clinical situations. eGFR's persistently <90 mL/min signify possible Chronic Kidney Disease.    Anion gap 12 5 - 15  Glucose, capillary     Status: Abnormal   Collection Time: 03/23/15  7:51 AM  Result Value Ref Range   Glucose-Capillary 237 (H) 70 - 99 mg/dL   Comment 1 Capillary Specimen     Imaging: No results found.  Assessment:  1. Principal Problem: 2.   Acute respiratory failure with hypoxia 3. Active Problems: 4.   Essential hypertension 5.   Obesity-BMI 29 6.   Type 2 diabetes mellitus with renal manifestations 7.   Hyperlipidemia 8.   Elevated troponin-scar, no ischemia by Myoview 02/16/15 9.   Acute combined systolic and diastolic congestive heart failure 10.   Mitral valve regurgitation-moderate tro severe by echo 02/16/15 11.   LBBB (left bundle branch block) 12.   Chronic renal disease, stage III 13.   Cardiomyopathy- etiology undetermined. EF 30-35% echo 02/16/15.  14.   Acute combined systolic and diastolic heart failure 15.   SOB (shortness of breath) 16.   Plan:  1. Acute respiratory decompensation - possible recurrent flash pulmonary edema, however, lungs only with basilar crackles. Give IV lasix 120 mg now. Check STAT CXR. Obtained EKG which shows no  significant change in underlying LBBB pattern. Remains tachycardic. EF is 30-35% with diffuse  underlying moderate non-obstructive CAD by cath - ?60% LM lesion which was felt not to be significant by IVUS. Will check STAT ABG. Closely monitor respiratory status - may require intubation.   CRITICAL CARE:  The patient is critically ill with multi-organ system failure and requires high complexity decision making for assessment and support, frequent evaluation and titration of therapies, application of advanced monitoring technologies and extensive interpretation of multiple databases.  Time Spent Directly with Patient:  30 minutes  Length of Stay:  LOS: 7 days   Pixie Casino, MD, [facility] Attending Cardiologist [facility]  Danish Ruffins C 03/23/2015, 8:54 AM

## 2015-03-23 NOTE — Progress Notes (Signed)
Patient seen by Dr. Debara Pickett earlier today for respiratory distress.   Volume status better after IV lasix and Bipap.. CVP now 9-10. CXR improved.  Renal function seems to be improving. Will give another dose of lasix tonight. Increase norvasc to 10.   Tele looks like she may be in atrial tach which she is likely to toelrate poorly. I have asked Dr. Lovena Le to look at ECGs with me. Will provide incentive spirometer.    Shelagh Rayman,MD 1:13 PM

## 2015-03-24 LAB — GLUCOSE, CAPILLARY
GLUCOSE-CAPILLARY: 221 mg/dL — AB (ref 70–99)
GLUCOSE-CAPILLARY: 176 mg/dL — AB (ref 70–99)
Glucose-Capillary: 255 mg/dL — ABNORMAL HIGH (ref 70–99)
Glucose-Capillary: 168 mg/dL — ABNORMAL HIGH (ref 70–99)

## 2015-03-24 LAB — BASIC METABOLIC PANEL
Anion gap: 14 (ref 5–15)
BUN: 66 mg/dL — ABNORMAL HIGH (ref 6–20)
CO2: 28 mmol/L (ref 22–32)
Calcium: 8.8 mg/dL — ABNORMAL LOW (ref 8.9–10.3)
Chloride: 84 mmol/L — ABNORMAL LOW (ref 101–111)
Creatinine, Ser: 2.93 mg/dL — ABNORMAL HIGH (ref 0.44–1.00)
GFR calc Af Amer: 16 mL/min — ABNORMAL LOW (ref >60)
GFR calc non Af Amer: 14 mL/min — ABNORMAL LOW (ref >60)
Glucose, Bld: 199 mg/dL — ABNORMAL HIGH (ref 70–99)
Potassium: 3.3 mmol/L — ABNORMAL LOW (ref 3.5–5.1)
Sodium: 126 mmol/L — ABNORMAL LOW (ref 135–145)

## 2015-03-24 LAB — CBC
HCT: 27.1 % — ABNORMAL LOW (ref 36.0–46.0)
Hemoglobin: 8.9 g/dL — ABNORMAL LOW (ref 12.0–15.0)
MCH: 28 pg (ref 26.0–34.0)
MCHC: 32.8 g/dL (ref 30.0–36.0)
MCV: 85.2 fL (ref 78.0–100.0)
PLATELETS: 423 10*3/uL — AB (ref 150–400)
RBC: 3.18 MIL/uL — AB (ref 3.87–5.11)
RDW: 14.9 % (ref 11.5–15.5)
WBC: 11.4 10*3/uL — AB (ref 4.0–10.5)

## 2015-03-24 LAB — CARBOXYHEMOGLOBIN
Carboxyhemoglobin: 1.6 % — ABNORMAL HIGH (ref 0.5–1.5)
METHEMOGLOBIN: 1 % (ref 0.0–1.5)
O2 Saturation: 79.2 %
TOTAL HEMOGLOBIN: 8.2 g/dL — AB (ref 12.0–16.0)

## 2015-03-24 MED ORDER — POTASSIUM CHLORIDE CRYS ER 20 MEQ PO TBCR
20.0000 meq | EXTENDED_RELEASE_TABLET | Freq: Once | ORAL | Status: DC
  Filled 2015-03-24: qty 1

## 2015-03-24 MED ORDER — POTASSIUM CHLORIDE 10 MEQ/100ML IV SOLN
10.0000 meq | INTRAVENOUS | Status: AC
  Administered 2015-03-24 (×2): 10 meq via INTRAVENOUS
  Filled 2015-03-24 (×4): qty 100

## 2015-03-24 MED ORDER — POTASSIUM CHLORIDE ER 10 MEQ PO TBCR
20.0000 meq | EXTENDED_RELEASE_TABLET | Freq: Once | ORAL | Status: AC
  Administered 2015-03-24: 20 meq via ORAL
  Filled 2015-03-24: qty 2

## 2015-03-24 NOTE — Clinical Social Work Placement (Signed)
   CLINICAL SOCIAL WORK PLACEMENT  NOTE  Date:  03/24/2015  Patient Details  Name: Katie Fuentes MRN: [id number] Date of Birth: [birth year]  Clinical Social Work is seeking post-discharge placement for this patient at the [place] level of care (*CSW will initial, date and re-position this form in  chart as items are completed):  Yes   Patient/family provided with [facility] Work Department's list of facilities offering this level of care within the geographic area requested by the patient (or if unable, by the patient's family).  Yes   Patient/family informed of their freedom to choose among providers that offer the needed level of care, that participate in Medicare, Medicaid or managed care program needed by the patient, have an available bed and are willing to accept the patient.  Yes   Patient/family informed of [facility]'s ownership interest in [facility] and [facility], as well as of the fact that they are under no obligation to receive care at these facilities.  PASRR submitted to EDS on 03/24/15     PASRR number received on 03/24/15     Existing PASRR number confirmed on       FL2 transmitted to all facilities in geographic area requested by pt/family on 03/24/15     FL2 transmitted to all facilities within larger geographic area on       Patient informed that his/her managed care company has contracts with or will negotiate with certain facilities, including the following:            Patient/family informed of bed offers received.  Patient chooses bed at       Physician recommends and patient chooses bed at      Patient to be transferred to   on  .  Patient to be transferred to facility by       Patient family notified on   of transfer.  Name of family member notified:        PHYSICIAN       Additional Comment:    _______________________________________________ Salome Arnt, [place] 03/24/2015, 4:17  PM [phone]

## 2015-03-24 NOTE — Clinical Social Work Note (Signed)
Clinical Social Work Assessment  Patient Details  Name: Katie Fuentes MRN: [id number] Date of Birth: [birth year]  Date of referral:  03/24/15               Reason for consult:  Facility Placement                Permission sought to share information with:  Other Permission granted to share information::  Yes, Verbal Permission Granted  Name::     Data processing manager::     Relationship::  spouse  Contact Information:     Housing/Transportation Living arrangements for the past 2 months:  Single Family Home Source of Information:  Patient, Spouse Patient Interpreter Needed:  None Criminal Activity/Legal Involvement Pertinent to Current Situation/Hospitalization:  No - Comment as needed Significant Relationships:  Adult Children, Spouse Lives with:  Spouse Do you feel safe going back to the place where you live?  Yes (Will probably require short term rehab prior to return home) Need for family participation in patient care:  Yes (Comment)  Care giving concerns:  Pt lives with husband who could provide some assistance.    Social Worker assessment / plan:  CSW met with pt and pt's husband at bedside. Pt alert and oriented. She reports that they have a son and daughter that live nearby and are involved and supportive. Pt indicates she came to ED due to shortness of breath. Admitted with acute respiratory failure. Pt states that she does well at home. She said she has been to chair this morning, but is definitely weaker than normal. She shared that MD discussed CIR vs SNF with her. Pt is very open to CIR if needed, and CSW discussed possibility of SNF. Pt and husband report they have never been through this before and don't know where to begin. Support provided. They were given SNF list and RN said that pt should be evaluated by PT tomorrow for recommendations. They agreed to SNF bed search in [facility] in case needed. Pt states, "If I have to I will go to skilled nursing facility." Pt and  husband also plan to talk to family/friends who have been to SNF before.   Employment status:  Retired Nurse, adult PT Recommendations:  Not assessed at this time Campus / Referral to community resources:  [place]  Patient/Family's Response to care:  Agreeable to begin SNF search in [employer], but hopeful for CIR recommendation.   Patient/Family's Understanding of and Emotional Response to Diagnosis, Current Treatment, and Prognosis:  Pt appears understanding of treatment. She is hopeful that rehab will quickly get her back to baseline once d/c from hospital. When discussing feelings regarding SNF, pt states, "You have to do what you have to do."   Emotional Assessment Appearance:  Appears stated age Attitude/Demeanor/Rapport:  Other (cooperative, open to talking to CSW) Affect (typically observed):  Stable, Pleasant Orientation:  Oriented to Self, Oriented to Place, Oriented to  Time, Oriented to Situation Alcohol / Substance use:  Not Applicable Psych involvement (Current and /or in the community):  No (Comment)  Discharge Needs  Concerns to be addressed:  Discharge Planning Concerns Readmission within the last 30 days:  Yes Current discharge risk:  Physical Impairment Barriers to Discharge:  Continued Medical Work up   Salome Arnt, [place] 03/24/2015, 4:20 PM [phone]

## 2015-03-24 NOTE — Progress Notes (Signed)
Advanced Heart Failure Rounding Note   Subjective:    Admitted 4/23 with recurrent HF. Had several episodes of flush pulmonary edema in setting of severe HTN.    Underwent R/L cath on 4/25 with showed 60% LM (confirmed by IVUS) and 95% L renal artery stenosis. (R ok)  RA = 2 RV = 35/1/3 PA = 31/11 (21) PCW = 12 Fick cardiac output/index = 3.5/2.8 PVR = 2.5 WU SVR = 2121  FA sat = 94% PA sat = 55%, 57%  Yesterday recurrent respiratory distress. IV lasix restarted. Feeling better now. Creatinine improving. SBP 120-130s. CVP 9-10  Objective:   Weight Range:  Vital Signs:   Temp:  [97.6 F (36.4 C)-97.8 F (36.6 C)] 97.8 F (36.6 C) (05/01 1125) Pulse Rate:  [95-109] 95 (05/01 1200) Resp:  [18-28] 19 (05/01 1200) BP: (115-143)/(44-63) 128/50 mmHg (05/01 1200) SpO2:  [92 %-99 %] 98 % (05/01 1200) FiO2 (%):  [40 %] 40 % (04/30 2000) Weight:  [61.5 kg (135 lb 9.3 oz)-67.813 kg (149 lb 8 oz)] 67.813 kg (149 lb 8 oz) (05/01 0800) Last BM Date: 03/21/15  Weight change: Filed Weights   03/23/15 0500 03/24/15 0400 03/24/15 0800  Weight: 70.262 kg (154 lb 14.4 oz) 61.5 kg (135 lb 9.3 oz) 67.813 kg (149 lb 8 oz)    Intake/Output:   Intake/Output Summary (Last 24 hours) at 03/24/15 1235 Last data filed at 03/24/15 1200  Gross per 24 hour  Intake    722 ml  Output   2115 ml  Net  -1393 ml     Physical Exam: General:  ElderlySitting in chair HEENT: normal Neck: supple. JVP 6 Carotids 2+ bilat; no bruits. No lymphadenopathy or thryomegaly appreciated. Cor: PMI nondisplaced. Regular rate & rhythm. No rubs, gallops or murmurs. Lungs: clear Abdomen: soft, nontender, nondistended. No hepatosplenomegaly. No bruits or masses. Good bowel sounds. Extremities: no cyanosis, clubbing, rash, trace edema Neuro: alert & orientedx3, cranial nerves grossly intact. moves all 4 extremities w/o difficulty. Affect pleasant  Telemetry: Sinus 90-100  Labs: Basic Metabolic Panel:  Recent  Labs Lab 03/20/15 0455 03/21/15 0335 03/22/15 0500 03/22/15 1130 03/23/15 0429 03/24/15 0452  NA 124* 122* 120*  --  127* 126*  K 4.0 3.5 2.6* 3.5 4.1 3.3*  CL 86* 83* 77*  --  87* 84*  CO2 25 25 27   --  28 28  GLUCOSE 159* 194* 308*  --  185* 199*  BUN 51* 51* 54*  --  56* 66*  CREATININE 3.11* 3.20* 3.30*  --  3.07* 2.93*  CALCIUM 8.0* 8.4 8.2*  --  9.0 8.8*    Liver Function Tests: No results for input(s): AST, ALT, ALKPHOS, BILITOT, PROT, ALBUMIN in the last 168 hours. No results for input(s): LIPASE, AMYLASE in the last 168 hours. No results for input(s): AMMONIA in the last 168 hours.  CBC:  Recent Labs Lab 03/20/15 0455 03/21/15 0335 03/22/15 0500 03/23/15 0429 03/24/15 0452  WBC 10.7* 11.0* 8.9 10.5 11.4*  HGB 8.6* 8.8* 8.0* 9.0* 8.9*  HCT 26.3* 26.2* 24.0* 27.4* 27.1*  MCV 84.8 83.7 84.2 84.6 85.2  PLT 307 347 333 392 423*    Cardiac Enzymes:  Recent Labs Lab 03/17/15 1600 03/17/15 2001  CKTOTAL 92  --   CKMB 4.0  --   TROPONINI 0.12* 0.12*    BNP: BNP (last 3 results)  Recent Labs  02/14/15 0247 03/16/15 0650  BNP 1346.9* 648.2*    ProBNP (last 3 results)  Recent  Labs  03/01/15 0840  PROBNP 742.0*      Other results:  Imaging: Dg Chest Port 1 View  03/23/2015   CLINICAL DATA:  Shortness of breath.  EXAM: PORTABLE CHEST - 1 VIEW  COMPARISON:  March 17, 2015  FINDINGS: Central catheter tip is in the superior vena cava. No pneumothorax. There is borderline cardiomegaly with interstitial edema and bilateral pleural effusions. There is airspace consolidation in the medial left base. No adenopathy.  IMPRESSION: Congestive heart failure. Consolidation medial left base, stable. Central catheter tip in superior vena cava without pneumothorax.   Electronically Signed   By: Lowella Grip III M.D.   On: 03/23/2015 09:42     Medications:     Scheduled Medications: . amLODipine  10 mg Oral Daily  . antiseptic oral rinse  7 mL Mouth  Rinse q12n4p  . aspirin EC  81 mg Oral Daily  . atorvastatin  10 mg Oral Daily  . calcium citrate  1 tablet Oral Daily  . carvedilol  3.125 mg Oral BID WC  . chlorhexidine  15 mL Mouth Rinse BID  . cholecalciferol  1,000 Units Oral Daily  . docusate sodium  100 mg Oral BID  . folic acid-pyridoxine-cyancobalamin  1 tablet Oral BID  . furosemide  120 mg Intravenous BID  . heparin  5,000 Units Subcutaneous 3 times per day  . hydrALAZINE  100 mg Oral 3 times per day  . insulin aspart  0-5 Units Subcutaneous QHS  . insulin aspart  0-9 Units Subcutaneous TID WC  . insulin detemir  15 Units Subcutaneous Daily  . iron polysaccharides  150 mg Oral BID  . isosorbide mononitrate  30 mg Oral Daily  . latanoprost  1 drop Both Eyes QHS  . linagliptin  5 mg Oral Daily  . potassium chloride  10 mEq Intravenous Q1 Hr x 4  . sodium chloride  10-40 mL Intracatheter Q12H  . sodium chloride  3 mL Intravenous Q12H  . spironolactone  12.5 mg Oral Daily  . torsemide  40 mg Oral BID  . vitamin C  1,000 mg Oral Daily  . vitamin E  400 Units Oral Daily    Infusions: . nitroGLYCERIN Stopped (03/22/15 1300)    PRN Medications: sodium chloride, acetaminophen, ALPRAZolam, alum & mag hydroxide-simeth, hydrALAZINE, nitroGLYCERIN, ondansetron (ZOFRAN) IV, sodium chloride, sodium chloride, zolpidem   Assessment:   1. Acute respiratory failure due flash pulmonary edema 2. Acute on chronic systolic HF, EF 49-17% 3. CKD stage 3 4. DM2 5. LBBB 6. HTN 7. Hypokalemia/hyponatremia 8. Acute on chronic renal failure, now stage 4 9. CAD with 60% LM lesion 10. Unilateral L renal artery stenosis   Plan/Discussion:    Respiratory status improved. Creatinine improving slowly. Will continue IV lasix for today.  Given underlying CKD and severe HTN with episodes of flash pulmonary I think she needs stent of L renal artery. I have reviewed with Dr. Trula Slade who agrees. Although this can be done with minimal contrast  would like to see creatinine a little closer to baseline first.   BP improved. Continue current regimen.  ECG strips reviewed with EP and feel it is sinus and not atrial tach.   No ACE or ARB with renal failure. Keep b-blocker at low dose.   PT to see. Will likely need SNF or CIR prior to going home.    Length of Stay: 8 [name] MD 03/24/2015, 12:35 PM Advanced Heart Failure Team Pager [phone] (M-F; 7a - 4p)  Please contact [facility] for night-coverage after hours (4p -7a ) and weekends on [email or web address]

## 2015-03-24 NOTE — Progress Notes (Signed)
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 amLODipine  10 mg Oral Daily  . antiseptic oral rinse  7 mL Mouth Rinse q12n4p  . aspirin EC  81 mg Oral Daily  . atorvastatin  10 mg Oral Daily  . calcium citrate  1 tablet Oral Daily  . carvedilol  3.125 mg Oral BID WC  . chlorhexidine  15 mL Mouth Rinse BID  . cholecalciferol  1,000 Units Oral Daily  . docusate sodium  100 mg Oral BID  . folic acid-pyridoxine-cyancobalamin  1 tablet Oral BID  . furosemide  120 mg Intravenous BID  . heparin  5,000 Units Subcutaneous 3 times per day  . hydrALAZINE  100 mg Oral 3 times per day  . insulin aspart  0-5 Units Subcutaneous QHS  . insulin aspart  0-9 Units Subcutaneous TID WC  . insulin detemir  15 Units Subcutaneous Daily  . iron polysaccharides  150 mg Oral BID  . isosorbide mononitrate  30 mg Oral Daily  . latanoprost  1 drop Both Eyes QHS  . linagliptin  5 mg Oral Daily  . sodium chloride  10-40 mL Intracatheter Q12H  . sodium chloride  3 mL Intravenous Q12H  . spironolactone  12.5 mg Oral Daily  . torsemide  40 mg Oral BID  . vitamin C  1,000 mg Oral Daily  . vitamin E  400 Units Oral Daily   Continuous Infusions: . nitroGLYCERIN Stopped (03/22/15 1300)   PRN Meds:.sodium chloride, acetaminophen, ALPRAZolam, alum & mag hydroxide-simeth, hydrALAZINE, nitroGLYCERIN, ondansetron (ZOFRAN) IV, sodium chloride, sodium  chloride, zolpidem  Current Labs: reviewed    Physical Exam:  Blood pressure 142/49, pulse 105, temperature 97.6 F (36.4 C), temperature source Oral, resp. rate 24, height 5\' 2"  (1.575 m), weight 67.813 kg (149 lb 8 oz), last menstrual period 07/24/1977, SpO2 93 %. GEN: NAD, off BiPAP ENT:  NCAT EYES: EOMI CV: tachy, regular, no mgr PULM: CTAB, diminished in bases, coarse bs ABD: s/nt/nd, no abd bruits SKIN: No rashes/lesions ZOX:WRUEA LEE  A/P 1. 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 BL SCr 1.4-1.6, suspect chronic disease has multifactorial etiology including DM/HTN/ASCVD/RAS 2. GFR slowly recovering after contrast nephropathy 3. Renal US 01/2015 w/o structural issues 4. Daily weights, Daily Renal Panel, Strict I/Os, Avoid nephrotoxins (NSAIDs, judicious IV Contrast) 2. Hyponatremia, Hypervolemia,  1. Urine osmolality is inappropriately elevated, interestingly serum osmolarity was normal 2. Suspect related to CHF 3. Fluid restrict to 1.2L 4. Continue to monitor 3. Hypokalemia 1. Secondary to loop diuretics, also on spironolactone 2. Replete this morning 03/24/15 4. L RAS secondary to atherosclerotic disease 1. 95% ostial on 03/18/15 angiogram 2. Agree with eventual stent placement but patient is currently not stable based on recurrent episodes of pulmonary edema 3. Would like to have returned to baseline GFR before repeat angiogram 2. Acute on chronic systolic heart failure: Managed by AHF 3. Anemia  Thurmond Butts  Ej Pinson MD 03/24/2015, 8:26 AM   Recent Labs Lab 03/22/15 0500 03/22/15 1130 03/23/15 0429 03/24/15 0452  NA 120*  --  127* 126*  K 2.6* 3.5 4.1 3.3*  CL 77*  --  87* 84*  CO2 27  --  28 28  GLUCOSE 308*  --  185* 199*  BUN 54*  --  56* 66*  CREATININE 3.30*  --  3.07* 2.93*  CALCIUM 8.2*  --  9.0 8.8*    Recent Labs Lab 03/22/15 0500 03/23/15 0429 03/24/15 0452  WBC 8.9 10.5 11.4*  HGB 8.0* 9.0* 8.9*  HCT 24.0* 27.4* 27.1*  MCV 84.2 84.6 85.2  PLT 333 392 423*

## 2015-03-25 LAB — GLUCOSE, CAPILLARY
GLUCOSE-CAPILLARY: 184 mg/dL — AB (ref 70–99)
GLUCOSE-CAPILLARY: 250 mg/dL — AB (ref 70–99)
GLUCOSE-CAPILLARY: 239 mg/dL — AB (ref 70–99)
GLUCOSE-CAPILLARY: 184 mg/dL — AB (ref 70–99)

## 2015-03-25 LAB — BASIC METABOLIC PANEL
Anion gap: 13 (ref 5–15)
BUN: 70 mg/dL — ABNORMAL HIGH (ref 6–20)
CO2: 30 mmol/L (ref 22–32)
Calcium: 8.6 mg/dL — ABNORMAL LOW (ref 8.9–10.3)
Chloride: 85 mmol/L — ABNORMAL LOW (ref 101–111)
Creatinine, Ser: 2.77 mg/dL — ABNORMAL HIGH (ref 0.44–1.00)
GFR calc Af Amer: 18 mL/min — ABNORMAL LOW (ref >60)
GFR calc non Af Amer: 15 mL/min — ABNORMAL LOW (ref >60)
Glucose, Bld: 148 mg/dL — ABNORMAL HIGH (ref 70–99)
Potassium: 3 mmol/L — ABNORMAL LOW (ref 3.5–5.1)
Sodium: 128 mmol/L — ABNORMAL LOW (ref 135–145)

## 2015-03-25 LAB — CBC
HCT: 26.3 % — ABNORMAL LOW (ref 36.0–46.0)
HEMOGLOBIN: 8.8 g/dL — AB (ref 12.0–15.0)
MCH: 28.4 pg (ref 26.0–34.0)
MCHC: 33.5 g/dL (ref 30.0–36.0)
MCV: 84.8 fL (ref 78.0–100.0)
PLATELETS: 430 10*3/uL — AB (ref 150–400)
RBC: 3.1 MIL/uL — ABNORMAL LOW (ref 3.87–5.11)
RDW: 14.9 % (ref 11.5–15.5)
WBC: 10.1 10*3/uL (ref 4.0–10.5)

## 2015-03-25 LAB — CARBOXYHEMOGLOBIN
Carboxyhemoglobin: 1 % (ref 0.5–1.5)
METHEMOGLOBIN: 0.9 % (ref 0.0–1.5)
O2 Saturation: 79.3 %
Total hemoglobin: 8.9 g/dL — ABNORMAL LOW (ref 12.0–16.0)

## 2015-03-25 MED ORDER — TORSEMIDE 20 MG PO TABS
60.0000 mg | ORAL_TABLET | Freq: Two times a day (BID) | ORAL | Status: DC
  Filled 2015-03-25 (×2): qty 3

## 2015-03-25 MED ORDER — POTASSIUM CHLORIDE CRYS ER 10 MEQ PO TBCR
40.0000 meq | EXTENDED_RELEASE_TABLET | Freq: Once | ORAL | Status: AC
  Administered 2015-03-25: 40 meq via ORAL
  Filled 2015-03-25: qty 4

## 2015-03-25 MED ORDER — TORSEMIDE 20 MG PO TABS
40.0000 mg | ORAL_TABLET | Freq: Two times a day (BID) | ORAL | Status: DC
  Administered 2015-03-26 – 2015-03-27 (×3): 40 mg via ORAL
  Filled 2015-03-25 (×5): qty 2

## 2015-03-25 MED ORDER — POTASSIUM CHLORIDE CRYS ER 10 MEQ PO TBCR
40.0000 meq | EXTENDED_RELEASE_TABLET | ORAL | Status: AC
  Administered 2015-03-25 (×2): 40 meq via ORAL
  Filled 2015-03-25 (×2): qty 4

## 2015-03-25 NOTE — Progress Notes (Signed)
Pt's foley catheter was D/C'd today at 1430. Per day shift RN pt had not voided since the foley removal. I asked the pt several times if she needed to void. Pt sat on bedside commode several times but was unsuccessful in voiding. I scanned pt's bladder at 2200 and found 388ml to be in bladder. At 2330 pt stated she felt as if she needed to void; she sat back down on the bedside commode but was still unable to void. Pt said she was starting to feel uncomfortable. Another RN and I In/Out cathed the pt and got 687ml out of bladder. Pt stated she felt better. Will continue to monitor at this time.

## 2015-03-25 NOTE — Progress Notes (Signed)
[facility] Rounding Note  Subjective:  Creatinine continues trend of slow improvement Peaked at 3.3, down to 2.77 today  Good urine output Cards is now adjusting diuretics  05/01 0701 - 05/02 0700 In: 7893 [P.O.:900; I.V.:80; IV Piggyback:324] Out: 2100 [Urine:2100]  Filed Weights   03/24/15 0400 03/24/15 0800 03/25/15 0420  Weight: 61.5 kg (135 lb 9.3 oz) 67.813 kg (149 lb 8 oz) 68.9 kg (151 lb 14.4 oz)    Physical Exam:  Blood pressure 140/52, pulse 95, temperature 97.3 F (36.3 C), temperature source Oral, resp. rate 18, height 5\' 2"  (1.575 m), weight 68.9 kg (151 lb 14.4 oz), last menstrual period 07/24/1977, SpO2 95 %. GEN: NAD, up in the chair CV: Regular, S1S2 No S3  HR 90's PULM: anteriorly clear, diminished in basesbs ABD: s/nt/nd, no abd bruits SKIN: No rashes/lesions Trace edema  Basic Metabolic Panel:  Recent Labs  03/24/15 0452 03/25/15 0412  NA 126* 128*  K 3.3* 3.0*  CL 84* 85*  CO2 28 30  GLUCOSE 199* 148*  BUN 66* 70*  CREATININE 2.93* 2.77*  CALCIUM 8.8* 8.6*    Recent Labs  03/24/15 0452 03/25/15 0412  WBC 11.4* 10.1  HGB 8.9* 8.8*  HCT 27.1* 26.3*  MCV 85.2 84.8  PLT 423* 430*    Scheduled Meds: . amLODipine  10 mg Oral Daily  . antiseptic oral rinse  7 mL Mouth Rinse q12n4p  . aspirin EC  81 mg Oral Daily  . atorvastatin  10 mg Oral Daily  . calcium citrate  1 tablet Oral Daily  . carvedilol  3.125 mg Oral BID WC  . chlorhexidine  15 mL Mouth Rinse BID  . cholecalciferol  1,000 Units Oral Daily  . docusate sodium  100 mg Oral BID  . folic acid-pyridoxine-cyancobalamin  1 tablet Oral BID  . heparin  5,000 Units Subcutaneous 3 times per day  . hydrALAZINE  100 mg Oral 3 times per day  . insulin aspart  0-5 Units Subcutaneous QHS  . insulin aspart  0-9 Units Subcutaneous TID WC  . insulin detemir  15 Units Subcutaneous Daily  . iron polysaccharides  150 mg Oral BID  . isosorbide mononitrate  30 mg Oral Daily  .  latanoprost  1 drop Both Eyes QHS  . linagliptin  5 mg Oral Daily  . potassium chloride  40 mEq Oral Q4H  . sodium chloride  10-40 mL Intracatheter Q12H  . sodium chloride  3 mL Intravenous Q12H  . spironolactone  12.5 mg Oral Daily  . [START ON 03/26/2015] torsemide  40 mg Oral BID  . vitamin C  1,000 mg Oral Daily  . vitamin E  400 Units Oral Daily   Continuous Infusions: . 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 BL SCr 1.4-1.6, suspect chronic disease has multifactorial etiology including DM/HTN/ASCVD/RAS 2. GFR slowly recovering after contrast nephropathy 3. Renal US 01/2015 w/o structural issues 4. Daily weights, Daily Renal Panel, Strict I/Os, Avoid nephrotoxins (NSAIDs, judicious IV Contrast) 2. Hyponatremia, Hypervolemia,  1. Urine osmolality is inappropriately elevated, interestingly serum osmolarity was normal 2. Serum sodium up to 128 today slowly increasing 3. Suspect related to CHF 4. Fluid restricted to  1.2L 5. Continue to monitor 3. Hypokalemia 1. Secondary to loop diuretics, also on spironolactone 2. Cards has ordered repletion 4. L RAS secondary to atherosclerotic disease 1. 95% ostial on 03/18/15 angiogram 2. Agree with eventual stent placement but patient is currently not stable based on recurrent episodes of pulmonary edema 3. Would like to have returned to baseline GFR before repeat angiogram/contrast exposure 2. Acute on chronic systolic heart failure: Managed by AHF 3. Anemia - TSat 8% done back in March. No recent Fe studies. Hb <10. Check iron, replete IV if still; quite low.   Jamal Maes, MD [facility] [phone] Pager 03/25/2015, 11:21 AM

## 2015-03-25 NOTE — Progress Notes (Signed)
Md notified about pt's potassium of 3.0. New orders received. Will continue to monitor. Katie Fuentes

## 2015-03-25 NOTE — Progress Notes (Signed)
CSW (Clinical Education officer, museum) aware of new PT recommendation for home with home health services. Per note, RNCM aware. CSW signing off.  [place], [place]

## 2015-03-25 NOTE — Progress Notes (Addendum)
Advanced Heart Failure Rounding Note   Subjective:    Admitted 4/23 with recurrent HF. Had several episodes of flush pulmonary edema in setting of severe HTN.    Underwent R/L cath on 4/25 with showed 60% LM (confirmed by IVUS) and 95% L renal artery stenosis. (R ok)  RA = 2 RV = 35/1/3 PA = 31/11 (21) PCW = 12 Fick cardiac output/index = 3.5/2.8 PVR = 2.5 WU SVR = 2121  FA sat = 94% PA sat = 55%, 57%  BP improved. Dyspnea resolved. CVP 9. Co-ox ok. Renal function continues to improve slowly.   Objective:   Weight Range:  Vital Signs:   Temp:  [97.3 F (36.3 C)-98.2 F (36.8 C)] 97.3 F (36.3 C) (05/02 0730) Pulse Rate:  [92-104] 104 (05/02 0730) Resp:  [15-34] 18 (05/02 0730) BP: (126-143)/(48-55) 140/52 mmHg (05/02 0730) SpO2:  [93 %-98 %] 94 % (05/02 0730) Weight:  [151 lb 14.4 oz (68.9 kg)] 151 lb 14.4 oz (68.9 kg) (05/02 0420) Last BM Date: 03/21/15  Weight change: Filed Weights   03/24/15 0400 03/24/15 0800 03/25/15 0420  Weight: 135 lb 9.3 oz (61.5 kg) 149 lb 8 oz (67.813 kg) 151 lb 14.4 oz (68.9 kg)    Intake/Output:   Intake/Output Summary (Last 24 hours) at 03/25/15 0911 Last data filed at 03/25/15 0800  Gross per 24 hour  Intake   1226 ml  Output   2280 ml  Net  -1054 ml     Physical Exam: General:  Elderly Sitting in chair HEENT: normal Neck: supple. JVP 9 Carotids 2+ bilat; no bruits. No lymphadenopathy or thryomegaly appreciated. Cor: PMI nondisplaced. Regular rate & rhythm. No rubs, gallops or murmurs. Lungs: clear Abdomen: soft, nontender, nondistended. No hepatosplenomegaly. No bruits or masses. Good bowel sounds. Extremities: no cyanosis, clubbing, rash, trace edema Neuro: alert & orientedx3, cranial nerves grossly intact. moves all 4 extremities w/o difficulty. Affect pleasant  Telemetry: Sinus 100-100  Labs: Basic Metabolic Panel:  Recent Labs Lab 03/21/15 0335 03/22/15 0500 03/22/15 1130 03/23/15 0429 03/24/15 0452  03/25/15 0412  NA 122* 120*  --  127* 126* 128*  K 3.5 2.6* 3.5 4.1 3.3* 3.0*  CL 83* 77*  --  87* 84* 85*  CO2 25 27  --  28 28 30   GLUCOSE 194* 308*  --  185* 199* 148*  BUN 51* 54*  --  56* 66* 70*  CREATININE 3.20* 3.30*  --  3.07* 2.93* 2.77*  CALCIUM 8.4 8.2*  --  9.0 8.8* 8.6*    Liver Function Tests: No results for input(s): AST, ALT, ALKPHOS, BILITOT, PROT, ALBUMIN in the last 168 hours. No results for input(s): LIPASE, AMYLASE in the last 168 hours. No results for input(s): AMMONIA in the last 168 hours.  CBC:  Recent Labs Lab 03/21/15 0335 03/22/15 0500 03/23/15 0429 03/24/15 0452 03/25/15 0412  WBC 11.0* 8.9 10.5 11.4* 10.1  HGB 8.8* 8.0* 9.0* 8.9* 8.8*  HCT 26.2* 24.0* 27.4* 27.1* 26.3*  MCV 83.7 84.2 84.6 85.2 84.8  PLT 347 333 392 423* 430*    Cardiac Enzymes: No results for input(s): CKTOTAL, CKMB, CKMBINDEX, TROPONINI in the last 168 hours.  BNP: BNP (last 3 results)  Recent Labs  02/14/15 0247 03/16/15 0650  BNP 1346.9* 648.2*    ProBNP (last 3 results)  Recent Labs  03/01/15 0840  PROBNP 742.0*      Other results:  Imaging: Dg Chest Port 1 View  03/23/2015   CLINICAL DATA:  Shortness of breath.  EXAM: PORTABLE CHEST - 1 VIEW  COMPARISON:  March 17, 2015  FINDINGS: Central catheter tip is in the superior vena cava. No pneumothorax. There is borderline cardiomegaly with interstitial edema and bilateral pleural effusions. There is airspace consolidation in the medial left base. No adenopathy.  IMPRESSION: Congestive heart failure. Consolidation medial left base, stable. Central catheter tip in superior vena cava without pneumothorax.   Electronically Signed   By: Lowella Grip III M.D.   On: 03/23/2015 09:42     Medications:     Scheduled Medications: . amLODipine  10 mg Oral Daily  . antiseptic oral rinse  7 mL Mouth Rinse q12n4p  . aspirin EC  81 mg Oral Daily  . atorvastatin  10 mg Oral Daily  . calcium citrate  1 tablet  Oral Daily  . carvedilol  3.125 mg Oral BID WC  . chlorhexidine  15 mL Mouth Rinse BID  . cholecalciferol  1,000 Units Oral Daily  . docusate sodium  100 mg Oral BID  . folic acid-pyridoxine-cyancobalamin  1 tablet Oral BID  . furosemide  120 mg Intravenous BID  . heparin  5,000 Units Subcutaneous 3 times per day  . hydrALAZINE  100 mg Oral 3 times per day  . insulin aspart  0-5 Units Subcutaneous QHS  . insulin aspart  0-9 Units Subcutaneous TID WC  . insulin detemir  15 Units Subcutaneous Daily  . iron polysaccharides  150 mg Oral BID  . isosorbide mononitrate  30 mg Oral Daily  . latanoprost  1 drop Both Eyes QHS  . linagliptin  5 mg Oral Daily  . potassium chloride  40 mEq Oral Q4H  . sodium chloride  10-40 mL Intracatheter Q12H  . sodium chloride  3 mL Intravenous Q12H  . spironolactone  12.5 mg Oral Daily  . torsemide  40 mg Oral BID  . vitamin C  1,000 mg Oral Daily  . vitamin E  400 Units Oral Daily    Infusions: . nitroGLYCERIN Stopped (03/22/15 1300)    PRN Medications: sodium chloride, acetaminophen, ALPRAZolam, alum & mag hydroxide-simeth, hydrALAZINE, nitroGLYCERIN, ondansetron (ZOFRAN) IV, sodium chloride, sodium chloride, zolpidem   Assessment:   1. Acute respiratory failure due flash pulmonary edema 2. Acute on chronic systolic HF, EF 69-67% 3. CKD stage 3 4. DM2 5. LBBB 6. HTN 7. Hypokalemia/hyponatremia 8. Acute on chronic renal failure, now stage 4 9. CAD with 60% LM lesion 10. Unilateral L renal artery stenosis 11. Urinary retention   Plan/Discussion:    Respiratory status stable. Creatinine continues to improve. Volume status ok. Change to po diuretics. Continue to follow CVP. Appreciate Renal following.   Given underlying CKD and severe HTN with episodes of flash pulmonary I think she needs stent of L renal artery. I have reviewed with Dr. Trula Slade who agrees. Although this can be done with minimal contrast would like to see creatinine a little  closer to baseline first - hopefully by end of week or early next week.   BP stable. Continue current regimen.   No ACE or ARB with renal failure. Keep b-blocker at low dose.   PT following. May need SNF.   Davarion Cuffee,MD 10:11 AM Advanced Heart Failure Team Pager [phone] (M-F; 7a - 4p)  Please contact [facility] for night-coverage after hours (4p -7a ) and weekends on [email or web address]

## 2015-03-25 NOTE — Evaluation (Addendum)
Physical Therapy Evaluation Patient Details Name: Katie Fuentes MRN: [id number] DOB: 27-Aug-1934 Today's Date: 03/25/2015   History of Present Illness  Admitted 4/23 with recurrent HF. Had several episodes of flush pulmonary edema in setting of severe HTN  Clinical Impression  Pt very pleasant with slight decrease in processing from baseline per pt and spouse. Pt fatigued with activity with increased weakness from baseline. Pt and spouse report 24hr care available at home and preference for home vs subacute rehab. Pt will benefit from acute therapy to maximize mobility, function, gait and activity tolerance to decrease burden of care and return pt to PLOF. Recommend daily ambulation with nursing staff.     Follow Up Recommendations Home health PT;Supervision for mobility/OOB    Equipment Recommendations  Rolling walker with 5" wheels;3in1 (PT) (rollator)    Recommendations for Other Services OT consult     Precautions / Restrictions Precautions Precautions: Fall      Mobility  Bed Mobility               General bed mobility comments: in chair on arrival  Transfers Overall transfer level: Needs assistance   Transfers: Sit to/from Stand Sit to Stand: Min guard         General transfer comment: cues for hand placement with assist for anterior translation and cues for safety with descent  Ambulation/Gait Ambulation/Gait assistance: Min assist Ambulation Distance (Feet): 90 Feet Assistive device: Rolling walker (2 wheeled) Gait Pattern/deviations: Step-through pattern;Decreased stride length;Trunk flexed   Gait velocity interpretation: Below normal speed for age/gender General Gait Details: cues and assist to control and direct RW   Stairs            Wheelchair Mobility    Modified Rankin (Stroke Patients Only)       Balance Overall balance assessment: Needs assistance   Sitting balance-Leahy Scale: Good       Standing balance-Leahy Scale:  Poor                               Pertinent Vitals/Pain Pain Assessment: 0-10 Pain Score: 3  Pain Location: bil feet neuropathy Pain Descriptors / Indicators: Burning Pain Intervention(s): Limited activity within patient's tolerance;Repositioned  sats 95% on 2L with ambulation and 99% at rest HR 96-99 BP 140/53    Home Living Family/patient expects to be discharged to:: Private residence Living Arrangements: Spouse/significant other Available Help at Discharge: Family;Available 24 hours/day Type of Home: House Home Access: Stairs to enter Entrance Stairs-Rails: Right Entrance Stairs-Number of Steps: 5 Home Layout: Multi-level Home Equipment: Cane - single point      Prior Function Level of Independence: Independent               Hand Dominance        Extremity/Trunk Assessment   Upper Extremity Assessment: Generalized weakness           Lower Extremity Assessment: Generalized weakness      Cervical / Trunk Assessment: Normal  Communication   Communication: No difficulties  Cognition Arousal/Alertness: Awake/alert Behavior During Therapy: WFL for tasks assessed/performed Overall Cognitive Status: Within Functional Limits for tasks assessed                      General Comments      Exercises        Assessment/Plan    PT Assessment Patient needs continued PT services  PT Diagnosis Difficulty walking;Generalized weakness  PT Problem List Decreased strength;Decreased activity tolerance;Decreased balance;Decreased mobility;Cardiopulmonary status limiting activity;Decreased knowledge of use of DME  PT Treatment Interventions Gait training;Stair training;Functional mobility training;Therapeutic activities;Therapeutic exercise;Balance training;Patient/family education;DME instruction   PT Goals (Current goals can be found in the Care Plan section) Acute Rehab PT Goals Patient Stated Goal: return home to quilt and see  grandchildren PT Goal Formulation: With patient/family Time For Goal Achievement: 04/08/15 Potential to Achieve Goals: Good    Frequency Min 3X/week   Barriers to discharge        Co-evaluation               End of Session Equipment Utilized During Treatment: Oxygen Activity Tolerance: Patient tolerated treatment well Patient left: in chair;with call bell/phone within reach;with family/visitor present Nurse Communication: Mobility status         Time: 4619-0122 PT Time Calculation (min) (ACUTE ONLY): 20 min   Charges:   PT Evaluation $Initial PT Evaluation Tier I: 1 Procedure     PT G CodesMelford Aase 03/25/2015, 10:41 AM  Elwyn Reach, [place]

## 2015-03-26 DIAGNOSIS — I701 Atherosclerosis of renal artery: Secondary | ICD-10-CM | POA: Insufficient documentation

## 2015-03-26 DIAGNOSIS — I161 Hypertensive emergency: Secondary | ICD-10-CM | POA: Insufficient documentation

## 2015-03-26 LAB — RENAL FUNCTION PANEL
Albumin: 3.2 g/dL — ABNORMAL LOW (ref 3.5–5.0)
Anion gap: 15 (ref 5–15)
BUN: 69 mg/dL — ABNORMAL HIGH (ref 6–20)
CALCIUM: 8.7 mg/dL — AB (ref 8.9–10.3)
CO2: 27 mmol/L (ref 22–32)
Chloride: 86 mmol/L — ABNORMAL LOW (ref 101–111)
Creatinine, Ser: 2.54 mg/dL — ABNORMAL HIGH (ref 0.44–1.00)
GFR calc Af Amer: 20 mL/min — ABNORMAL LOW (ref >60)
GFR, EST NON AFRICAN AMERICAN: 17 mL/min — AB (ref >60)
Glucose, Bld: 193 mg/dL — ABNORMAL HIGH (ref 70–99)
Phosphorus: 3.9 mg/dL (ref 2.5–4.6)
Potassium: 4 mmol/L (ref 3.5–5.1)
Sodium: 128 mmol/L — ABNORMAL LOW (ref 135–145)

## 2015-03-26 LAB — CBC
HCT: 28.3 % — ABNORMAL LOW (ref 36.0–46.0)
Hemoglobin: 9.3 g/dL — ABNORMAL LOW (ref 12.0–15.0)
MCH: 28.2 pg (ref 26.0–34.0)
MCHC: 32.9 g/dL (ref 30.0–36.0)
MCV: 85.8 fL (ref 78.0–100.0)
Platelets: 496 10*3/uL — ABNORMAL HIGH (ref 150–400)
RBC: 3.3 MIL/uL — AB (ref 3.87–5.11)
RDW: 14.9 % (ref 11.5–15.5)
WBC: 10.1 10*3/uL (ref 4.0–10.5)

## 2015-03-26 LAB — GLUCOSE, CAPILLARY
Glucose-Capillary: 220 mg/dL — ABNORMAL HIGH (ref 70–99)
Glucose-Capillary: 249 mg/dL — ABNORMAL HIGH (ref 70–99)
Glucose-Capillary: 211 mg/dL — ABNORMAL HIGH (ref 70–99)
Glucose-Capillary: 160 mg/dL — ABNORMAL HIGH (ref 70–99)

## 2015-03-26 LAB — IRON AND TIBC
Iron: 29 ug/dL (ref 28–170)
Saturation Ratios: 9 % — ABNORMAL LOW (ref 10.4–31.8)
TIBC: 335 ug/dL (ref 250–450)
UIBC: 306 ug/dL

## 2015-03-26 LAB — CARBOXYHEMOGLOBIN
CARBOXYHEMOGLOBIN: 0.9 % (ref 0.5–1.5)
Methemoglobin: 1.1 % (ref 0.0–1.5)
O2 Saturation: 73.7 %
TOTAL HEMOGLOBIN: 9.4 g/dL — AB (ref 12.0–16.0)

## 2015-03-26 MED ORDER — FERUMOXYTOL INJECTION 510 MG/17 ML
510.0000 mg | Freq: Once | INTRAVENOUS | Status: AC
  Administered 2015-03-26: 510 mg via INTRAVENOUS
  Filled 2015-03-26: qty 17

## 2015-03-26 MED ORDER — DOXAZOSIN MESYLATE 1 MG PO TABS
1.0000 mg | ORAL_TABLET | Freq: Every day | ORAL | Status: DC
  Administered 2015-03-26 – 2015-03-30 (×5): 1 mg via ORAL
  Filled 2015-03-26 (×5): qty 1

## 2015-03-26 MED ORDER — ISOSORBIDE MONONITRATE ER 30 MG PO TB24
30.0000 mg | ORAL_TABLET | Freq: Two times a day (BID) | ORAL | Status: DC
  Administered 2015-03-26 – 2015-03-30 (×9): 30 mg via ORAL
  Filled 2015-03-26 (×10): qty 1

## 2015-03-26 MED ORDER — MAGNESIUM HYDROXIDE 400 MG/5ML PO SUSP
30.0000 mL | Freq: Every day | ORAL | Status: DC | PRN
  Administered 2015-03-26: 30 mL via ORAL
  Filled 2015-03-26: qty 30

## 2015-03-26 MED ORDER — TAMSULOSIN HCL 0.4 MG PO CAPS
0.4000 mg | ORAL_CAPSULE | Freq: Every day | ORAL | Status: AC
  Administered 2015-03-26: 0.4 mg via ORAL
  Filled 2015-03-26: qty 1

## 2015-03-26 NOTE — Progress Notes (Signed)
Inpatient Diabetes Program Recommendations  AACE/ADA: New Consensus Statement on Inpatient Glycemic Control (2013)  Target Ranges:  Prepandial:   less than 140 mg/dL      Peak postprandial:   less than 180 mg/dL (1-2 hours)      Critically ill patients:  140 - 180 mg/dL   Results for ANGALENA, COUSINEAU (MRN [id number]) as of 03/26/2015 09:54  Ref. Range 03/25/2015 07:43 03/25/2015 11:39 03/25/2015 16:20 03/25/2015 21:45 03/26/2015 08:26  Glucose-Capillary Latest Ref Range: 70-99 mg/dL 184 (H) 250 (H) 239 (H) 184 (H) 220 (H)   Current orders for Inpatient glycemic control: Levemir 15 units daily, Novolog 0-9 units TID with meals, Novolog 0-5 units HS, Tradjenta 5 mg daily  Inpatient Diabetes Program Recommendations Insulin - Basal: Please consider increasing Levemir to 17 units daily. Correction (SSI): Please consider increasing Novolog correction to moderate scale.  Thanks, Barnie Alderman, RN, MSN, CCRN, CDE Diabetes Coordinator Inpatient Diabetes Program [phone] (Team Pager from [place] to [place]) [phone] ([place] office) [phone] [facility] office)

## 2015-03-26 NOTE — Progress Notes (Signed)
Advanced Heart Failure Rounding Note   Subjective:    Admitted 4/23 with recurrent HF. Had several episodes of flush pulmonary edema in setting of severe HTN.    Underwent R/L cath on 4/25 with showed 60% LM (confirmed by IVUS) and 95% L renal artery stenosis. (R ok)  RA = 2 RV = 35/1/3 PA = 31/11 (21) PCW = 12 Fick cardiac output/index = 3.5/2.8 PVR = 2.5 WU SVR = 2121  FA sat = 94% PA sat = 55%, 57%  Now on po diuretics. Weight up a pound. Renal function continues to improve. Had recurrent urinary retention last night with 600cc in her bladder and had I/O cath. This am was able to void on her own.  Co-ox 74%. Iron sat low. CVP 7. SBP back up 130-160.  Objective:   Weight Range:  Vital Signs:   Temp:  [97.3 F (36.3 C)-97.8 F (36.6 C)] 97.8 F (36.6 C) (05/03 1218) Pulse Rate:  [94-104] 95 (05/03 1218) Resp:  [16-29] 23 (05/03 1218) BP: (117-161)/(35-59) 137/54 mmHg (05/03 1218) SpO2:  [92 %-99 %] 95 % (05/03 1218) Weight:  [152 lb 14.4 oz (69.355 kg)] 152 lb 14.4 oz (69.355 kg) (05/03 0400) Last BM Date: 03/21/15  Weight change: Filed Weights   03/24/15 0800 03/25/15 0420 03/26/15 0400  Weight: 149 lb 8 oz (67.813 kg) 151 lb 14.4 oz (68.9 kg) 152 lb 14.4 oz (69.355 kg)    Intake/Output:   Intake/Output Summary (Last 24 hours) at 03/26/15 1522 Last data filed at 03/26/15 1304  Gross per 24 hour  Intake    777 ml  Output   1075 ml  Net   -298 ml     Physical Exam: General:  Elderly Sitting in chair HEENT: normal Neck: supple. JVP 5 Carotids 2+ bilat; no bruits. No lymphadenopathy or thryomegaly appreciated. Cor: PMI nondisplaced. Regular rate & rhythm. No rubs, gallops or murmurs. Lungs: clear Abdomen: soft, nontender, nondistended. No hepatosplenomegaly. No bruits or masses. Good bowel sounds. Extremities: no cyanosis, clubbing, rash, trace edema Neuro: alert & orientedx3, cranial nerves grossly intact. moves all 4 extremities w/o difficulty. Affect  pleasant  Telemetry: Sinus 90  Labs: Basic Metabolic Panel:  Recent Labs Lab 03/22/15 0500 03/22/15 1130 03/23/15 0429 03/24/15 0452 03/25/15 0412 03/26/15 0500  NA 120*  --  127* 126* 128* 128*  K 2.6* 3.5 4.1 3.3* 3.0* 4.0  CL 77*  --  87* 84* 85* 86*  CO2 27  --  28 28 30 27   GLUCOSE 308*  --  185* 199* 148* 193*  BUN 54*  --  56* 66* 70* 69*  CREATININE 3.30*  --  3.07* 2.93* 2.77* 2.54*  CALCIUM 8.2*  --  9.0 8.8* 8.6* 8.7*  PHOS  --   --   --   --   --  3.9    Liver Function Tests:  Recent Labs Lab 03/26/15 0500  ALBUMIN 3.2*   No results for input(s): LIPASE, AMYLASE in the last 168 hours. No results for input(s): AMMONIA in the last 168 hours.  CBC:  Recent Labs Lab 03/22/15 0500 03/23/15 0429 03/24/15 0452 03/25/15 0412 03/26/15 0500  WBC 8.9 10.5 11.4* 10.1 10.1  HGB 8.0* 9.0* 8.9* 8.8* 9.3*  HCT 24.0* 27.4* 27.1* 26.3* 28.3*  MCV 84.2 84.6 85.2 84.8 85.8  PLT 333 392 423* 430* 496*    Cardiac Enzymes: No results for input(s): CKTOTAL, CKMB, CKMBINDEX, TROPONINI in the last 168 hours.  BNP: BNP (last 3  results)  Recent Labs  02/14/15 0247 03/16/15 0650  BNP 1346.9* 648.2*    ProBNP (last 3 results)  Recent Labs  03/01/15 0840  PROBNP 742.0*      Other results:  Imaging: No results found.   Medications:     Scheduled Medications: . amLODipine  10 mg Oral Daily  . antiseptic oral rinse  7 mL Mouth Rinse q12n4p  . aspirin EC  81 mg Oral Daily  . atorvastatin  10 mg Oral Daily  . calcium citrate  1 tablet Oral Daily  . carvedilol  3.125 mg Oral BID WC  . chlorhexidine  15 mL Mouth Rinse BID  . cholecalciferol  1,000 Units Oral Daily  . docusate sodium  100 mg Oral BID  . doxazosin  1 mg Oral Daily  . folic acid-pyridoxine-cyancobalamin  1 tablet Oral BID  . heparin  5,000 Units Subcutaneous 3 times per day  . hydrALAZINE  100 mg Oral 3 times per day  . insulin aspart  0-5 Units Subcutaneous QHS  . insulin aspart   0-9 Units Subcutaneous TID WC  . insulin detemir  15 Units Subcutaneous Daily  . iron polysaccharides  150 mg Oral BID  . isosorbide mononitrate  30 mg Oral BID  . latanoprost  1 drop Both Eyes QHS  . linagliptin  5 mg Oral Daily  . sodium chloride  10-40 mL Intracatheter Q12H  . sodium chloride  3 mL Intravenous Q12H  . spironolactone  12.5 mg Oral Daily  . torsemide  40 mg Oral BID  . vitamin C  1,000 mg Oral Daily  . vitamin E  400 Units Oral Daily    Infusions: . nitroGLYCERIN Stopped (03/22/15 1300)    PRN Medications: sodium chloride, acetaminophen, ALPRAZolam, alum & mag hydroxide-simeth, hydrALAZINE, nitroGLYCERIN, ondansetron (ZOFRAN) IV, sodium chloride, sodium chloride, zolpidem   Assessment:   1. Acute respiratory failure due flash pulmonary edema 2. Acute on chronic systolic HF, EF 46-50% 3. CKD stage 3 4. DM2 5. LBBB 6. HTN 7. Hypokalemia/hyponatremia 8. Acute on chronic renal failure, now stage 4 9. CAD with 60% LM lesion 10. Unilateral L renal artery stenosis 11. Urinary retention   Plan/Discussion:    Respiratory status stable. Creatinine continues to improve. Volume status ok. Continue torsemide 40 bid. Follow CVP. Co-ox good.   Given underlying CKD and severe HTN with episodes of flash pulmonary I think she needs stent of L renal artery. I have reviewed with Dr. Trula Slade who agrees. Although this can be done with minimal contrast would like to see creatinine a little closer to baseline first - hopefully by end of week or early next week. I discussed with Dr. Trula Slade again today.    BP is climbing back up. Will add doxazosin to help with BP and also help reduce bladder neck tone.   I spoke with Dr. Louis Meckel in Urology about urinary retention. Has suggested alpha-blocker + bladder training. Will do bladder scan every 6 hours if > 400cc will do in/out cath. If fails this regimen, will need indwelling Foley.   C/o swallowing difficulty. Will ask speech to  see.   Iron sats low. Renal following and will likely get [facility] today.   No ACE or ARB with renal failure. Keep b-blocker at low dose.   PT following.  Bensimhon, Daniel,MD 3:22 PM Advanced Heart Failure Team Pager [phone] (M-F; 7a - 4p)  Please contact [facility] for night-coverage after hours (4p -7a ) and weekends on [email or web address]

## 2015-03-26 NOTE — Progress Notes (Signed)
[facility] Rounding Note  Subjective:  Issues with urinary retention requiring i/o cath Creatinine continues to fall slowly Continues to have good UOP Fe studies returned with very low tsat and Feraheme ordered Says her RA procedure will likely be first of the week  05/02 0701 - 05/03 0700 In: 762 [P.O.:700; IV Piggyback:62] Out: 1960 [[id number]]  Filed Weights   03/24/15 0800 03/25/15 0420 03/26/15 0400  Weight: 67.813 kg (149 lb 8 oz) 68.9 kg (151 lb 14.4 oz) 69.355 kg (152 lb 14.4 oz)    Physical Exam:  Blood pressure 140/52, pulse 95, temperature 97.3 F (36.3 C), temperature source Oral, resp. rate 18, height 5\' 2"  (1.575 m), weight 68.9 kg (151 lb 14.4 oz), last menstrual period 07/24/1977, SpO2 95 %. GEN: NAD, up in the chair CV: Regular, S1S2 No S3  HR 90's PULM: anteriorly clear, posteriorly just diminished a bit ABD: Protuberant, soft, NT. Bladder percussible about a hands breath above pubic bone SKIN: No rashes/lesions Trace if any edema of LE's  Basic Metabolic Panel:  Recent Labs  03/25/15 0412 03/26/15 0500  NA 128* 128*  K 3.0* 4.0  CL 85* 86*  CO2 30 27  GLUCOSE 148* 193*  BUN 70* 69*  CREATININE 2.77* 2.54*  CALCIUM 8.6* 8.7*  PHOS  --  3.9    Recent Labs  03/25/15 0412 03/26/15 0500  WBC 10.1 10.1  HGB 8.8* 9.3*  HCT 26.3* 28.3*  MCV 84.8 85.8  PLT 430* 496*  Results for Katie Fuentes, Katie Fuentes (MRN [id number]) as of 03/26/2015 11:42  Ref. Range 03/26/2015 05:00  Iron Latest Ref Range: 28-170 ug/dL 29  UIBC Latest Units: ug/dL 306  TIBC Latest Ref Range: 250-450 ug/dL 335  Saturation Ratios Latest Ref Range: 10.4-31.8 % 9 (L)    Scheduled Meds: . amLODipine  10 mg Oral Daily  . antiseptic oral rinse  7 mL Mouth Rinse q12n4p  . aspirin EC  81 mg Oral Daily  . atorvastatin  10 mg Oral Daily  . calcium citrate  1 tablet Oral Daily  . carvedilol  3.125 mg Oral BID WC  . chlorhexidine  15 mL Mouth Rinse BID  . cholecalciferol   1,000 Units Oral Daily  . docusate sodium  100 mg Oral BID  . doxazosin  1 mg Oral Daily  . ferumoxytol  510 mg Intravenous Once  . folic acid-pyridoxine-cyancobalamin  1 tablet Oral BID  . heparin  5,000 Units Subcutaneous 3 times per day  . hydrALAZINE  100 mg Oral 3 times per day  . insulin aspart  0-5 Units Subcutaneous QHS  . insulin aspart  0-9 Units Subcutaneous TID WC  . insulin detemir  15 Units Subcutaneous Daily  . iron polysaccharides  150 mg Oral BID  . isosorbide mononitrate  30 mg Oral BID  . latanoprost  1 drop Both Eyes QHS  . linagliptin  5 mg Oral Daily  . sodium chloride  10-40 mL Intracatheter Q12H  . sodium chloride  3 mL Intravenous Q12H  . spironolactone  12.5 mg Oral Daily  . torsemide  40 mg Oral BID  . vitamin C  1,000 mg Oral Daily  . vitamin E  400 Units Oral Daily   Continuous Infusions: . 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 BL SCr 1.4-1.6, suspect chronic disease has multifactorial etiology including DM/HTN/ASCVD/RAS 2. GFR slowly recovering after contrast nephropathy - creatinine continues to slowly fall 3. Renal US 01/2015 w/o structural issues 4. Daily weights, Daily Renal Panel, Strict I/Os, Avoid nephrotoxins (NSAIDs, judicious IV Contrast) 2. Hyponatremia, Hypervolemia,  1. Urine osmolality was inappropriately elevated, interestingly serum osmolarity was normal 2. Serum sodium stuck around 128 3. Suspect related to CHF 4. Fluid restricted to 1.2L 5. Continuing to monitor 3. Hypokalemia 1. Secondary to loop diuretics, also on spironolactone 2. Cards has repleted - up to 4 today 4. L RAS secondary to  atherosclerotic disease 1. 95% ostial on 03/18/15 angiogram 2. For eventual stent placement  3. Would like to have returned to baseline GFR before repeat angiogram/contrast exposure - looks like may be next week 2. Acute on chronic systolic heart failure: Managed by AHF 3. Anemia - TSat 8% done back in March. Repeat done today tsat of 9%. Dose of Feraheme 510 IV ordered for today.    Jamal Maes, MD [facility] [phone] Pager 03/26/2015, 11:31 AM

## 2015-03-26 NOTE — Evaluation (Signed)
Clinical/Bedside Swallow Evaluation Patient Details  Name: HALLI EQUIHUA MRN: [id number] Date of Birth: 18-Sep-1934  Today's Date: 03/26/2015 Time: SLP Start Time (ACUTE ONLY): 1210 SLP Stop Time (ACUTE ONLY): 1218 SLP Time Calculation (min) (ACUTE ONLY): 8 min  Past Medical History:  Past Medical History  Diagnosis Date  . Atrophic vaginitis   . Yeast infection   . Glaucoma   . Cataracts, bilateral   . Type 2 diabetes, controlled, with renal manifestation 01/2000  . Hyperlipidemia   . Hypertension   . Congestive heart failure   . Cardiomyopathy   . LBBB (left bundle branch block)   . Chronic renal disease, stage III    Past Surgical History:  Past Surgical History  Procedure Laterality Date  . Vaginal delivery      x3  . Colonoscopy  05/2003  . Finger surgery    . Cataract extraction w/ intraocular lens implant Right 2011    1 year later the left side done  . Colonoscopy  06/2008    Dr. Lajoyce Corners recheck in 5 years  . Total vaginal hysterectomy  1978    for endometriosis  . Appendectomy  1950  . Squamous cell carcinoma excision  6/15    Nose  . Left and right heart catheterization with coronary angiogram N/A 03/18/2015    Procedure: LEFT AND RIGHT HEART CATHETERIZATION WITH CORONARY ANGIOGRAM;  Surgeon: Jolaine Artist, MD;  Location: [facility] CATH LAB;  Service: Cardiovascular;  Laterality: N/A;   HPI:  Admitted 4/23 with recurrent HF. Had several episodes of flash pulmonary edema in setting of severe HTN. In notes MD writes the pt reprots difficulty swallowing. No prior SLP report in chart   Assessment / Plan / Recommendation Clinical Impression  Pt presents with normal swallow function. Pt denies any difficulty with mastication or swallowing foods or liquids. Primary complaint is difficulty orally transiting pills whole despite use of puree or liquids. Offered basic strategies to attempt. No SLP f/u needed, will sign off.     Aspiration Risk  Mild    Diet  Recommendation  (regular/thin)   Medication Administration: Whole meds with liquid (try a chin tuck, crush if needed)    Other  Recommendations Oral Care Recommendations: Patient independent with oral care   Follow Up Recommendations       Frequency and Duration        Pertinent Vitals/Pain NA    SLP Swallow Goals     Swallow Study Prior Functional Status       General Other Pertinent Information: Admitted 4/23 with recurrent HF. Had several episodes of flash pulmonary edema in setting of severe HTN. In notes MD writes the pt reprots difficulty swallowing. No prior SLP report in chart Type of Study: Bedside swallow evaluation Previous Swallow Assessment: none Diet Prior to this Study: Regular;Thin liquids Temperature Spikes Noted: No Respiratory Status: Room air History of Recent Intubation: No Behavior/Cognition: Alert;Cooperative;Pleasant mood Oral Cavity - Dentition: Adequate natural dentition/normal for age Self-Feeding Abilities: Able to feed self Patient Positioning: Upright in chair/Tumbleform Baseline Vocal Quality: Normal Volitional Cough: Strong Volitional Swallow: Able to elicit    Oral/Motor/Sensory Function Overall Oral Motor/Sensory Function: Appears within functional limits for tasks assessed   Ice Chips     Thin Liquid Thin Liquid: Within functional limits    Nectar Thick Nectar Thick Liquid: Not tested   Honey Thick Honey Thick Liquid: Not tested   Puree Puree: Within functional limits   Solid   GO    Solid:  Not tested      Herbie Baltimore, MA CCC-SLP [phone]  Lynann Beaver 03/26/2015,1:24 PM

## 2015-03-27 LAB — GLUCOSE, CAPILLARY
GLUCOSE-CAPILLARY: 231 mg/dL — AB (ref 70–99)
Glucose-Capillary: 175 mg/dL — ABNORMAL HIGH (ref 70–99)
Glucose-Capillary: 206 mg/dL — ABNORMAL HIGH (ref 70–99)
Glucose-Capillary: 196 mg/dL — ABNORMAL HIGH (ref 70–99)

## 2015-03-27 LAB — CBC
HCT: 25.4 % — ABNORMAL LOW (ref 36.0–46.0)
Hemoglobin: 8.5 g/dL — ABNORMAL LOW (ref 12.0–15.0)
MCH: 28.2 pg (ref 26.0–34.0)
MCHC: 33.5 g/dL (ref 30.0–36.0)
MCV: 84.4 fL (ref 78.0–100.0)
Platelets: 420 10*3/uL — ABNORMAL HIGH (ref 150–400)
RBC: 3.01 MIL/uL — ABNORMAL LOW (ref 3.87–5.11)
RDW: 14.5 % (ref 11.5–15.5)
WBC: 7.9 10*3/uL (ref 4.0–10.5)

## 2015-03-27 LAB — RENAL FUNCTION PANEL
ALBUMIN: 3.1 g/dL — AB (ref 3.5–5.0)
ANION GAP: 12 (ref 5–15)
BUN: 68 mg/dL — ABNORMAL HIGH (ref 6–20)
CO2: 28 mmol/L (ref 22–32)
Calcium: 8.4 mg/dL — ABNORMAL LOW (ref 8.9–10.3)
Chloride: 84 mmol/L — ABNORMAL LOW (ref 101–111)
Creatinine, Ser: 2.36 mg/dL — ABNORMAL HIGH (ref 0.44–1.00)
GFR calc Af Amer: 21 mL/min — ABNORMAL LOW (ref >60)
GFR calc non Af Amer: 18 mL/min — ABNORMAL LOW (ref >60)
Glucose, Bld: 171 mg/dL — ABNORMAL HIGH (ref 70–99)
POTASSIUM: 3.8 mmol/L (ref 3.5–5.1)
Phosphorus: 3.8 mg/dL (ref 2.5–4.6)
SODIUM: 124 mmol/L — AB (ref 135–145)

## 2015-03-27 LAB — CARBOXYHEMOGLOBIN
Carboxyhemoglobin: 1.6 % — ABNORMAL HIGH (ref 0.5–1.5)
Methemoglobin: 1.4 % (ref 0.0–1.5)
O2 Saturation: 97.5 %
TOTAL HEMOGLOBIN: 6.7 g/dL — AB (ref 12.0–16.0)

## 2015-03-27 MED ORDER — SODIUM CHLORIDE 0.9 % IV SOLN: 510.0000 mg | Freq: Once | INTRAVENOUS | Status: DC

## 2015-03-27 MED ORDER — VANCOMYCIN HCL IN DEXTROSE 1-5 GM/200ML-% IV SOLN
1000.0000 mg | INTRAVENOUS | Status: AC
  Filled 2015-03-27: qty 200

## 2015-03-27 MED ORDER — INSULIN DETEMIR 100 UNIT/ML ~~LOC~~ SOLN
18.0000 [IU] | Freq: Every day | SUBCUTANEOUS | Status: DC
Start: 2015-03-28 — End: 2015-04-02
  Administered 2015-03-28 – 2015-04-02 (×5): 18 [IU] via SUBCUTANEOUS
  Filled 2015-03-27 (×6): qty 0.18

## 2015-03-27 NOTE — Care Management Note (Signed)
Case Management Note  Patient Details  Name: CHAVON LUCARELLI MRN: [id number] Date of Birth: 04-Dec-1933  Subjective/Objective:                    Action/Plan:   Expected Discharge Date:                  Expected Discharge Plan:  [place]  In-House Referral:     Discharge planning Services     Post Acute Care Choice:  Resumption of Svcs/PTA Provider, Durable Medical Equipment Choice offered to:  Patient  DME Arranged:  Walker rolling with seat, Bedside commode DME Agency:  [place]:  RN [facility] Agency:  [place]  Status of Service:     Medicare Important Message Given:    Date Medicare IM Given:    Medicare IM give by:    Date Additional Medicare IM Given:    Additional Medicare Important Message give by:     If discussed at [place] of Stay Meetings, dates discussed:  03/28/15  Additional Comments:  Lacretia Leigh, RN 03/27/2015, 9:50 AM

## 2015-03-27 NOTE — Progress Notes (Signed)
Advanced Heart Failure Rounding Note   Subjective:    Admitted 4/23 with recurrent HF. Had several episodes of flush pulmonary edema in setting of severe HTN.    Underwent R/L cath on 4/25 with showed 60% LM (confirmed by IVUS) and 95% L renal artery stenosis. (R ok)  RA = 2 RV = 35/1/3 PA = 31/11 (21) PCW = 12 Fick cardiac output/index = 3.5/2.8 PVR = 2.5 WU SVR = 2121  FA sat = 94% PA sat = 55%, 57%  Remains on po diuretics. Weight unchanged. Yesterday doxazosin was added for HTN and urinary retention. BP better. No further urinary retention.  Speech evaluated with no intervention required. Creatinine continues to improve. Now 2.3 (Baseline 1.5-1.8). Says she feels normal. CVP 2    Objective:   Weight Range:  Vital Signs:   Temp:  [97.2 F (36.2 C)-98.3 F (36.8 C)] 97.7 F (36.5 C) (05/04 0746) Pulse Rate:  [81-97] 94 (05/04 0746) Resp:  [13-26] 22 (05/04 0746) BP: (107-137)/(44-81) 131/59 mmHg (05/04 0746) SpO2:  [95 %-98 %] 96 % (05/04 0746) Weight:  [152 lb 12.5 oz (69.3 kg)] 152 lb 12.5 oz (69.3 kg) (05/04 0350) Last BM Date: 03/21/15  Weight change: Filed Weights   03/25/15 0420 03/26/15 0400 03/27/15 0350  Weight: 151 lb 14.4 oz (68.9 kg) 152 lb 14.4 oz (69.355 kg) 152 lb 12.5 oz (69.3 kg)    Intake/Output:   Intake/Output Summary (Last 24 hours) at 03/27/15 0844 Last data filed at 03/27/15 0800  Gross per 24 hour  Intake    837 ml  Output   1300 ml  Net   -463 ml     Physical Exam: General:  Elderly Sitting in chair HEENT: normal Neck: supple. JVP 2 Carotids 2+ bilat; no bruits. No lymphadenopathy or thryomegaly appreciated. Cor: PMI nondisplaced. Regular rate & rhythm. No rubs, gallops or murmurs. Lungs: clear Abdomen: soft, nontender, nondistended. No hepatosplenomegaly. No bruits or masses. Good bowel sounds. Extremities: no cyanosis, clubbing, rash, trace-1+ edema Neuro: alert & orientedx3, cranial nerves grossly intact. moves all 4  extremities w/o difficulty. Affect pleasant  Telemetry: Sinus 90  Labs: Basic Metabolic Panel:  Recent Labs Lab 03/23/15 0429 03/24/15 0452 03/25/15 0412 03/26/15 0500 03/27/15 0516  NA 127* 126* 128* 128* 124*  K 4.1 3.3* 3.0* 4.0 3.8  CL 87* 84* 85* 86* 84*  CO2 28 28 30 27 28   GLUCOSE 185* 199* 148* 193* 171*  BUN 56* 66* 70* 69* 68*  CREATININE 3.07* 2.93* 2.77* 2.54* 2.36*  CALCIUM 9.0 8.8* 8.6* 8.7* 8.4*  PHOS  --   --   --  3.9 3.8    Liver Function Tests:  Recent Labs Lab 03/26/15 0500 03/27/15 0516  ALBUMIN 3.2* 3.1*   No results for input(s): LIPASE, AMYLASE in the last 168 hours. No results for input(s): AMMONIA in the last 168 hours.  CBC:  Recent Labs Lab 03/23/15 0429 03/24/15 0452 03/25/15 0412 03/26/15 0500 03/27/15 0516  WBC 10.5 11.4* 10.1 10.1 7.9  HGB 9.0* 8.9* 8.8* 9.3* 8.5*  HCT 27.4* 27.1* 26.3* 28.3* 25.4*  MCV 84.6 85.2 84.8 85.8 84.4  PLT 392 423* 430* 496* 420*    Cardiac Enzymes: No results for input(s): CKTOTAL, CKMB, CKMBINDEX, TROPONINI in the last 168 hours.  BNP: BNP (last 3 results)  Recent Labs  02/14/15 0247 03/16/15 0650  BNP 1346.9* 648.2*    ProBNP (last 3 results)  Recent Labs  03/01/15 0840  PROBNP 742.0*  Other results:  Imaging: No results found.   Medications:     Scheduled Medications: . amLODipine  10 mg Oral Daily  . antiseptic oral rinse  7 mL Mouth Rinse q12n4p  . aspirin EC  81 mg Oral Daily  . atorvastatin  10 mg Oral Daily  . calcium citrate  1 tablet Oral Daily  . carvedilol  3.125 mg Oral BID WC  . chlorhexidine  15 mL Mouth Rinse BID  . cholecalciferol  1,000 Units Oral Daily  . docusate sodium  100 mg Oral BID  . doxazosin  1 mg Oral Daily  . folic acid-pyridoxine-cyancobalamin  1 tablet Oral BID  . heparin  5,000 Units Subcutaneous 3 times per day  . hydrALAZINE  100 mg Oral 3 times per day  . insulin aspart  0-5 Units Subcutaneous QHS  . insulin aspart  0-9  Units Subcutaneous TID WC  . insulin detemir  15 Units Subcutaneous Daily  . iron polysaccharides  150 mg Oral BID  . isosorbide mononitrate  30 mg Oral BID  . latanoprost  1 drop Both Eyes QHS  . linagliptin  5 mg Oral Daily  . sodium chloride  10-40 mL Intracatheter Q12H  . sodium chloride  3 mL Intravenous Q12H  . spironolactone  12.5 mg Oral Daily  . torsemide  40 mg Oral BID  . vitamin C  1,000 mg Oral Daily  . vitamin E  400 Units Oral Daily    Infusions: . nitroGLYCERIN Stopped (03/22/15 1300)    PRN Medications: sodium chloride, acetaminophen, ALPRAZolam, alum & mag hydroxide-simeth, hydrALAZINE, magnesium hydroxide, nitroGLYCERIN, ondansetron (ZOFRAN) IV, sodium chloride, sodium chloride, zolpidem   Assessment:   1. Acute respiratory failure due flash pulmonary edema 2. Acute on chronic systolic HF, EF 23-53% 3. CKD stage 3 4. DM2 5. LBBB 6. HTN 7. Hypokalemia/hyponatremia 8. Acute on chronic renal failure, now stage 4 9. CAD with 60% LM lesion 10. Unilateral L renal artery stenosis 11. Urinary retention   Plan/Discussion:    Respiratory status stable. Creatinine continues to improve. Volume status ok. Hold torsemide for now. Decrease to 20 bid as CVP rebounds. Follow CVP. Co-ox good.   Given underlying CKD and severe HTN with episodes of flash pulmonary I think she needs stent of L renal artery.  Although this can be done with minimal contrast would like to see creatinine a little closer to baseline first - hopefully by end of week or early next week. Creatinine down to 2.36.  BP better controlled. Continue  to help with BP and also help reduce bladder neck tone.   Per Dr. Louis Meckel in Urology about urinary retention. Has suggested alpha-blocker + bladder training. Will do bladder scan every 6 hours if > 400cc will do in/out cath. If fails this regimen, will need indwelling Foley.   C/o swallowing difficulty. Speech evaluated and recommended regular diet. No  intervention required.    Iron sats low. s/p Ferraheme  No ACE or ARB with renal failure. Keep b-blocker at low dose.   PT following.  CLEGG,AMY, NP-C  8:44 AM Advanced Heart Failure Team Pager [phone] (M-F; 7a - 4p)  Please contact [facility] for night-coverage after hours (4p -7a ) and weekends on [email or web address]  Patient seen and examined with Darrick Grinder, NP. We discussed all aspects of the encounter. I agree with the assessment and plan as stated above.   Much improved. BP and renal function better. CVP now 2. Will hold demadex today and decrease to  20 bid as CVP comes back up.  Urinary retention improved. Will continue to follow.   Will touch base with [facility] regarding timing of RA stent. May be next Tuesday.   Continue PT.   At[place],MD 10:05 AM

## 2015-03-27 NOTE — Progress Notes (Signed)
Physical Therapy Treatment Patient Details Name: Katie Fuentes MRN: [id number] DOB: 22-Dec-1933 Today's Date: 04/14/15    History of Present Illness Admitted 4/23 with recurrent HF. Had several episodes of flush pulmonary edema in setting of severe HTN    PT Comments    Patient fatigued after up in chair all day and ready to go back to bed so did not want to walk far, but actually did increase distance compared to last session.  Will need HHPT and family assist at d/c.  Follow Up Recommendations  Home health PT;Supervision for mobility/OOB     Equipment Recommendations  Rolling walker with 5" wheels;3in1 (PT)    Recommendations for Other Services       Precautions / Restrictions Precautions Precautions: Fall    Mobility  Bed Mobility Overal bed mobility: Needs Assistance Bed Mobility: Sit to Supine       Sit to supine: Min guard   General bed mobility comments: assist and cues for positioning in bed, pt able to lift feet up unaided  Transfers Overall transfer level: Needs assistance Equipment used: Rolling walker (2 wheeled) Transfers: Sit to/from Stand Sit to Stand: Min guard         General transfer comment: up from low recliner, assist with lines and due to LE strength  Ambulation/Gait Ambulation/Gait assistance: Min guard;Supervision Ambulation Distance (Feet): 100 Feet (with standing rest breaks) Assistive device: Rolling walker (2 wheeled) Gait Pattern/deviations: Step-through pattern;Decreased stride length;Shuffle;Trunk flexed   Gait velocity interpretation: Below normal speed for age/gender General Gait Details: assist to turn walker, pt c/o fatigue, several standing rest breaks   Stairs            Wheelchair Mobility    Modified Rankin (Stroke Patients Only)       Balance Overall balance assessment: Needs assistance         Standing balance support: Bilateral upper extremity supported Standing balance-Leahy Scale: Poor                       Cognition Arousal/Alertness: Awake/alert Behavior During Therapy: WFL for tasks assessed/performed Overall Cognitive Status: Within Functional Limits for tasks assessed                      Exercises      General Comments        Pertinent Vitals/Pain Pain Score: 3  Pain Location: feet due to neuropathy (they feel like sponges) Pain Intervention(s): Monitored during session    Home Living                      Prior Function            PT Goals (current goals can now be found in the care plan section) Progress towards PT goals: Progressing toward goals    Frequency  Min 3X/week    PT Plan Current plan remains appropriate    Co-evaluation             End of Session Equipment Utilized During Treatment: Gait belt;Oxygen Activity Tolerance: Patient limited by fatigue Patient left: in bed;with call bell/phone within reach     Time: 1414-1435 PT Time Calculation (min) (ACUTE ONLY): 21 min  Charges:  $Gait Training: 8-22 mins                    G Codes:      Emili Mcloughlin,CYNDI Apr 14, 2015, 4:51 PM  Magda Kiel, [place] 04/14/15

## 2015-03-27 NOTE — Progress Notes (Signed)
[facility] Rounding Note  Subjective:  Breathing is stable.   UOP has been good Cards holding torsemide for CVP of 2 Creatinine continues to slowly improve Renal artery procedure ? For first part of the week? Got feraheme yesterday for low tsat Will need 1 more dose in a week (ordered)  05/03 0701 - 05/04 0700 In: 1077 [P.O.:960; IV Piggyback:117] Out: 1050 [Urine:1050]  Filed Weights   03/25/15 0420 03/26/15 0400 03/27/15 0350  Weight: 68.9 kg (151 lb 14.4 oz) 69.355 kg (152 lb 14.4 oz) 69.3 kg (152 lb 12.5 oz)    Physical Exam:  Blood pressure 140/52, pulse 95, temperature 97.3 F (36.3 C), temperature source Oral, resp. rate 18, height 5\' 2"  (1.575 m), weight 68.9 kg (151 lb 14.4 oz), last menstrual period 07/24/1977, SpO2 95 %. GEN: NAD, up in the chair CVP 2 CV: Regular, S1S2 No S3  HR 90's PULM: anteriorly clear, posteriorly diminished ABD: Protuberant, soft, NT.  SKIN: No rashes/lesions Trace LE edema  Basic Metabolic Panel:  Recent Labs  03/26/15 0500 03/27/15 0516  NA 128* 124*  K 4.0 3.8  CL 86* 84*  CO2 27 28  GLUCOSE 193* 171*  BUN 69* 68*  CREATININE 2.54* 2.36*  CALCIUM 8.7* 8.4*  PHOS 3.9 3.8    Recent Labs  03/26/15 0500 03/27/15 0516  WBC 10.1 7.9  HGB 9.3* 8.5*  HCT 28.3* 25.4*  MCV 85.8 84.4  PLT 496* 420*   Results for Katie, Fuentes (MRN [id number]) as of 03/26/2015 11:42  Ref. Range 03/26/2015 05:00  Iron Latest Ref Range: 28-170 ug/dL 29  UIBC Latest Units: ug/dL 306  TIBC Latest Ref Range: 250-450 ug/dL 335  Saturation Ratios Latest Ref Range: 10.4-31.8 % 9 (L)    Scheduled Meds: . amLODipine  10 mg Oral Daily  . antiseptic oral rinse  7 mL Mouth Rinse q12n4p  . aspirin EC  81 mg Oral Daily  . atorvastatin  10 mg Oral Daily  . calcium citrate  1 tablet Oral Daily  . carvedilol  3.125 mg Oral BID WC  . chlorhexidine  15 mL Mouth Rinse BID  . cholecalciferol  1,000 Units Oral Daily  . docusate sodium   100 mg Oral BID  . doxazosin  1 mg Oral Daily  . folic acid-pyridoxine-cyancobalamin  1 tablet Oral BID  . heparin  5,000 Units Subcutaneous 3 times per day  . hydrALAZINE  100 mg Oral 3 times per day  . insulin aspart  0-5 Units Subcutaneous QHS  . insulin aspart  0-9 Units Subcutaneous TID WC  . [START ON 03/28/2015] insulin detemir  18 Units Subcutaneous Daily  . iron polysaccharides  150 mg Oral BID  . isosorbide mononitrate  30 mg Oral BID  . latanoprost  1 drop Both Eyes QHS  . linagliptin  5 mg Oral Daily  . sodium chloride  10-40 mL Intracatheter Q12H  . sodium chloride  3 mL Intravenous Q12H  . spironolactone  12.5 mg Oral Daily  . vitamin C  1,000 mg Oral Daily  . 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 BL SCr 1.4-1.6, suspect chronic disease has multifactorial etiology including DM/HTN/ASCVD/RAS GFR slowly recovering after contrast nephropathy - creatinine continues to slowly fall Renal US 01/2015 w/o structural issues Daily weights, Daily Renal Panel, Strict I/Os, Avoid nephrotoxins (NSAIDs, judicious IV Contrast)  Hyponatremia, Hypervolemia,  Urine osmolality was inappropriately elevated, interestingly serum osmolarity was normal Serum sodium 124-128 Suspect related to CHF Fluid restricted to 1.2L Continuing to monitor        Hypokalemia Secondary to loop diuretics, also on spironolactone Replace as needed        L RAS secondary to atherosclerotic disease 95% ostial on 03/18/15 angiogram For eventual stent placement  Would like to have returned to baseline GFR before repeat  angiogram/contrast exposure - looks like may be next week               Acute on chronic systolic heart failure: Managed by AHF         Anemia - TSat 8% done back in March. Repeat done today tsat of 9%. Dose of Feraheme 510 IV yesterday. Will order a second dose for next week.  At this point am adding little to her day to day care - Dr. Jeffie Pollock managing diuretics and we are basically just waiting for creatinine to fall to a safe level for RA stenting. Will sign off at this time - call us back if needed next week around the time of RA stenting.  We can arrange CKD followup in our office after discharge from the hospital if still desired.   Jamal Maes, MD [facility] [phone] Pager 03/27/2015, 10:48 AM

## 2015-03-28 LAB — RENAL FUNCTION PANEL
ANION GAP: 12 (ref 5–15)
Albumin: 3 g/dL — ABNORMAL LOW (ref 3.5–5.0)
BUN: 59 mg/dL — AB (ref 6–20)
CHLORIDE: 87 mmol/L — AB (ref 101–111)
CO2: 28 mmol/L (ref 22–32)
CREATININE: 2.1 mg/dL — AB (ref 0.44–1.00)
Calcium: 8.1 mg/dL — ABNORMAL LOW (ref 8.9–10.3)
GFR calc Af Amer: 25 mL/min — ABNORMAL LOW (ref >60)
GFR, EST NON AFRICAN AMERICAN: 21 mL/min — AB (ref >60)
Glucose, Bld: 138 mg/dL — ABNORMAL HIGH (ref 70–99)
Phosphorus: 3.5 mg/dL (ref 2.5–4.6)
Potassium: 3.6 mmol/L (ref 3.5–5.1)
Sodium: 127 mmol/L — ABNORMAL LOW (ref 135–145)

## 2015-03-28 LAB — CARBOXYHEMOGLOBIN
CARBOXYHEMOGLOBIN: 1.3 % (ref 0.5–1.5)
METHEMOGLOBIN: 1.5 % (ref 0.0–1.5)
O2 Saturation: 68.1 %
Total hemoglobin: 8.5 g/dL — ABNORMAL LOW (ref 12.0–16.0)

## 2015-03-28 LAB — GLUCOSE, CAPILLARY
GLUCOSE-CAPILLARY: 182 mg/dL — AB (ref 70–99)
Glucose-Capillary: 205 mg/dL — ABNORMAL HIGH (ref 70–99)

## 2015-03-28 LAB — CBC
HCT: 25.9 % — ABNORMAL LOW (ref 36.0–46.0)
Hemoglobin: 8.8 g/dL — ABNORMAL LOW (ref 12.0–15.0)
MCH: 28.8 pg (ref 26.0–34.0)
MCHC: 34 g/dL (ref 30.0–36.0)
MCV: 84.6 fL (ref 78.0–100.0)
Platelets: 427 10*3/uL — ABNORMAL HIGH (ref 150–400)
RBC: 3.06 MIL/uL — AB (ref 3.87–5.11)
RDW: 14.4 % (ref 11.5–15.5)
WBC: 9.5 10*3/uL (ref 4.0–10.5)

## 2015-03-28 MED ORDER — SORBITOL 70 % PO SOLN
30.0000 mL | Freq: Every day | ORAL | Status: DC | PRN
  Administered 2015-03-28: 30 mL via ORAL
  Filled 2015-03-28 (×2): qty 30

## 2015-03-28 MED ORDER — TORSEMIDE 20 MG PO TABS
20.0000 mg | ORAL_TABLET | Freq: Two times a day (BID) | ORAL | Status: DC
  Administered 2015-03-28 – 2015-03-30 (×3): 20 mg via ORAL
  Filled 2015-03-28 (×7): qty 1

## 2015-03-28 MED ORDER — ALTEPLASE 2 MG IJ SOLR
2.0000 mg | Freq: Once | INTRAMUSCULAR | Status: AC
  Administered 2015-03-28: 2 mg
  Filled 2015-03-28: qty 2

## 2015-03-28 NOTE — Consult Note (Signed)
   [facility] CM Inpatient Consult   03/28/2015  Katie Fuentes 11-Aug-1934 [id number] Referral received to assess for care management services. Explained that [facility] is a covered benefit of insurance.   Met with the patient regarding the benefits of [facility] services through her [facility] Medicare. Review information for [facility] and a brochure was provided with contact information.  Explained that [facility] does not interfere with or replace any services arranged by the inpatient care management staff.   Patient requested to have her husband to review the information.  Will plan to follow up with the patient on tomorrow.  For questions, contact: Natividad Brood, RN BSN [facility] Liaison  [phone] business mobile phone

## 2015-03-28 NOTE — Progress Notes (Signed)
Advanced Heart Failure Rounding Note   Subjective:    Admitted 4/23 with recurrent HF. Had several episodes of flush pulmonary edema in setting of severe HTN.    Underwent R/L cath on 4/25 with showed 60% LM (confirmed by IVUS) and 95% L renal artery stenosis. (R ok)  RA = 2 RV = 35/1/3 PA = 31/11 (21) PCW = 12 Fick cardiac output/index = 3.5/2.8 PVR = 2.5 WU SVR = 2121  FA sat = 94% PA sat = 55%, 57%  Diuretics held yesterday due to CVP 2.  Weight up 2 pounds. SBP well controlled. . No further urinary retention.  Speech evaluated with no intervention required. Creatinine continues to improve. Now 2.1 (Baseline 1.5-1.8). CVP 6-7. C/o constipation.    Objective:   Weight Range:  Vital Signs:   Temp:  [97.5 F (36.4 C)-97.8 F (36.6 C)] 97.6 F (36.4 C) (05/05 0400) Pulse Rate:  [85-91] 90 (05/05 0400) Resp:  [18-23] 22 (05/05 0400) BP: (97-132)/(41-59) 132/49 mmHg (05/05 0554) SpO2:  [96 %-99 %] 96 % (05/05 0400) Weight:  [69.9 kg (154 lb 1.6 oz)] 69.9 kg (154 lb 1.6 oz) (05/05 0500) Last BM Date: 03/21/15  Weight change: Filed Weights   03/26/15 0400 03/27/15 0350 03/28/15 0500  Weight: 69.355 kg (152 lb 14.4 oz) 69.3 kg (152 lb 12.5 oz) 69.9 kg (154 lb 1.6 oz)    Intake/Output:   Intake/Output Summary (Last 24 hours) at 03/28/15 0932 Last data filed at 03/28/15 0400  Gross per 24 hour  Intake    580 ml  Output    900 ml  Net   -320 ml     Physical Exam: General:  Elderly Sitting in chair HEENT: normal Neck: supple. JVP 6-7  Carotids 2+ bilat; no bruits. No lymphadenopathy or thryomegaly appreciated. Cor: PMI nondisplaced. Regular rate & rhythm. No rubs, gallops or murmurs. Lungs: clear Abdomen: soft, nontender, nondistended. No hepatosplenomegaly. No bruits or masses. Good bowel sounds. Extremities: no cyanosis, clubbing, rash, trace-1+ edema Neuro: alert & orientedx3, cranial nerves grossly intact. moves all 4 extremities w/o difficulty. Affect  pleasant  Telemetry: Sinus 90  Labs: Basic Metabolic Panel:  Recent Labs Lab 03/24/15 0452 03/25/15 0412 03/26/15 0500 03/27/15 0516 03/28/15 0432  NA 126* 128* 128* 124* 127*  K 3.3* 3.0* 4.0 3.8 3.6  CL 84* 85* 86* 84* 87*  CO2 28 30 27 28 28   GLUCOSE 199* 148* 193* 171* 138*  BUN 66* 70* 69* 68* 59*  CREATININE 2.93* 2.77* 2.54* 2.36* 2.10*  CALCIUM 8.8* 8.6* 8.7* 8.4* 8.1*  PHOS  --   --  3.9 3.8 3.5    Liver Function Tests:  Recent Labs Lab 03/26/15 0500 03/27/15 0516 03/28/15 0432  ALBUMIN 3.2* 3.1* 3.0*   No results for input(s): LIPASE, AMYLASE in the last 168 hours. No results for input(s): AMMONIA in the last 168 hours.  CBC:  Recent Labs Lab 03/24/15 0452 03/25/15 0412 03/26/15 0500 03/27/15 0516 03/28/15 0432  WBC 11.4* 10.1 10.1 7.9 9.5  HGB 8.9* 8.8* 9.3* 8.5* 8.8*  HCT 27.1* 26.3* 28.3* 25.4* 25.9*  MCV 85.2 84.8 85.8 84.4 84.6  PLT 423* 430* 496* 420* 427*    Cardiac Enzymes: No results for input(s): CKTOTAL, CKMB, CKMBINDEX, TROPONINI in the last 168 hours.  BNP: BNP (last 3 results)  Recent Labs  02/14/15 0247 03/16/15 0650  BNP 1346.9* 648.2*    ProBNP (last 3 results)  Recent Labs  03/01/15 0840  PROBNP 742.0*  Other results:  Imaging: No results found.   Medications:     Scheduled Medications: . amLODipine  10 mg Oral Daily  . antiseptic oral rinse  7 mL Mouth Rinse q12n4p  . aspirin EC  81 mg Oral Daily  . atorvastatin  10 mg Oral Daily  . calcium citrate  1 tablet Oral Daily  . carvedilol  3.125 mg Oral BID WC  . chlorhexidine  15 mL Mouth Rinse BID  . cholecalciferol  1,000 Units Oral Daily  . docusate sodium  100 mg Oral BID  . doxazosin  1 mg Oral Daily  . [START ON 04/02/2015] ferumoxytol  510 mg Intravenous Once  . folic acid-pyridoxine-cyancobalamin  1 tablet Oral BID  . heparin  5,000 Units Subcutaneous 3 times per day  . hydrALAZINE  100 mg Oral 3 times per day  . insulin aspart  0-5  Units Subcutaneous QHS  . insulin aspart  0-9 Units Subcutaneous TID WC  . insulin detemir  18 Units Subcutaneous Daily  . iron polysaccharides  150 mg Oral BID  . isosorbide mononitrate  30 mg Oral BID  . latanoprost  1 drop Both Eyes QHS  . linagliptin  5 mg Oral Daily  . sodium chloride  10-40 mL Intracatheter Q12H  . sodium chloride  3 mL Intravenous Q12H  . spironolactone  12.5 mg Oral Daily  . [START ON 03/29/2015] vancomycin  1,000 mg Intravenous On Call to OR  . vitamin C  1,000 mg Oral Daily  . vitamin E  400 Units Oral Daily    Infusions:    PRN Medications: sodium chloride, acetaminophen, ALPRAZolam, alum & mag hydroxide-simeth, hydrALAZINE, magnesium hydroxide, nitroGLYCERIN, ondansetron (ZOFRAN) IV, sodium chloride, sodium chloride, zolpidem   Assessment:   1. Acute respiratory failure due flash pulmonary edema 2. Acute on chronic systolic HF, EF 48-18% 3. CKD stage 3 4. DM2 5. LBBB 6. HTN 7. Hypokalemia/hyponatremia 8. Acute on chronic renal failure, now stage 4 9. CAD with 60% LM lesion 10. Unilateral L renal artery stenosis 11. Urinary retention   Plan/Discussion:    Much improved. BP and renal function better. CVP going back up. Restart demadex at 20 bid.  Urinary retention improved. Will continue to follow.   Will touch base with [facility] regarding timing of RA stent. Possibly tomorrow vs Tuesday.  Continue PT. Consult CR.   Sorbitol for constipation.  [name], MD  8:07 AM Advanced Heart Failure Team Pager [phone] (M-F; 7a - 4p)  Please contact [facility] for night-coverage after hours (4p -7a ) and weekends on [email or web address]

## 2015-03-28 NOTE — Progress Notes (Signed)
CARDIAC REHAB PHASE I   PRE:  Rate/Rhythm: 90 SR  BP:  Supine:   Sitting: 118/48  Standing:    SaO2: 98% 3L  MODE:  Ambulation: 50 ft   POST:  Rate/Rhythm: 97 SR  BP:  Supine:   Sitting: 123/44  Standing:    SaO2: 97% 3L 1130-1207 Pt did not want to walk at first since she got a laxative. Encouraged her to try to walk a little. Pt walked 50 ft on 3L with gait belt use, rolling walker and asst x 2 with slow pace. Stopped several times to rest. Denied dizziness but did c/o arms tired. Had pt lower and shake arms to help. To recliner after walk. Call bell in reach.    Graylon Good, RN BSN  03/28/2015 12:03 PM

## 2015-03-28 NOTE — Care Management Note (Signed)
Case Management Note  Patient Details  Name: Katie Fuentes MRN: [id number] Date of Birth: 03/28/1934  Subjective/Objective:                    Action/Plan:   Expected Discharge Date:                  Expected Discharge Plan:  [place]  In-House Referral:     Discharge planning Services     Post Acute Care Choice:  Resumption of Svcs/PTA Provider, Durable Medical Equipment Choice offered to:  Patient  DME Arranged:  Walker rolling with seat, Bedside commode DME Agency:  [place]:  RN, PT [facility] Agency:  [place]  Status of Service:     Medicare Important Message Given:  Yes Date Medicare IM Given:  03/28/15 Medicare IM give by:  debbie Temple Sporer rn,bsn Date Additional Medicare IM Given:    Additional Medicare Important Message give by:     If discussed at [place] of Stay Meetings, dates discussed:    Additional Comments:5/5 im given to pt.  Lacretia Leigh, RN 03/28/2015, 10:10 AM

## 2015-03-29 ENCOUNTER — Encounter (HOSPITAL_COMMUNITY)
Admission: EM | Disposition: A | Discharge status: Home Health | Payer: Medicare Other | Source: Home / Self Care | Attending: Cardiology

## 2015-03-29 DIAGNOSIS — I701 Atherosclerosis of renal artery: Secondary | ICD-10-CM

## 2015-03-29 DIAGNOSIS — N184 Chronic kidney disease, stage 4 (severe): Secondary | ICD-10-CM

## 2015-03-29 HISTORY — PX: PERCUTANEOUS STENT INTERVENTION: SHX5500

## 2015-03-29 HISTORY — PX: PERIPHERAL VASCULAR CATHETERIZATION: SHX172C

## 2015-03-29 LAB — BASIC METABOLIC PANEL WITH GFR
Anion gap: 10 (ref 5–15)
BUN: 57 mg/dL — ABNORMAL HIGH (ref 6–20)
CO2: 28 mmol/L (ref 22–32)
Calcium: 8.5 mg/dL — ABNORMAL LOW (ref 8.9–10.3)
Chloride: 87 mmol/L — ABNORMAL LOW (ref 101–111)
Creatinine, Ser: 2.1 mg/dL — ABNORMAL HIGH (ref 0.44–1.00)
GFR calc Af Amer: 25 mL/min — ABNORMAL LOW
GFR calc non Af Amer: 21 mL/min — ABNORMAL LOW
Glucose, Bld: 101 mg/dL — ABNORMAL HIGH (ref 70–99)
Potassium: 3.6 mmol/L (ref 3.5–5.1)
Sodium: 125 mmol/L — ABNORMAL LOW (ref 135–145)

## 2015-03-29 LAB — GLUCOSE, CAPILLARY
GLUCOSE-CAPILLARY: 133 mg/dL — AB (ref 70–99)
Glucose-Capillary: 124 mg/dL — ABNORMAL HIGH (ref 70–99)
Glucose-Capillary: 171 mg/dL — ABNORMAL HIGH (ref 70–99)
Glucose-Capillary: 205 mg/dL — ABNORMAL HIGH (ref 70–99)
Glucose-Capillary: 302 mg/dL — ABNORMAL HIGH (ref 70–99)

## 2015-03-29 LAB — CARBOXYHEMOGLOBIN
CARBOXYHEMOGLOBIN: 1.3 % (ref 0.5–1.5)
METHEMOGLOBIN: 1.1 % (ref 0.0–1.5)
O2 SAT: 90.4 %
Total hemoglobin: 8.3 g/dL — ABNORMAL LOW (ref 12.0–16.0)

## 2015-03-29 LAB — CBC
HEMATOCRIT: 24.5 % — AB (ref 36.0–46.0)
HEMOGLOBIN: 8.2 g/dL — AB (ref 12.0–15.0)
MCH: 28.1 pg (ref 26.0–34.0)
MCHC: 33.5 g/dL (ref 30.0–36.0)
MCV: 83.9 fL (ref 78.0–100.0)
Platelets: 407 10*3/uL — ABNORMAL HIGH (ref 150–400)
RBC: 2.92 MIL/uL — AB (ref 3.87–5.11)
RDW: 14.3 % (ref 11.5–15.5)
WBC: 9.1 10*3/uL (ref 4.0–10.5)

## 2015-03-29 LAB — POCT ACTIVATED CLOTTING TIME
ACTIVATED CLOTTING TIME: 202 s
Activated Clotting Time: 177 seconds
Activated Clotting Time: 214 seconds
Activated Clotting Time: 165 seconds

## 2015-03-29 SURGERY — RENAL INTERVENTION
Anesthesia: LOCAL

## 2015-03-29 MED ORDER — FENTANYL CITRATE (PF) 100 MCG/2ML IJ SOLN
INTRAMUSCULAR | Status: AC
  Filled 2015-03-29: qty 2

## 2015-03-29 MED ORDER — IODIXANOL 320 MG/ML IV SOLN
INTRAVENOUS | Status: DC | PRN
  Administered 2015-03-29: 35 mL via INTRA_ARTERIAL

## 2015-03-29 MED ORDER — CETYLPYRIDINIUM CHLORIDE 0.05 % MT LIQD
7.0000 mL | Freq: Two times a day (BID) | OROMUCOSAL | Status: DC
  Administered 2015-03-29 – 2015-04-02 (×8): 7 mL via OROMUCOSAL

## 2015-03-29 MED ORDER — SODIUM CHLORIDE 0.9 % IV SOLN
INTRAVENOUS | Status: DC
  Administered 2015-03-29: 17:00:00 via INTRAVENOUS

## 2015-03-29 MED ORDER — ATROPINE SULFATE 0.1 MG/ML IJ SOLN
INTRAMUSCULAR | Status: AC
  Filled 2015-03-29: qty 10

## 2015-03-29 MED ORDER — HEPARIN (PORCINE) IN NACL 2-0.9 UNIT/ML-% IJ SOLN
INTRAMUSCULAR | Status: AC
  Filled 2015-03-29: qty 1000

## 2015-03-29 MED ORDER — LIDOCAINE HCL (PF) 1 % IJ SOLN
INTRAMUSCULAR | Status: AC
  Filled 2015-03-29: qty 30

## 2015-03-29 MED ORDER — HEPARIN SODIUM (PORCINE) 1000 UNIT/ML IJ SOLN
INTRAMUSCULAR | Status: AC
  Filled 2015-03-29: qty 1

## 2015-03-29 MED ORDER — FENTANYL CITRATE (PF) 100 MCG/2ML IJ SOLN
INTRAMUSCULAR | Status: DC | PRN
  Administered 2015-03-29 (×2): 25 ug via INTRAVENOUS

## 2015-03-29 MED ORDER — SITAGLIPTIN PHOS-METFORMIN HCL 50-500 MG PO TABS: 1.0000 | ORAL_TABLET | Freq: Two times a day (BID) | ORAL | Status: DC

## 2015-03-29 MED ORDER — HEPARIN SODIUM (PORCINE) 1000 UNIT/ML IJ SOLN
INTRAMUSCULAR | Status: DC | PRN
Start: 2015-03-29 — End: 2015-03-29
  Administered 2015-03-29: 5000 [IU] via INTRA_ARTERIAL
  Administered 2015-03-29: 2000 [IU] via INTRAVENOUS
  Administered 2015-03-29 (×2): 1000 [IU] via INTRAVENOUS

## 2015-03-29 SURGICAL SUPPLY — 24 items
BALLN EUPHORA RX 2.0X15 (BALLOONS) ×3
BALLN VIATRAC 6X15X135 (BALLOONS) ×3
BALLOON AVIATOR PLUS 4X15 (BALLOONS) ×3 IMPLANT
BALLOON EUPHORA RX 2.0X15 (BALLOONS) ×2 IMPLANT
BALLOON VIATRAC 6X15X135 (BALLOONS) ×2 IMPLANT
CATH QUICKCROSS SUPP .035X90CM (MICROCATHETER) ×3 IMPLANT
COVER PRB 48X5XTLSCP FOLD TPE (BAG) ×2 IMPLANT
COVER PROBE 5X48 (BAG) ×1
DEVICE TORQUE H2O (MISCELLANEOUS) ×3 IMPLANT
DRAPE ZERO GRAVITY STERILE (DRAPES) ×3 IMPLANT
GLIDEWIRE ADV .035X180CM (WIRE) ×3 IMPLANT
GUIDE CATH VISTA IMA 6F (CATHETERS) ×3 IMPLANT
GUIDE CATH VISTA JR4 6F (CATHETERS) ×3 IMPLANT
GUIDE CATH VISTA RDC 6F (CATHETERS) ×3 IMPLANT
KIT ENCORE 26 ADVANTAGE (KITS) ×3 IMPLANT
KIT MICROINTRODUCER STIFF 5F (SHEATH) ×3 IMPLANT
KIT PV (KITS) ×3 IMPLANT
SHEATH PINNACLE 6F 10CM (SHEATH) ×3 IMPLANT
STENT HERCULINK RX 5.0X15X135 (Permanent Stent) ×3 IMPLANT
STENT PALMAZ BLUE 5X12X80 (Permanent Stent) ×3 IMPLANT
TRAY PV CATH (CUSTOM PROCEDURE TRAY) ×3 IMPLANT
WIRE BENTSON .035X145CM (WIRE) ×3 IMPLANT
WIRE SPARTACORE .014X190CM (WIRE) ×3 IMPLANT
WIRE STABILIZER XS .014X180CM (WIRE) ×3 IMPLANT

## 2015-03-29 NOTE — Consult Note (Signed)
   [facility] CM Inpatient Consult   03/29/2015  Katie Fuentes 18-May-1934 [id number] Attempts to reach out to patient but patient is currently in a procedure. Will attempt a later follow up at home if patient is discharged over the weekend. For questions, contact: Natividad Brood, RN BSN [facility] Liaison  [phone] business mobile phone

## 2015-03-29 NOTE — Progress Notes (Signed)
Advanced Heart Failure Rounding Note   Subjective:    81 y/o woman admitted 4/23 with recurrent HF. Had several episodes of flush pulmonary edema in setting of severe HTN.    Echo in March 2016; EF 30-35%  Underwent R/L cath on 4/25 with showed 60% LM (confirmed by IVUS) and 95% L renal artery stenosis. (R ok)  RA = 2 RV = 35/1/3 PA = 31/11 (21) PCW = 12 Fick cardiac output/index = 3.5/2.8 PVR = 2.5 WU SVR = 2121  FA sat = 94% PA sat = 55%, 57%  Feels good. Diuretics restarted yesterday as CVP starting to climb. No weight today. CVP 2.  I/Os inaccurate. SBP well controlled - maybe a bit low.No further urinary retention.  Creatinine stable at 2.1 (Baseline 1.5-1.8).    Objective:   Weight Range:  Vital Signs:   Temp:  [97.6 F (36.4 C)-98.2 F (36.8 C)] 98.2 F (36.8 C) (05/06 1139) Pulse Rate:  [78-90] 90 (05/06 1139) Resp:  [12-24] 18 (05/06 1139) BP: (96-136)/(38-66) 115/55 mmHg (05/06 1139) SpO2:  [96 %-99 %] 98 % (05/06 1139) Last BM Date: 03/28/15  Weight change: Filed Weights   03/26/15 0400 03/27/15 0350 03/28/15 0500  Weight: 69.355 kg (152 lb 14.4 oz) 69.3 kg (152 lb 12.5 oz) 69.9 kg (154 lb 1.6 oz)    Intake/Output:   Intake/Output Summary (Last 24 hours) at 03/29/15 1235 Last data filed at 03/29/15 0800  Gross per 24 hour  Intake      0 ml  Output   1225 ml  Net  -1225 ml     Physical Exam: General:  Elderly Sitting in chair HEENT: normal Neck: supple. JVP 6-7  Carotids 2+ bilat; no bruits. No lymphadenopathy or thryomegaly appreciated. Cor: PMI nondisplaced. Regular rate & rhythm. No rubs, gallops or murmurs. Lungs: clear Abdomen: soft, nontender, nondistended. No hepatosplenomegaly. No bruits or masses. Good bowel sounds. Extremities: no cyanosis, clubbing, rash, trace-1+ edema Neuro: alert & orientedx3, cranial nerves grossly intact. moves all 4 extremities w/o difficulty. Affect pleasant  Telemetry: Sinus 90  Labs: Basic Metabolic  Panel:  Recent Labs Lab 03/25/15 0412 03/26/15 0500 03/27/15 0516 03/28/15 0432 03/29/15 0513  NA 128* 128* 124* 127* 125*  K 3.0* 4.0 3.8 3.6 3.6  CL 85* 86* 84* 87* 87*  CO2 30 27 28 28 28   GLUCOSE 148* 193* 171* 138* 101*  BUN 70* 69* 68* 59* 57*  CREATININE 2.77* 2.54* 2.36* 2.10* 2.10*  CALCIUM 8.6* 8.7* 8.4* 8.1* 8.5*  PHOS  --  3.9 3.8 3.5  --     Liver Function Tests:  Recent Labs Lab 03/26/15 0500 03/27/15 0516 03/28/15 0432  ALBUMIN 3.2* 3.1* 3.0*   No results for input(s): LIPASE, AMYLASE in the last 168 hours. No results for input(s): AMMONIA in the last 168 hours.  CBC:  Recent Labs Lab 03/25/15 0412 03/26/15 0500 03/27/15 0516 03/28/15 0432 03/29/15 0513  WBC 10.1 10.1 7.9 9.5 9.1  HGB 8.8* 9.3* 8.5* 8.8* 8.2*  HCT 26.3* 28.3* 25.4* 25.9* 24.5*  MCV 84.8 85.8 84.4 84.6 83.9  PLT 430* 496* 420* 427* 407*    Cardiac Enzymes: No results for input(s): CKTOTAL, CKMB, CKMBINDEX, TROPONINI in the last 168 hours.  BNP: BNP (last 3 results)  Recent Labs  02/14/15 0247 03/16/15 0650  BNP 1346.9* 648.2*    ProBNP (last 3 results)  Recent Labs  03/01/15 0840  PROBNP 742.0*      Other results:  Imaging: No results  found.   Medications:     Scheduled Medications: . amLODipine  10 mg Oral Daily  . antiseptic oral rinse  7 mL Mouth Rinse q12n4p  . aspirin EC  81 mg Oral Daily  . atorvastatin  10 mg Oral Daily  . calcium citrate  1 tablet Oral Daily  . carvedilol  3.125 mg Oral BID WC  . chlorhexidine  15 mL Mouth Rinse BID  . cholecalciferol  1,000 Units Oral Daily  . docusate sodium  100 mg Oral BID  . doxazosin  1 mg Oral Daily  . [START ON 04/02/2015] ferumoxytol  510 mg Intravenous Once  . folic acid-pyridoxine-cyancobalamin  1 tablet Oral BID  . heparin  5,000 Units Subcutaneous 3 times per day  . hydrALAZINE  100 mg Oral 3 times per day  . insulin aspart  0-5 Units Subcutaneous QHS  . insulin aspart  0-9 Units  Subcutaneous TID WC  . insulin detemir  18 Units Subcutaneous Daily  . iron polysaccharides  150 mg Oral BID  . isosorbide mononitrate  30 mg Oral BID  . latanoprost  1 drop Both Eyes QHS  . linagliptin  5 mg Oral Daily  . sodium chloride  10-40 mL Intracatheter Q12H  . sodium chloride  3 mL Intravenous Q12H  . spironolactone  12.5 mg Oral Daily  . torsemide  20 mg Oral BID  . vancomycin  1,000 mg Intravenous On Call to OR  . vitamin C  1,000 mg Oral Daily  . vitamin E  400 Units Oral Daily    Infusions:    PRN Medications: sodium chloride, acetaminophen, ALPRAZolam, alum & mag hydroxide-simeth, hydrALAZINE, magnesium hydroxide, nitroGLYCERIN, ondansetron (ZOFRAN) IV, sodium chloride, sodium chloride, sorbitol, zolpidem   Assessment:   1. Acute respiratory failure due flash pulmonary edema 2. Acute on chronic systolic HF, EF 00-71% 3. CKD stage 3 4. DM2 5. LBBB 6. HTN 7. Hypokalemia/hyponatremia 8. Acute on chronic renal failure, now stage 4 9. CAD with 60% LM lesion 10. Unilateral L renal artery stenosis 11. Urinary retention   Plan/Discussion:    Much improved. HF stable. BP and renal function better. CVP low. Decreased demadex to 20 daily. Long talk about need to take extra demadex if weight goes up after discharge.   Urinary retention improved with doxazosin. Will continue to follow.   Renal artery stent scheduled for today. Need to watch BP closely. If begins to fall would cut back hydralazine. No ACE/ARB due to renal failure  Continue PT.Cardiac rehab  Hopefully home tomorrow or Sunday.   [name], MD  12:35 PM Advanced Heart Failure Team Pager [phone] (M-F; 7a - 4p)  Please contact [facility] for night-coverage after hours (4p -7a ) and weekends on [email or web address]

## 2015-03-29 NOTE — Progress Notes (Signed)
Physical Therapy Treatment Patient Details Name: Katie Fuentes MRN: [id number] DOB: 06/30/1934 Today's Date: Apr 01, 2015    History of Present Illness Admitted 4/23 with recurrent HF. Had several episodes of flash pulmonary edema in setting of severe HTN    PT Comments    Pt progressing well with increased gait distance and ability to tolerate HEP. Encouraged increased ambulation during day with nursing and continued HEP. Will continue to follow.  Follow Up Recommendations  Home health PT;Supervision for mobility/OOB     Equipment Recommendations       Recommendations for Other Services       Precautions / Restrictions Precautions Precautions: Fall    Mobility  Bed Mobility               General bed mobility comments: in chair on arrival  Transfers Overall transfer level: Needs assistance   Transfers: Sit to/from Stand Sit to Stand: Min guard         General transfer comment: cues for hand placement, safety and sequence  Ambulation/Gait Ambulation/Gait assistance: Min guard Ambulation Distance (Feet): 160 Feet Assistive device: Rolling walker (2 wheeled) Gait Pattern/deviations: Step-through pattern;Decreased stride length;Trunk flexed   Gait velocity interpretation: Below normal speed for age/gender General Gait Details: cues for posture, position in RW and encouragement to increase distance   Stairs            Wheelchair Mobility    Modified Rankin (Stroke Patients Only)       Balance Overall balance assessment: Needs assistance   Sitting balance-Leahy Scale: Good       Standing balance-Leahy Scale: Poor                      Cognition Arousal/Alertness: Awake/alert Behavior During Therapy: WFL for tasks assessed/performed Overall Cognitive Status: Within Functional Limits for tasks assessed                      Exercises General Exercises - Lower Extremity Long Arc Quad: AROM;Seated;Both;20 reps Hip  ABduction/ADduction: AROM;Seated;Both;20 reps Hip Flexion/Marching: AROM;Seated;Both;20 reps    General Comments        Pertinent Vitals/Pain Pain Assessment: No/denies pain  Hr 94 sats 97% 2L    Home Living                      Prior Function            PT Goals (current goals can now be found in the care plan section) Progress towards PT goals: Progressing toward goals    Frequency  Min 3X/week    PT Plan Current plan remains appropriate    Co-evaluation             End of Session Equipment Utilized During Treatment: Oxygen Activity Tolerance: Patient tolerated treatment well Patient left: in chair;with call bell/phone within reach     Time: 1829-9371 PT Time Calculation (min) (ACUTE ONLY): 23 min  Charges:  $Gait Training: 8-22 mins $Therapeutic Exercise: 8-22 mins                    G Codes:      Melford Aase 04/01/2015, 10:25 AM Elwyn Reach, [place]

## 2015-03-29 NOTE — Consult Note (Signed)
Consult Note  Patient name: Katie Fuentes MRN: [id number] DOB: [birth year] Sex: female  Consulting Physician:  Cardiology  Reason for Consult:  Chief Complaint  Patient presents with  . Shortness of Breath    HISTORY OF PRESENT ILLNESS: This is a 81 year old female with multiple medical problems who presented to the emergency department on 02/14/2015 with worsening shortness of breath.  She was admitted with recurrent heart failure and respiratory distress.  She underwent heart catheterization which identified a high-grade left renal artery stenosis.  Following her heart catheterization she went into acute renal failure.  This has now improved as has her volume status.  Request is made for stenting of her left renal artery, given her recurrent episodes of flash pulmonary edema, and severe hypertension.  The patient has type 2 diabetes.  Her last hemoglobin A1c was 7.5.  She is medically managed for hypertension on multiple medications.  Her hypercholesterolemia is managed with a statin.  Past Medical History  Diagnosis Date  . Atrophic vaginitis   . Yeast infection   . Glaucoma   . Cataracts, bilateral   . Type 2 diabetes, controlled, with renal manifestation 01/2000  . Hyperlipidemia   . Hypertension   . Congestive heart failure   . Cardiomyopathy   . LBBB (left bundle branch block)   . Chronic renal disease, stage III     Past Surgical History  Procedure Laterality Date  . Vaginal delivery      x3  . Colonoscopy  05/2003  . Finger surgery    . Cataract extraction w/ intraocular lens implant Right 2011    1 year later the left side done  . Colonoscopy  06/2008    Dr. Lajoyce Corners recheck in 5 years  . Total vaginal hysterectomy  1978    for endometriosis  . Appendectomy  1950  . Squamous cell carcinoma excision  6/15    Nose  . Left and right heart catheterization with coronary angiogram N/A 03/18/2015    Procedure: LEFT AND RIGHT HEART CATHETERIZATION WITH CORONARY  ANGIOGRAM;  Surgeon: Jolaine Artist, MD;  Location: [facility] CATH LAB;  Service: Cardiovascular;  Laterality: N/A;    History   Social History  . Marital Status: Married    Spouse Name: N/A  . Number of Children: N/A  . Years of Education: N/A   Occupational History  . Not on file.   Social History Main Topics  . Smoking status: Former Smoker -- 0.10 packs/day for 2 years    Types: Cigarettes  . Smokeless tobacco: Never Used  . Alcohol Use: No  . Drug Use: No  . Sexual Activity: Yes    Birth Control/ Protection: Surgical     Comment: [facility]   Other Topics Concern  . Not on file   Social History Narrative    Family History  Problem Relation Age of Onset  . Heart disease  67  . CAD Brother   . Hypertension Brother     Allergies as of 03/16/2015 - Review Complete 03/16/2015  Allergen Reaction Noted  . Alphagan [brimonidine] Other (See Comments) 05/02/2013  . Azopt [brinzolamide]  05/02/2013  . Cefdinir Diarrhea 05/02/2013  . Macrobid [nitrofurantoin macrocrystal] Nausea And Vomiting 05/02/2013  . Sulfa antibiotics Nausea And Vomiting and Other (See Comments) 05/02/2013    No current facility-administered medications on file prior to encounter.   Current Outpatient Prescriptions on File Prior to Encounter  Medication Sig Dispense Refill  . amLODipine (  NORVASC) 2.5 MG tablet Take 1 tablet (2.5 mg total) by mouth daily. 30 tablet 3  . Ascorbic Acid (VITAMIN C) 1000 MG tablet Take 1,000 mg by mouth daily.    Marland Kitchen aspirin 81 MG tablet Take 81 mg by mouth at bedtime.     Marland Kitchen atorvastatin (LIPITOR) 10 MG tablet Take 10 mg by mouth daily.    . beta carotene 10000 UNIT capsule Take 10,000 Units by mouth daily.    . calcium citrate (CALCITRATE - DOSED IN MG ELEMENTAL CALCIUM) 950 MG tablet Take 1 tablet by mouth 2 (two) times daily.     . carvedilol (COREG) 12.5 MG tablet Take 1.5 tablets (18.75 mg total) by mouth 2 (two) times daily with a meal. 90 tablet 3  . cholecalciferol  (VITAMIN D) 1000 UNITS tablet Take 1,000 Units by mouth daily.    Marland Kitchen docusate sodium (COLACE) 100 MG capsule Take 1 capsule (100 mg total) by mouth 2 (two) times daily. 60 capsule 0  . furosemide (LASIX) 20 MG tablet Take 1 tablet (20 mg total) by mouth daily. 30 tablet 11  . glucosamine-chondroitin 500-400 MG tablet Take 1 tablet by mouth daily.     Marland Kitchen glyBURIDE (DIABETA) 5 MG tablet Take 5 mg by mouth 2 (two) times daily with a meal.     . iron polysaccharides (NIFEREX) 150 MG capsule Take 1 capsule (150 mg total) by mouth 2 (two) times daily. 60 capsule 2  . L-Methylfolate-B6-B12 (METANX PO) Take 1 tablet by mouth daily.     . Probiotic Product (PROBIOTIC DAILY PO) Take 1 tablet by mouth at bedtime. Florastor    . sitaGLIPtin (JANUVIA) 50 MG tablet Take 1 tablet (50 mg total) by mouth daily. (Patient taking differently: Take 50 mg by mouth 2 (two) times daily. ) 30 tablet 2  . Travoprost, BAK Free, (TRAVATAN) 0.004 % SOLN ophthalmic solution Place 1 drop into both eyes at bedtime.    . vitamin E 400 UNIT capsule Take 400 Units by mouth daily.    Marland Kitchen ACCU-CHEK AVIVA PLUS test strip     . conjugated estrogens (PREMARIN) vaginal cream Place vaginally 2 (two) times a week. (Patient not taking: Reported on 03/16/2015) 42.5 g 3  . Lancets (ACCU-CHEK MULTICLIX) lancets        REVIEW OF SYSTEMS: General: negative for chills, fever, night sweats or weight changes.  Cardiovascular: negative for chest pain, dyspnea on exertion, edema, orthopnea, palpitations, paroxysmal nocturnal dyspnea or shortness of breath Dermatological: negative for rash Respiratory: negative for cough or wheezing Urologic: negative for hematuria Abdominal: negative for nausea, vomiting, diarrhea, bright red blood per rectum, melena, or hematemesis Neurologic: negative for visual changes, syncope, or dizziness All other systems reviewed and are otherwise negative except as noted above.  PHYSICAL EXAMINATION: General: The  patient appears their stated age.  Vital signs are BP 96/66 mmHg  Pulse 85  Temp(Src) 98.1 F (36.7 C) (Oral)  Resp 19  Ht 5\' 2"  (1.575 m)  Wt 154 lb 1.6 oz (69.9 kg)  BMI 28.18 kg/m2  SpO2 98%  LMP 07/24/1977 Pulmonary: Respirations are non-labored, appears comfortable HEENT:  No gross abnormalities Abdomen: Soft and non-tender  Musculoskeletal: There are no major deformities.   Neurologic: No focal weakness or paresthesias are detected, Skin: There are no ulcer or rashes noted. Psychiatric: The patient has normal affect. Cardiovascular: There is a regular rate and rhythm without significant murmur appreciated..  Palpable femoral pulses  Diagnostic Studies: I have reviewed her heart catheterization  which revealed a high-grade left renal artery stenosis    Assessment:  Left renal artery stenosis Plan: I have been given clearance by her medical team to proceed with renal artery stenting.  I discussed that this should be able to be done with minimal contrast.  We discussed the potential benefits of stenting her 95% renal artery stenosis.  I also discussed that she may not notice any significant benefits.  However, given the degree of stenosis as well as her hypertension, renal failure, and flash pulmonary edema, I think this is the most prudent course of action.  This should be performed later today, hopefully between 1 and 2 PM.     V. Leia Alf, M.D. [facility] Office: [phone] Pager:  [phone]

## 2015-03-30 DIAGNOSIS — D509 Iron deficiency anemia, unspecified: Secondary | ICD-10-CM

## 2015-03-30 DIAGNOSIS — I429 Cardiomyopathy, unspecified: Secondary | ICD-10-CM

## 2015-03-30 LAB — GLUCOSE, CAPILLARY
Glucose-Capillary: 115 mg/dL — ABNORMAL HIGH (ref 70–99)
Glucose-Capillary: 376 mg/dL — ABNORMAL HIGH (ref 70–99)
Glucose-Capillary: 186 mg/dL — ABNORMAL HIGH (ref 70–99)
Glucose-Capillary: 91 mg/dL (ref 70–99)

## 2015-03-30 LAB — CBC
HEMATOCRIT: 21.7 % — AB (ref 36.0–46.0)
HEMATOCRIT: 23.3 % — AB (ref 36.0–46.0)
HEMOGLOBIN: 7.3 g/dL — AB (ref 12.0–15.0)
Hemoglobin: 7.7 g/dL — ABNORMAL LOW (ref 12.0–15.0)
MCH: 28.5 pg (ref 26.0–34.0)
MCH: 27.8 pg (ref 26.0–34.0)
MCHC: 33.6 g/dL (ref 30.0–36.0)
MCHC: 33 g/dL (ref 30.0–36.0)
MCV: 84.8 fL (ref 78.0–100.0)
MCV: 84.1 fL (ref 78.0–100.0)
Platelets: 358 10*3/uL (ref 150–400)
Platelets: 372 10*3/uL (ref 150–400)
RBC: 2.56 MIL/uL — ABNORMAL LOW (ref 3.87–5.11)
RBC: 2.77 MIL/uL — ABNORMAL LOW (ref 3.87–5.11)
RDW: 14.4 % (ref 11.5–15.5)
RDW: 14.2 % (ref 11.5–15.5)
WBC: 8.4 10*3/uL (ref 4.0–10.5)
WBC: 9.2 10*3/uL (ref 4.0–10.5)

## 2015-03-30 LAB — URINE MICROSCOPIC-ADD ON

## 2015-03-30 LAB — URINALYSIS, ROUTINE W REFLEX MICROSCOPIC
BILIRUBIN URINE: NEGATIVE
Glucose, UA: 250 mg/dL — AB
Ketones, ur: NEGATIVE mg/dL
Nitrite: NEGATIVE
PH: 5.5 (ref 5.0–8.0)
Protein, ur: 30 mg/dL — AB
SPECIFIC GRAVITY, URINE: 1.013 (ref 1.005–1.030)
Urobilinogen, UA: 0.2 mg/dL (ref 0.0–1.0)

## 2015-03-30 LAB — BASIC METABOLIC PANEL
ANION GAP: 10 (ref 5–15)
BUN: 43 mg/dL — ABNORMAL HIGH (ref 6–20)
CALCIUM: 8.2 mg/dL — AB (ref 8.9–10.3)
CO2: 27 mmol/L (ref 22–32)
CREATININE: 1.76 mg/dL — AB (ref 0.44–1.00)
Chloride: 90 mmol/L — ABNORMAL LOW (ref 101–111)
GFR, EST AFRICAN AMERICAN: 31 mL/min — AB (ref >60)
GFR, EST NON AFRICAN AMERICAN: 26 mL/min — AB (ref >60)
Glucose, Bld: 133 mg/dL — ABNORMAL HIGH (ref 70–99)
Potassium: 3.4 mmol/L — ABNORMAL LOW (ref 3.5–5.1)
SODIUM: 127 mmol/L — AB (ref 135–145)

## 2015-03-30 LAB — CARBOXYHEMOGLOBIN
Carboxyhemoglobin: 1.3 % (ref 0.5–1.5)
Methemoglobin: 1.3 % (ref 0.0–1.5)
O2 SAT: 76.6 %
Total hemoglobin: 7.3 g/dL — ABNORMAL LOW (ref 12.0–16.0)

## 2015-03-30 LAB — PREPARE RBC (CROSSMATCH)

## 2015-03-30 MED ORDER — FUROSEMIDE 10 MG/ML IJ SOLN
20.0000 mg | Freq: Once | INTRAMUSCULAR | Status: AC
Start: 2015-03-30 — End: 2015-03-30
  Administered 2015-03-30: 20 mg via INTRAVENOUS
  Filled 2015-03-30: qty 2

## 2015-03-30 MED ORDER — ACETAMINOPHEN 325 MG PO TABS
650.0000 mg | ORAL_TABLET | Freq: Once | ORAL | Status: AC
  Administered 2015-03-30: 650 mg via ORAL
  Filled 2015-03-30: qty 2

## 2015-03-30 MED ORDER — SODIUM CHLORIDE 0.9 % IV SOLN: Freq: Once | INTRAVENOUS | Status: DC

## 2015-03-30 MED ORDER — CEFTRIAXONE SODIUM IN DEXTROSE 20 MG/ML IV SOLN
1.0000 g | Freq: Every day | INTRAVENOUS | Status: DC
  Administered 2015-03-30 – 2015-03-31 (×2): 1 g via INTRAVENOUS
  Filled 2015-03-30 (×3): qty 50

## 2015-03-30 MED ORDER — DIPHENHYDRAMINE HCL 25 MG PO CAPS
25.0000 mg | ORAL_CAPSULE | Freq: Once | ORAL | Status: AC
  Administered 2015-03-30: 25 mg via ORAL
  Filled 2015-03-30: qty 1

## 2015-03-30 NOTE — Progress Notes (Signed)
    Subjective  - POD #1, s/p L renal artery stent  Says she feels better than she has in a while this am   Physical Exam:  Right groin soft  Breathing non-labored abd soft Feel warm and perfused     Assessment/Plan:  POD #1  Will check serum Cr this am.  Received 30cc of contrast for [proceedure Would like to add Plavix to med list if ok with primary team Will schedule for f/u duplex in 6 months Please call with questions  Branson Kranz, [place] 03/30/2015 7:55 AM --  Filed Vitals:   03/30/15 0504  BP: 112/49  Pulse: 90  Temp:   Resp: 14    Intake/Output Summary (Last 24 hours) at 03/30/15 0755 Last data filed at 03/29/15 2300  Gross per 24 hour  Intake 708.75 ml  Output    900 ml  Net -191.25 ml     Laboratory CBC    Component Value Date/Time   WBC 8.4 03/30/2015 0500   HGB 7.3* 03/30/2015 0500   HCT 21.7* 03/30/2015 0500   PLT 358 03/30/2015 0500    BMET    Component Value Date/Time   NA 125* 03/29/2015 0513   K 3.6 03/29/2015 0513   CL 87* 03/29/2015 0513   CO2 28 03/29/2015 0513   GLUCOSE 101* 03/29/2015 0513   BUN 57* 03/29/2015 0513   CREATININE 2.10* 03/29/2015 0513   CALCIUM 8.5* 03/29/2015 0513   GFRNONAA 21* 03/29/2015 0513   GFRAA 25* 03/29/2015 0513    COAG Lab Results  Component Value Date   INR 1.10 03/16/2015   INR 1.11 02/14/2015   No results found for: PTT  Antibiotics Anti-infectives    Start     Dose/Rate Route Frequency Ordered Stop   03/29/15 1400  vancomycin (VANCOCIN) IVPB 1000 mg/200 mL premix     1,000 mg 200 mL/hr over 60 Minutes Intravenous On call to O.R. 03/27/15 1204 03/30/15 1059       V. Leia Alf, M.D. [facility] Office: [phone] Pager:  [phone]

## 2015-03-30 NOTE — Progress Notes (Signed)
Called by the nurse that patient has bacteremia urea and pyuria in her urinalysis. On further questioning patient also has full smelling urine and burning on urination. Patient was noted to have blood pressure down trending from her usual 876O systolic to 115B - 26O systolic. While blood pressure 100/40 mmHg with heart rate 89 bpm  Patient was seen in the chair, comfortable at the time of my evaluation. Oh peripheral edema. No JVD however inferior vena cava appears to be slightly dilated with moderate decreased respiratory variation (patient examined while in the chair).  By the time of my evaluation patient received blood transfusion and Lasix was administered  Assessment: Urinary tract infection  Plan: Blood cultures 2, urinary cultures ceftriaxone 1 g every 24 hours daily first dose now Will hold all antihypertensive medications and torsemide for now

## 2015-03-30 NOTE — Progress Notes (Signed)
CARDIAC REHAB PHASE I   PRE:  Rate/Rhythm: ST 101  BP:  Supine:   Sitting: 113/57  Standing:    SaO2: 100 2lnc  MODE:  Ambulation: 150 ft with 4 standing rest breaks due to arm fatigue  POST:  Rate/Rhythm: 108  BP:  Supine:   Sitting: 156/82  Standing:    SaO2: ambulated on .5LNC in attempt to wean o2 sat 83% which increased to 95 on 2L  Pt ambulated with assist x 2 along with rolling walker.  Pt gait slow and pt required several standing breaks.  Pt back to chair, call bell in place, husband at bedside. Reviewed information in the heart failure booklet including exercise guidelines, dietary and fluid restriction for sodium and diabetes, when to call MD office and medication compliance. Pt will  benefit from having a rolling walker for home use for safety in the home and activity progression.  Pt would also benefit from home PT and RN for increased mobility and continued progression of activity and exercise  to be able to participate in outpatient cardiac rehab program and close monitoring of pt.  Questions answered, verbalized understanding. Cherre Huger, BSN [phone]

## 2015-03-30 NOTE — Progress Notes (Signed)
Advanced Heart Failure Rounding Note   Subjective:    81 y/o woman admitted 4/23 with recurrent HF. Had several episodes of flush pulmonary edema in setting of severe HTN.    Echo in March 2016; EF 30-35%  Underwent R/L cath on 4/25 with showed 60% LM (confirmed by IVUS) and 95% L renal artery stenosis. (R ok)  RA = 2 RV = 35/1/3 PA = 31/11 (21) PCW = 12 Fick cardiac output/index = 3.5/2.8 PVR = 2.5 WU SVR = 2121  FA sat = 94% PA sat = 55%, 57%  Feels good. Diuretics restarted as CVP starting to climb.  CVP 2.  I/Os inaccurate. SBP well controlled - maybe a bit low.No further urinary retention.  Creatinine improved to 1.78 (Baseline 1.5-1.8).   No pain, no back pain, no melena, no groin pain. Hg down to 7.3 from 8.2.    Objective:   Weight Range:  Vital Signs:   Temp:  [97.4 F (36.3 C)-98.2 F (36.8 C)] 98.1 F (36.7 C) (05/07 0400) Pulse Rate:  [81-126] 101 (05/07 0800) Resp:  [12-27] 20 (05/07 0800) BP: (103-138)/(27-77) 138/69 mmHg (05/07 0800) SpO2:  [90 %-100 %] 98 % (05/07 0800) Arterial Line BP: (137)/(48) 137/48 mmHg (05/06 1800) Weight:  [151 lb 3.8 oz (68.6 kg)-151 lb 10.8 oz (68.8 kg)] 151 lb 3.8 oz (68.6 kg) (05/07 0500) Last BM Date: 03/28/15  Weight change: Filed Weights   03/28/15 0500 03/29/15 1900 03/30/15 0500  Weight: 154 lb 1.6 oz (69.9 kg) 151 lb 10.8 oz (68.8 kg) 151 lb 3.8 oz (68.6 kg)    Intake/Output:   Intake/Output Summary (Last 24 hours) at 03/30/15 0949 Last data filed at 03/29/15 2300  Gross per 24 hour  Intake 708.75 ml  Output    500 ml  Net 208.75 ml     Physical Exam: General:  Elderly Sitting in chair HEENT: normal Neck: supple. JVP 6-7  Carotids 2+ bilat; no bruits. No lymphadenopathy or thryomegaly appreciated. Cor: PMI nondisplaced. Regular rate & rhythm. No rubs, gallops or murmurs. Lungs: clear Abdomen: soft, nontender, nondistended. No hepatosplenomegaly. No bruits or masses. Good bowel sounds. Extremities: no  cyanosis, clubbing, rash, trace-1+ edema, right groin soft Neuro: alert & orientedx3, cranial nerves grossly intact. moves all 4 extremities w/o difficulty. Affect pleasant  Telemetry: Sinus 90  Labs: Basic Metabolic Panel:  Recent Labs Lab 03/26/15 0500 03/27/15 0516 03/28/15 0432 03/29/15 0513 03/30/15 0914  NA 128* 124* 127* 125* 127*  K 4.0 3.8 3.6 3.6 3.4*  CL 86* 84* 87* 87* 90*  CO2 27 28 28 28 27   GLUCOSE 193* 171* 138* 101* 133*  BUN 69* 68* 59* 57* 43*  CREATININE 2.54* 2.36* 2.10* 2.10* 1.76*  CALCIUM 8.7* 8.4* 8.1* 8.5* 8.2*  PHOS 3.9 3.8 3.5  --   --     Liver Function Tests:  Recent Labs Lab 03/26/15 0500 03/27/15 0516 03/28/15 0432  ALBUMIN 3.2* 3.1* 3.0*   No results for input(s): LIPASE, AMYLASE in the last 168 hours. No results for input(s): AMMONIA in the last 168 hours.  CBC:  Recent Labs Lab 03/26/15 0500 03/27/15 0516 03/28/15 0432 03/29/15 0513 03/30/15 0500  WBC 10.1 7.9 9.5 9.1 8.4  HGB 9.3* 8.5* 8.8* 8.2* 7.3*  HCT 28.3* 25.4* 25.9* 24.5* 21.7*  MCV 85.8 84.4 84.6 83.9 84.8  PLT 496* 420* 427* 407* 358    Cardiac Enzymes: No results for input(s): CKTOTAL, CKMB, CKMBINDEX, TROPONINI in the last 168 hours.  BNP: BNP (  last 3 results)  Recent Labs  02/14/15 0247 03/16/15 0650  BNP 1346.9* 648.2*    ProBNP (last 3 results)  Recent Labs  03/01/15 0840  PROBNP 742.0*      Other results:  Imaging: No results found.   Medications:     Scheduled Medications: . amLODipine  10 mg Oral Daily  . antiseptic oral rinse  7 mL Mouth Rinse BID  . aspirin EC  81 mg Oral Daily  . atorvastatin  10 mg Oral Daily  . calcium citrate  1 tablet Oral Daily  . carvedilol  3.125 mg Oral BID WC  . cholecalciferol  1,000 Units Oral Daily  . docusate sodium  100 mg Oral BID  . doxazosin  1 mg Oral Daily  . [START ON 04/02/2015] ferumoxytol  510 mg Intravenous Once  . folic acid-pyridoxine-cyancobalamin  1 tablet Oral BID  .  heparin  5,000 Units Subcutaneous 3 times per day  . hydrALAZINE  100 mg Oral 3 times per day  . insulin aspart  0-5 Units Subcutaneous QHS  . insulin aspart  0-9 Units Subcutaneous TID WC  . insulin detemir  18 Units Subcutaneous Daily  . iron polysaccharides  150 mg Oral BID  . isosorbide mononitrate  30 mg Oral BID  . latanoprost  1 drop Both Eyes QHS  . linagliptin  5 mg Oral Daily  . sodium chloride  10-40 mL Intracatheter Q12H  . sodium chloride  3 mL Intravenous Q12H  . spironolactone  12.5 mg Oral Daily  . torsemide  20 mg Oral BID  . vancomycin  1,000 mg Intravenous On Call to OR  . vitamin C  1,000 mg Oral Daily  . vitamin E  400 Units Oral Daily    Infusions: . sodium chloride Stopped (03/29/15 2230)    PRN Medications: sodium chloride, acetaminophen, ALPRAZolam, alum & mag hydroxide-simeth, hydrALAZINE, magnesium hydroxide, nitroGLYCERIN, ondansetron (ZOFRAN) IV, sodium chloride, sodium chloride, sorbitol, zolpidem   Assessment:   1. Acute respiratory failure due flash pulmonary edema 2. Acute on chronic systolic HF, EF 37-48% 3. CKD stage 3 4. DM2 5. LBBB 6. HTN 7. Hypokalemia/hyponatremia 8. Acute on chronic renal failure, now stage 4 9. CAD with 60% LM lesion 10. Unilateral L renal artery stenosis 11. Urinary retention 12. Anemia   Plan/Discussion:    Feels well. Walked with cardiac rehab, moderate dyspnea. Much improved. HF stable. BP and renal function better. Creat now 1.7 .CVP low. Decreased demadex to 20 daily. Dr. B had long talk about need to take extra demadex if weight goes up after discharge.   Urinary retention improved with doxazosin. Will continue to follow.   Renal artery stent successful, Dr. Trula Slade. 30 ml of dye.   Anemia worse. Hg 7.3 from 8.2. No back pain, no groin pain, no melena. Need to repeat CBC, will order. Will monitor. Discussed potential PRBC transfusion if remains in the 7 range with CAD 60% LM. No chest pain, no angina.  On iron at home and here. Chronic issue.   Need to watch BP closely. If begins to fall would cut back hydralazine. No ACE/ARB due to renal failure  Continue PT.Cardiac rehab saw this AM  Hopefully home tomorrow if Hg stable.    Candee Furbish, MD  9:49 AM

## 2015-03-30 NOTE — Progress Notes (Signed)
    Hgg back at 7.7. Discussed with Dr. Haroldine Laws. Transfuse 2 units PRBC. Lasix 20 IV in between. Pre med tylenol and benadryl.  Candee Furbish, MD

## 2015-03-31 ENCOUNTER — Inpatient Hospital Stay (HOSPITAL_COMMUNITY): Payer: Medicare Other

## 2015-03-31 LAB — CK TOTAL AND CKMB (NOT AT ARMC)
CK TOTAL: 46 U/L (ref 38–234)
CK, MB: 6.5 ng/mL — ABNORMAL HIGH (ref 0.5–5.0)
Relative Index: INVALID (ref 0.0–2.5)

## 2015-03-31 LAB — TYPE AND SCREEN
ABO/RH(D): O POS
Antibody Screen: NEGATIVE
Unit division: 0
Unit division: 0

## 2015-03-31 LAB — BASIC METABOLIC PANEL
ANION GAP: 12 (ref 5–15)
BUN: 39 mg/dL — ABNORMAL HIGH (ref 6–20)
CHLORIDE: 88 mmol/L — AB (ref 101–111)
CO2: 27 mmol/L (ref 22–32)
CREATININE: 1.61 mg/dL — AB (ref 0.44–1.00)
Calcium: 7.9 mg/dL — ABNORMAL LOW (ref 8.9–10.3)
GFR, EST AFRICAN AMERICAN: 34 mL/min — AB (ref >60)
GFR, EST NON AFRICAN AMERICAN: 29 mL/min — AB (ref >60)
Glucose, Bld: 244 mg/dL — ABNORMAL HIGH (ref 70–99)
Potassium: 3.2 mmol/L — ABNORMAL LOW (ref 3.5–5.1)
Sodium: 127 mmol/L — ABNORMAL LOW (ref 135–145)

## 2015-03-31 LAB — CARBOXYHEMOGLOBIN
Carboxyhemoglobin: 1.4 % (ref 0.5–1.5)
Methemoglobin: 1 % (ref 0.0–1.5)
O2 Saturation: 73.2 %
Total hemoglobin: 10.8 g/dL — ABNORMAL LOW (ref 12.0–16.0)

## 2015-03-31 LAB — GLUCOSE, CAPILLARY
GLUCOSE-CAPILLARY: 175 mg/dL — AB (ref 70–99)
GLUCOSE-CAPILLARY: 122 mg/dL — AB (ref 70–99)
Glucose-Capillary: 111 mg/dL — ABNORMAL HIGH (ref 70–99)
Glucose-Capillary: 282 mg/dL — ABNORMAL HIGH (ref 70–99)

## 2015-03-31 LAB — CBC
HCT: 30.3 % — ABNORMAL LOW (ref 36.0–46.0)
Hemoglobin: 10.5 g/dL — ABNORMAL LOW (ref 12.0–15.0)
MCH: 29.3 pg (ref 26.0–34.0)
MCHC: 34.7 g/dL (ref 30.0–36.0)
MCV: 84.6 fL (ref 78.0–100.0)
Platelets: 331 K/uL (ref 150–400)
RBC: 3.58 MIL/uL — ABNORMAL LOW (ref 3.87–5.11)
RDW: 14 % (ref 11.5–15.5)
WBC: 10.5 K/uL (ref 4.0–10.5)

## 2015-03-31 LAB — TROPONIN I: TROPONIN I: 0.18 ng/mL — AB (ref <0.031)

## 2015-03-31 MED ORDER — HEPARIN SODIUM (PORCINE) 5000 UNIT/ML IJ SOLN
5000.0000 [IU] | Freq: Three times a day (TID) | INTRAMUSCULAR | Status: DC
  Administered 2015-03-31 – 2015-04-02 (×7): 5000 [IU] via SUBCUTANEOUS
  Filled 2015-03-31 (×7): qty 1

## 2015-03-31 MED ORDER — POTASSIUM CHLORIDE CRYS ER 20 MEQ PO TBCR
20.0000 meq | EXTENDED_RELEASE_TABLET | Freq: Once | ORAL | Status: AC
  Administered 2015-03-31: 20 meq via ORAL
  Filled 2015-03-31: qty 1

## 2015-03-31 MED ORDER — LEVALBUTEROL HCL 0.63 MG/3ML IN NEBU: 0.6300 mg | INHALATION_SOLUTION | Freq: Four times a day (QID) | RESPIRATORY_TRACT | Status: DC | PRN

## 2015-03-31 MED ORDER — FUROSEMIDE 10 MG/ML IJ SOLN
20.0000 mg | Freq: Once | INTRAMUSCULAR | Status: AC
  Administered 2015-03-31: 20 mg via INTRAVENOUS
  Filled 2015-03-31: qty 2

## 2015-03-31 MED ORDER — TORSEMIDE 20 MG PO TABS
20.0000 mg | ORAL_TABLET | Freq: Every day | ORAL | Status: DC
  Administered 2015-03-31: 20 mg via ORAL
  Filled 2015-03-31 (×2): qty 1

## 2015-03-31 NOTE — Progress Notes (Signed)
Subjective:    81 y/o woman admitted 4/23 with recurrent HF. Had several episodes of flush pulmonary edema in setting of severe HTN.    Echo in March 2016; EF 30-35%  Underwent R/L cath on 4/25 with showed 60% LM (confirmed by IVUS) and 95% L renal artery stenosis. (R ok)  RA = 2 RV = 35/1/3 PA = 31/11 (21) PCW = 12 Fick cardiac output/index = 3.5/2.8 PVR = 2.5 WU SVR = 2121  FA sat = 94% PA sat = 55%, 57%  Feels good. Diuretics restarted as CVP starting to climb.  CVP 2-3.   SBP well controlled - maybe a bit low.No further urinary retention but had pyuria and was started on IV rocephin on 03/30/15. No fevers. BP soft.  Creatinine improved to 1.76 (Baseline 1.5-1.8).   No pain, no back pain, no melena, no groin pain. Hg down to 7.3 from 8.2 and underwent 2 units of PRBC.   Objective:   Weight Range:  Vital Signs:   Temp:  [97.6 F (36.4 C)-98.4 F (36.9 C)] 98.3 F (36.8 C) (05/08 0737) Pulse Rate:  [86-93] 92 (05/08 0737) Resp:  [11-22] 18 (05/08 0737) BP: (93-133)/(29-70) 133/50 mmHg (05/08 0737) SpO2:  [85 %-100 %] 99 % (05/08 0737) Weight:  [153 lb 3.5 oz (69.5 kg)] 153 lb 3.5 oz (69.5 kg) (05/08 0337) Last BM Date: 03/29/15  Weight change: Filed Weights   03/29/15 1900 03/30/15 0500 03/31/15 0337  Weight: 151 lb 10.8 oz (68.8 kg) 151 lb 3.8 oz (68.6 kg) 153 lb 3.5 oz (69.5 kg)    Intake/Output:   Intake/Output Summary (Last 24 hours) at 03/31/15 0844 Last data filed at 03/31/15 0600  Gross per 24 hour  Intake   1102 ml  Output   1300 ml  Net   -198 ml     Physical Exam: General:  Elderly Sitting in chair HEENT: normal Neck: supple. JVP minimal. Carotids 2+ bilat; no bruits. No lymphadenopathy or thryomegaly appreciated. Cor: PMI nondisplaced. Regular rate & rhythm. No rubs, gallops or murmurs. Lungs: clear Abdomen: soft, nontender, nondistended. No hepatosplenomegaly. No bruits or masses. Good bowel sounds. Extremities: no cyanosis, clubbing, rash,  trace-no edema, right groin soft Neuro: alert & orientedx3, cranial nerves grossly intact. moves all 4 extremities w/o difficulty. Affect pleasant  Telemetry: Sinus 90  Labs: Basic Metabolic Panel:  Recent Labs Lab 03/26/15 0500 03/27/15 0516 03/28/15 0432 03/29/15 0513 03/30/15 0914  NA 128* 124* 127* 125* 127*  K 4.0 3.8 3.6 3.6 3.4*  CL 86* 84* 87* 87* 90*  CO2 27 28 28 28 27   GLUCOSE 193* 171* 138* 101* 133*  BUN 69* 68* 59* 57* 43*  CREATININE 2.54* 2.36* 2.10* 2.10* 1.76*  CALCIUM 8.7* 8.4* 8.1* 8.5* 8.2*  PHOS 3.9 3.8 3.5  --   --     Liver Function Tests:  Recent Labs Lab 03/26/15 0500 03/27/15 0516 03/28/15 0432  ALBUMIN 3.2* 3.1* 3.0*   No results for input(s): LIPASE, AMYLASE in the last 168 hours. No results for input(s): AMMONIA in the last 168 hours.  CBC:  Recent Labs Lab 03/28/15 0432 03/29/15 0513 03/30/15 0500 03/30/15 1123 03/31/15 0358  WBC 9.5 9.1 8.4 9.2 10.5  HGB 8.8* 8.2* 7.3* 7.7* 10.5*  HCT 25.9* 24.5* 21.7* 23.3* 30.3*  MCV 84.6 83.9 84.8 84.1 84.6  PLT 427* 407* 358 372 331    Cardiac Enzymes: No results for input(s): CKTOTAL, CKMB, CKMBINDEX, TROPONINI in the last 168 hours.  BNP:  BNP (last 3 results)  Recent Labs  02/14/15 0247 03/16/15 0650  BNP 1346.9* 648.2*    ProBNP (last 3 results)  Recent Labs  03/01/15 0840  PROBNP 742.0*      Other results:  Imaging: No results found.   Medications:     Scheduled Medications: . sodium chloride   Intravenous Once  . antiseptic oral rinse  7 mL Mouth Rinse BID  . aspirin EC  81 mg Oral Daily  . atorvastatin  10 mg Oral Daily  . calcium citrate  1 tablet Oral Daily  . carvedilol  3.125 mg Oral BID WC  . cefTRIAXone (ROCEPHIN)  IV  1 g Intravenous Daily  . cholecalciferol  1,000 Units Oral Daily  . docusate sodium  100 mg Oral BID  . [START ON 04/02/2015] ferumoxytol  510 mg Intravenous Once  . folic acid-pyridoxine-cyancobalamin  1 tablet Oral BID  .  heparin  5,000 Units Subcutaneous 3 times per day  . insulin aspart  0-5 Units Subcutaneous QHS  . insulin aspart  0-9 Units Subcutaneous TID WC  . insulin detemir  18 Units Subcutaneous Daily  . iron polysaccharides  150 mg Oral BID  . latanoprost  1 drop Both Eyes QHS  . linagliptin  5 mg Oral Daily  . sodium chloride  10-40 mL Intracatheter Q12H  . sodium chloride  3 mL Intravenous Q12H  . spironolactone  12.5 mg Oral Daily  . vitamin C  1,000 mg Oral Daily  . vitamin E  400 Units Oral Daily    Infusions: . sodium chloride Stopped (03/29/15 2230)    PRN Medications: sodium chloride, acetaminophen, ALPRAZolam, alum & mag hydroxide-simeth, hydrALAZINE, magnesium hydroxide, nitroGLYCERIN, ondansetron (ZOFRAN) IV, sodium chloride, sodium chloride, sorbitol, zolpidem   Assessment:   1. Acute respiratory failure due flash pulmonary edema 2. Acute on chronic systolic HF, EF 64-33% 3. CKD stage 3 4. DM2 5. LBBB 6. HTN 7. Hypokalemia/hyponatremia 8. Acute on chronic renal failure, now stage 4 9. CAD with 60% LM lesion 10. Unilateral L renal artery stenosis 11. Urinary retention 12. Anemia   Plan/Discussion:    Feels better today but had episode of pyuria, BP soft, was started on IV ceftriaxone on 5/7. [place] [employer] with cardiac rehab yesterday, moderate dyspnea. Much improved. HF stable. BP and renal function better. Creat now 1.7 .CVP low. Decreased demadex to 20 daily. Dr. B had long talk about need to take extra demadex if weight goes up after discharge.   Urinary retention improved with doxazosin. ?if infection playing a role. +Abx. Will continue to follow.   Renal artery stent successful, Dr. Trula Slade. 30 ml of dye.   Anemia improved after 2 units of PRBC. Hg 10.5 from 7.3. No back pain, no groin pain, no melena. Will monitor.  CAD 60% LM. No chest pain, no angina. On iron at home and here. Chronic issue.   Need to watch BP closely. Hydralazine being held. No ACE/ARB due to  renal failure  Continue PT.Cardiac rehab saw this AM  Hopefully home tomorrow if Hg stable, will give another day of IV abx.    Candee Furbish, MD  8:44 AM

## 2015-03-31 NOTE — Progress Notes (Signed)
MD aware of lab and xray results.

## 2015-04-01 ENCOUNTER — Other Ambulatory Visit: Payer: Self-pay

## 2015-04-01 ENCOUNTER — Encounter (HOSPITAL_COMMUNITY): Payer: Self-pay | Admitting: Surgery

## 2015-04-01 DIAGNOSIS — N390 Urinary tract infection, site not specified: Secondary | ICD-10-CM

## 2015-04-01 DIAGNOSIS — IMO0002 Reserved for concepts with insufficient information to code with codable children: Secondary | ICD-10-CM

## 2015-04-01 DIAGNOSIS — Z48812 Encounter for surgical aftercare following surgery on the circulatory system: Secondary | ICD-10-CM

## 2015-04-01 LAB — BASIC METABOLIC PANEL
Anion gap: 9 (ref 5–15)
BUN: 35 mg/dL — ABNORMAL HIGH (ref 6–20)
CALCIUM: 7.9 mg/dL — AB (ref 8.9–10.3)
CO2: 26 mmol/L (ref 22–32)
Chloride: 92 mmol/L — ABNORMAL LOW (ref 101–111)
Creatinine, Ser: 1.58 mg/dL — ABNORMAL HIGH (ref 0.44–1.00)
GFR calc Af Amer: 35 mL/min — ABNORMAL LOW (ref >60)
GFR calc non Af Amer: 30 mL/min — ABNORMAL LOW (ref >60)
GLUCOSE: 259 mg/dL — AB (ref 70–99)
Potassium: 3.6 mmol/L (ref 3.5–5.1)
SODIUM: 127 mmol/L — AB (ref 135–145)

## 2015-04-01 LAB — CBC
HCT: 32 % — ABNORMAL LOW (ref 36.0–46.0)
Hemoglobin: 10.9 g/dL — ABNORMAL LOW (ref 12.0–15.0)
MCH: 29 pg (ref 26.0–34.0)
MCHC: 34.1 g/dL (ref 30.0–36.0)
MCV: 85.1 fL (ref 78.0–100.0)
PLATELETS: 323 10*3/uL (ref 150–400)
RBC: 3.76 MIL/uL — ABNORMAL LOW (ref 3.87–5.11)
RDW: 14.1 % (ref 11.5–15.5)
WBC: 10.6 10*3/uL — ABNORMAL HIGH (ref 4.0–10.5)

## 2015-04-01 LAB — GLUCOSE, CAPILLARY
GLUCOSE-CAPILLARY: 148 mg/dL — AB (ref 70–99)
Glucose-Capillary: 149 mg/dL — ABNORMAL HIGH (ref 70–99)
Glucose-Capillary: 238 mg/dL — ABNORMAL HIGH (ref 70–99)

## 2015-04-01 LAB — CARBOXYHEMOGLOBIN
Carboxyhemoglobin: 1 % (ref 0.5–1.5)
Methemoglobin: 1 % (ref 0.0–1.5)
O2 Saturation: 73.3 %
TOTAL HEMOGLOBIN: 10.8 g/dL — AB (ref 12.0–16.0)

## 2015-04-01 LAB — TROPONIN I: Troponin I: 0.14 ng/mL — ABNORMAL HIGH (ref <0.031)

## 2015-04-01 MED ORDER — LISINOPRIL 5 MG PO TABS
5.0000 mg | ORAL_TABLET | Freq: Every day | ORAL | Status: DC
  Administered 2015-04-01: 5 mg via ORAL
  Filled 2015-04-01 (×2): qty 1

## 2015-04-01 MED ORDER — SODIUM CHLORIDE 0.9 % IV SOLN
510.0000 mg | INTRAVENOUS | Status: AC
  Administered 2015-04-01: 510 mg via INTRAVENOUS
  Filled 2015-04-01: qty 17

## 2015-04-01 MED ORDER — SORBITOL 70 % SOLN
30.0000 mL | Freq: Once | Status: AC
  Administered 2015-04-01: 30 mL via ORAL
  Filled 2015-04-01: qty 30

## 2015-04-01 MED ORDER — CIPROFLOXACIN HCL 500 MG PO TABS
500.0000 mg | ORAL_TABLET | Freq: Every day | ORAL | Status: DC
  Administered 2015-04-01 – 2015-04-02 (×2): 500 mg via ORAL
  Filled 2015-04-01 (×3): qty 1

## 2015-04-01 MED ORDER — CLOPIDOGREL BISULFATE 75 MG PO TABS
75.0000 mg | ORAL_TABLET | Freq: Every day | ORAL | Status: DC
  Administered 2015-04-01 – 2015-04-02 (×2): 75 mg via ORAL
  Filled 2015-04-01 (×2): qty 1

## 2015-04-01 MED FILL — Lidocaine HCl Local Preservative Free (PF) Inj 1%: INTRAMUSCULAR | Qty: 30 | Status: AC

## 2015-04-01 NOTE — Progress Notes (Signed)
Returned to try to ambulate but pt sts now her stomach is rolling from laxative and declines walking. Will f/u tomorrow. Yves Dill CES, ACSM 2:06 PM  04/01/2015

## 2015-04-01 NOTE — Progress Notes (Signed)
Inpatient Diabetes Program Recommendations  AACE/ADA: New Consensus Statement on Inpatient Glycemic Control (2013)  Target Ranges:  Prepandial:   less than 140 mg/dL      Peak postprandial:   less than 180 mg/dL (1-2 hours)      Critically ill patients:  140 - 180 mg/dL   Results for Katie Fuentes, Katie Fuentes (MRN [id number]) as of 04/01/2015 10:02  Ref. Range 03/31/2015 07:31 03/31/2015 11:51 03/31/2015 16:45 03/31/2015 21:42 04/01/2015 08:00  Glucose-Capillary Latest Ref Range: 70-99 mg/dL 111 (H) 282 (H) 175 (H) 122 (H) 149 (H)    Current orders for Inpatient glycemic control: Levemir 18 units daily, Novolog 0-9 units AC, Novolog 0-5 unit HS, Tradjenta 5 mg daily  Inpatient Diabetes Program Recommendations Correction (SSI): Please consider increasing Novolog correction to moderate scale.  Thanks, Barnie Alderman, RN, MSN, CCRN, CDE Diabetes Coordinator Inpatient Diabetes Program [phone] (Team Pager from [place] to [place]) [phone] ([facility] office) [phone] [facility] office)

## 2015-04-01 NOTE — Progress Notes (Signed)
Pt sts she feels poorly after receiving new meds. Sts her stomach and head feel funny. Declined walking right now. BP 122/54. Will f/u as time allows.  Yves Dill CES, ACSM 12:19 PM  04/01/2015

## 2015-04-01 NOTE — Clinical Documentation Improvement (Signed)
04/01/15  Dear Katie Fuentes  Presents with acute combined systolic and diastolic CHF.   Anemia worse documented in 03/30/15 progress note.  Hgb has dropped from 8.2 to 7.3 while in-house this admission  Serially being monitored  Transfused 2 units of PRBC's  Please clarify the type of anemia the patient has:   Acute Blood Loss Anemia  Acute on chronic blood loss anemia  Chronic blood loss anemia  Other Condition________________  Cannot Clinically Determine   Thank You, Zoila Shutter ,RN Clinical Documentation Specialist:  [place]

## 2015-04-01 NOTE — Progress Notes (Signed)
Advanced Heart Failure Rounding Note   Subjective:    81 y/o woman admitted 4/23 with recurrent HF. Had several episodes of flush pulmonary edema in setting of severe HTN.   Echo in March 2016; EF 30-35%  Underwent R/L cath on 4/25 with showed 60% LM (confirmed by IVUS) and 95% L renal artery stenosis. (R ok)  RA = 2 RV = 35/1/3 PA = 31/11 (21) PCW = 12 Fick cardiac output/index = 3.5/2.8 PVR = 2.5 WU SVR = 2121  FA sat = 94% PA sat = 55%, 57%  Post-cath course complicated by CIN, now improved (baseline Cr 1.5-1.8). Diuretics restarted as CVP starting to climb.S/p stent to left renal artery 03/29/15. S/p 2 U PRBC 03/30/15 for decreasing Hgb (7.3). No further urinary retention but had pyuria and was started on IV rocephin on 03/30/15.  Got 2u RBCs on Saturday. Had episode of chest pain from 9am yesterday to the afternoon. Troponins drawn but I do not see any accompanying notes. 0.18->0.14 (was 0.12 two weeks ago). Not worse with inspiration. Got 20mg  of IV Lasix with eventual improvement. Feels fine today. CVP 3.   Objective:   Weight Range:  Vital Signs:   Temp:  [97.6 F (36.4 C)-98.3 F (36.8 C)] 98.1 F (36.7 C) (05/09 0800) Pulse Rate:  [88-101] 101 (05/09 0817) Resp:  [15-22] 19 (05/09 0800) BP: (112-143)/(40-88) 123/88 mmHg (05/09 0817) SpO2:  [95 %-100 %] 98 % (05/09 0800) Weight:  [151 lb 14.4 oz (68.9 kg)] 151 lb 14.4 oz (68.9 kg) (05/09 0253) Last BM Date: 03/29/15  Weight change: Filed Weights   03/30/15 0500 03/31/15 0337 04/01/15 0253  Weight: 151 lb 3.8 oz (68.6 kg) 153 lb 3.5 oz (69.5 kg) 151 lb 14.4 oz (68.9 kg)    Intake/Output:   Intake/Output Summary (Last 24 hours) at 04/01/15 0857 Last data filed at 04/01/15 0855  Gross per 24 hour  Intake    950 ml  Output   1550 ml  Net   -600 ml     Physical Exam: General:  Well appearing. No resp difficulty HEENT: normal except alopecia noted Neck: supple. JVP .No lymphadenopathy or thryomegaly  appreciated. Cor: PMI nondisplaced. Regular rate & rhythm. No rubs, gallops or murmurs. Lungs: clear Abdomen: soft, nontender, nondistended. No hepatosplenomegaly. No bruits or masses. Good bowel sounds. Extremities: no cyanosis, clubbing, rash, edema Neuro: alert & orientedx3, cranial nerves grossly intact. moves all 4 extremities w/o difficulty. Affect pleasant  Telemetry: NSR/ST  Labs: Basic Metabolic Panel:  Recent Labs Lab 03/26/15 0500 03/27/15 0516 03/28/15 0432 03/29/15 0513 03/30/15 0914 03/31/15 1345  NA 128* 124* 127* 125* 127* 127*  K 4.0 3.8 3.6 3.6 3.4* 3.2*  CL 86* 84* 87* 87* 90* 88*  CO2 27 28 28 28 27 27   GLUCOSE 193* 171* 138* 101* 133* 244*  BUN 69* 68* 59* 57* 43* 39*  CREATININE 2.54* 2.36* 2.10* 2.10* 1.76* 1.61*  CALCIUM 8.7* 8.4* 8.1* 8.5* 8.2* 7.9*  PHOS 3.9 3.8 3.5  --   --   --     Liver Function Tests:  Recent Labs Lab 03/26/15 0500 03/27/15 0516 03/28/15 0432  ALBUMIN 3.2* 3.1* 3.0*   CBC:  Recent Labs Lab 03/29/15 0513 03/30/15 0500 03/30/15 1123 03/31/15 0358 04/01/15 0032  WBC 9.1 8.4 9.2 10.5 10.6*  HGB 8.2* 7.3* 7.7* 10.5* 10.9*  HCT 24.5* 21.7* 23.3* 30.3* 32.0*  MCV 83.9 84.8 84.1 84.6 85.1  PLT 407* 358 372 331 323  Cardiac Enzymes:  Recent Labs Lab 03/31/15 1345 03/31/15 1600 04/01/15 0001  CKTOTAL  --  46  --   CKMB  --  6.5*  --   TROPONINI 0.18*  --  0.14*    BNP: BNP (last 3 results)  Recent Labs  02/14/15 0247 03/16/15 0650  BNP 1346.9* 648.2*    ProBNP (last 3 results)  Recent Labs  03/01/15 0840  PROBNP 742.0*    Other results:  Imaging: Dg Chest Port 1 View  03/31/2015   CLINICAL DATA:  Short of breath.  Diabetes.  EXAM: PORTABLE CHEST - 1 VIEW  COMPARISON:  03/23/2015  FINDINGS: RIGHT upper extremity PICC is present with the tip in the mid to lower SVC. Small LEFT pleural effusion. RIGHT basilar atelectasis. Compared to the prior exam 4/30, pulmonary aeration is improved.  Cardiopericardial silhouette appears within normal limits for projection.Mediastinal contours are within normal limits.  IMPRESSION: Improved CHF with small LEFT pleural effusion and bilateral basilar atelectasis.   Electronically Signed   By: Dereck Ligas M.D.   On: 03/31/2015 14:11      Medications:     Scheduled Medications: . sodium chloride   Intravenous Once  . antiseptic oral rinse  7 mL Mouth Rinse BID  . aspirin EC  81 mg Oral Daily  . atorvastatin  10 mg Oral Daily  . calcium citrate  1 tablet Oral Daily  . carvedilol  3.125 mg Oral BID WC  . cefTRIAXone (ROCEPHIN)  IV  1 g Intravenous Daily  . cholecalciferol  1,000 Units Oral Daily  . docusate sodium  100 mg Oral BID  . [START ON 04/02/2015] ferumoxytol  510 mg Intravenous Once  . folic acid-pyridoxine-cyancobalamin  1 tablet Oral BID  . heparin subcutaneous  5,000 Units Subcutaneous 3 times per day  . insulin aspart  0-5 Units Subcutaneous QHS  . insulin aspart  0-9 Units Subcutaneous TID WC  . insulin detemir  18 Units Subcutaneous Daily  . iron polysaccharides  150 mg Oral BID  . latanoprost  1 drop Both Eyes QHS  . linagliptin  5 mg Oral Daily  . sodium chloride  10-40 mL Intracatheter Q12H  . sodium chloride  3 mL Intravenous Q12H  . spironolactone  12.5 mg Oral Daily  . torsemide  20 mg Oral Daily  . vitamin C  1,000 mg Oral Daily  . vitamin E  400 Units Oral Daily     Infusions: . sodium chloride Stopped (03/29/15 2230)     PRN Medications:  sodium chloride, acetaminophen, ALPRAZolam, alum & mag hydroxide-simeth, hydrALAZINE, levalbuterol, magnesium hydroxide, nitroGLYCERIN, ondansetron (ZOFRAN) IV, sodium chloride, sodium chloride, sorbitol, zolpidem   Assessment:   1. Acute respiratory failure due flash pulmonary edema 2. Acute on chronic systolic HF, EF 40-97% 3. CKD stage 3 c/b CIN this admission, now stage 4, baseline Cr 1.5-1.8 4. DM2 5. LBBB 6. HTN 7. Hypokalemia/hyponatremia 8. CAD  with 60% LM lesion, OK by IVUS 10. Unilateral L renal artery stenosis s/p L renal artery stent 03/29/15 11. Urinary retention 12. Anemia - possibly iron deficiency, s/p ferehem per renal this admission, s/p 2 U PRBCs 13. E Coli UTI - Ceftriaxone started 03/30/15  Plan/Discussion:    Anemia improved after 2 units of PRBC. Hg 10.9 from 7.3. No back pain, no groin pain, no melena. Will monitor. CAD 60% LM. Troponins drawn yesterday, I think due to episode of CP. This improved with additional Lasix. . Asymptomatic today. Continue medical therapy - will  discuss additional thoughts with MD. EKG not done yesterday but this AM shows continued LBBB without acute change from prior. Continue aspirin, statin, BB. With elevation in HR will ask MD if we need to increase Coreg. On iron at home and here. Chronic issue. Plan was to receive 2 doses of Fereheme this admission - next dose due today. Since Hgb is stable, will ask MD if OK to add Plavix for renal stent. (Per vascular note 5/7.)  Check BMET this AM. Cr was stable yesterday. Hydralazine being held. No ACEI/ARB due to renal failure although we might consider as outpatient given that she's tolerating spiro.  Urinary retention improved with doxazosin. ?if infection playing a role. +Abx. Sensitivities not back yet. Consider Keflex at d/c. We can change to other agent if it's resistant to cephalosporins.   Sugars running intermittently higher this admission but Metformin/Januvia combo held. She was maintained on Trajenta as inpatient. Lememir is new. Will likely need to hold metformin at D/C due to renal insufficiency.  Length of Stay: [place] PA-C 04/01/2015, 8:57 AM  Advanced Heart Failure Team Pager [phone] (M-F; 7a - 4p)  Please contact [facility] for night-coverage after hours (4p -7a ) and weekends on [email or web address]  Patient seen and examined with Melina Copa, PA-C. We discussed all aspects of the encounter. I agree with the assessment and plan  as stated above.   Continues to improve. Still with a few outstanding issues. I suspect CP yesterday related to volume overload. Improved with IV lasix. CVP now 3. Will hold torsemide today. Resume tomorrow. Other things for today.   - check BMET - start lisinopril 5 daily and watch renal function closely - start Plavix 75mg  daily for renal artery stent - switch ceftriaxone to po cipro for 7 total days (watch sensitivities) - sorbitol for constipation - continue PT  Hopefully home in am.   Aminata Buffalo,MD 10:10 AM

## 2015-04-01 NOTE — Consult Note (Signed)
   [facility] CM Inpatient Consult   04/01/2015  Katie Fuentes 1934-06-07 [id number] Follow up to referral.  Patient evaluated for community based chronic disease management services with [facility] as a benefit of patient's [facility] Medicare Insurance. Spoke with patient and her husband, "Gershon Mussel"  at bedside to explain [facility] services. Consent form signed.  Patient will receive post discharge calls and will be evaluated for monthly home visits for assessments and disease process education.  Left contact information and [facility] literature at bedside with them. Made Inpatient Case Manager aware that [facility] following. Of note, [facility] Management services does not replace or interfere with any services that are arranged by inpatient case management or social work.  For additional questions or referrals please contact:   Natividad Brood, RN BSN [facility] Liaison  [phone] business mobile phone

## 2015-04-01 NOTE — Procedures (Signed)
RT assess pt, bipap not needed at this time.

## 2015-04-01 NOTE — Care Management Note (Signed)
Case Management Note  Patient Details  Name: Katie Fuentes MRN: [id number] Date of Birth: [birth year]  Subjective/Objective:                    Action/Plan:   Expected Discharge Date: 04/02/15                 Expected Discharge Plan:  [name]  In-House Referral:     Discharge planning Services     Post Acute Care Choice:  Resumption of Svcs/PTA Provider, Durable Medical Equipment Choice offered to:  Patient  DME Arranged:  Walker rolling with seat, Bedside commode DME Agency:  [place]:  RN, PT, Disease Management [place]:  [place]  Status of Service:  completed   Medicare Important Message Given:  Yes Date Medicare IM Given:  03/28/15 Medicare IM give by:  debbie dowell rn,bsn Date Additional Medicare IM Given:    Additional Medicare Important Message give by:     If discussed at [place] of Stay Meetings, dates discussed:    Additional Comments:      Patient's husband was provided with orders for DME and went to the [facility] to obtain per Brenton Grills, [facility] DME Liaison.Loletha Grayer, Beverely Pace, RN 04/01/2015, 3:47 PM

## 2015-04-01 NOTE — Progress Notes (Signed)
Physical Therapy Treatment Patient Details Name: Katie Fuentes MRN: [id number] DOB: 1934-01-02 Today's Date: 17-Apr-2015    History of Present Illness Admitted 4/23 with recurrent HF. Had several episodes of flash pulmonary edema in setting of severe HTN    PT Comments    Pt progressing very well with gait today with sats 93-99% on RA throughout with HR 91-111. Pt encouraged to continue increasing gait, HEP and function throughout the day for safe return home with spouse. Will continue to follow.   Follow Up Recommendations  Home health PT;Supervision for mobility/OOB     Equipment Recommendations  Rolling walker with 5" wheels;3in1 (PT)    Recommendations for Other Services       Precautions / Restrictions Precautions Precautions: Fall    Mobility  Bed Mobility               General bed mobility comments: in chair beginning and end of session  Transfers Overall transfer level: Needs assistance     Sit to Stand: Supervision         General transfer comment: cues for hand placement to and from recliner and [facility]  Ambulation/Gait Ambulation/Gait assistance: Supervision Ambulation Distance (Feet): 300 Feet Assistive device: Rolling walker (2 wheeled) Gait Pattern/deviations: Step-through pattern;Decreased stride length     General Gait Details: cues for posture, position in RW and encouragement to increase distance   Stairs            Wheelchair Mobility    Modified Rankin (Stroke Patients Only)       Balance                                    Cognition Arousal/Alertness: Awake/alert Behavior During Therapy: WFL for tasks assessed/performed Overall Cognitive Status: Within Functional Limits for tasks assessed                      Exercises General Exercises - Lower Extremity Long Arc Quad: AROM;Seated;Both;20 reps Hip ABduction/ADduction: AROM;Seated;Both;20 reps Hip Flexion/Marching: AROM;Seated;Both;20  reps Toe Raises: AROM;Seated;Both;20 reps Heel Raises: AROM;Seated;Both;20 reps    General Comments        Pertinent Vitals/Pain Pain Assessment: No/denies pain    Home Living                      Prior Function            PT Goals (current goals can now be found in the care plan section) Progress towards PT goals: Progressing toward goals    Frequency       PT Plan Current plan remains appropriate    Co-evaluation             End of Session   Activity Tolerance: Patient tolerated treatment well Patient left: in chair;with call bell/phone within reach     Time: 0731-0757 PT Time Calculation (min) (ACUTE ONLY): 26 min  Charges:  $Gait Training: 8-22 mins $Therapeutic Exercise: 8-22 mins                    G Codes:      Melford Aase 04-17-2015, 9:21 AM Elwyn Reach, [place]

## 2015-04-02 ENCOUNTER — Encounter (HOSPITAL_COMMUNITY): Payer: Self-pay | Admitting: Emergency Medicine

## 2015-04-02 ENCOUNTER — Telehealth: Payer: Self-pay | Admitting: Surgery

## 2015-04-02 DIAGNOSIS — Z7902 Long term (current) use of antithrombotics/antiplatelets: Secondary | ICD-10-CM

## 2015-04-02 DIAGNOSIS — I447 Left bundle-branch block, unspecified: Secondary | ICD-10-CM | POA: Diagnosis present

## 2015-04-02 DIAGNOSIS — N183 Chronic kidney disease, stage 3 (moderate): Secondary | ICD-10-CM | POA: Diagnosis not present

## 2015-04-02 DIAGNOSIS — B9620 Unspecified Escherichia coli [E. coli] as the cause of diseases classified elsewhere: Secondary | ICD-10-CM | POA: Diagnosis not present

## 2015-04-02 DIAGNOSIS — E785 Hyperlipidemia, unspecified: Secondary | ICD-10-CM | POA: Diagnosis present

## 2015-04-02 DIAGNOSIS — I5022 Chronic systolic (congestive) heart failure: Secondary | ICD-10-CM | POA: Diagnosis not present

## 2015-04-02 DIAGNOSIS — N390 Urinary tract infection, site not specified: Secondary | ICD-10-CM | POA: Diagnosis present

## 2015-04-02 DIAGNOSIS — Z87891 Personal history of nicotine dependence: Secondary | ICD-10-CM

## 2015-04-02 DIAGNOSIS — R0602 Shortness of breath: Secondary | ICD-10-CM | POA: Diagnosis not present

## 2015-04-02 DIAGNOSIS — E1165 Type 2 diabetes mellitus with hyperglycemia: Secondary | ICD-10-CM | POA: Diagnosis not present

## 2015-04-02 DIAGNOSIS — I214 Non-ST elevation (NSTEMI) myocardial infarction: Principal | ICD-10-CM | POA: Diagnosis present

## 2015-04-02 DIAGNOSIS — I429 Cardiomyopathy, unspecified: Secondary | ICD-10-CM | POA: Diagnosis present

## 2015-04-02 DIAGNOSIS — I129 Hypertensive chronic kidney disease with stage 1 through stage 4 chronic kidney disease, or unspecified chronic kidney disease: Secondary | ICD-10-CM | POA: Diagnosis not present

## 2015-04-02 DIAGNOSIS — E1122 Type 2 diabetes mellitus with diabetic chronic kidney disease: Secondary | ICD-10-CM | POA: Diagnosis not present

## 2015-04-02 DIAGNOSIS — J90 Pleural effusion, not elsewhere classified: Secondary | ICD-10-CM | POA: Diagnosis not present

## 2015-04-02 DIAGNOSIS — H409 Unspecified glaucoma: Secondary | ICD-10-CM | POA: Diagnosis present

## 2015-04-02 DIAGNOSIS — J181 Lobar pneumonia, unspecified organism: Secondary | ICD-10-CM | POA: Diagnosis not present

## 2015-04-02 DIAGNOSIS — R0789 Other chest pain: Secondary | ICD-10-CM | POA: Diagnosis not present

## 2015-04-02 DIAGNOSIS — E871 Hypo-osmolality and hyponatremia: Secondary | ICD-10-CM | POA: Diagnosis present

## 2015-04-02 DIAGNOSIS — Z7982 Long term (current) use of aspirin: Secondary | ICD-10-CM

## 2015-04-02 LAB — URINE CULTURE
Colony Count: >=100000
Special Requests: NORMAL

## 2015-04-02 LAB — BASIC METABOLIC PANEL
Anion gap: 12 (ref 5–15)
BUN: 36 mg/dL — ABNORMAL HIGH (ref 6–20)
CHLORIDE: 93 mmol/L — AB (ref 101–111)
CO2: 26 mmol/L (ref 22–32)
Calcium: 8.3 mg/dL — ABNORMAL LOW (ref 8.9–10.3)
Creatinine, Ser: 1.68 mg/dL — ABNORMAL HIGH (ref 0.44–1.00)
GFR calc non Af Amer: 28 mL/min — ABNORMAL LOW (ref >60)
GFR, EST AFRICAN AMERICAN: 32 mL/min — AB (ref >60)
Glucose, Bld: 96 mg/dL (ref 70–99)
POTASSIUM: 3.6 mmol/L (ref 3.5–5.1)
Sodium: 131 mmol/L — ABNORMAL LOW (ref 135–145)

## 2015-04-02 LAB — CLOSTRIDIUM DIFFICILE BY PCR: CDIFFPCR: NEGATIVE

## 2015-04-02 LAB — CBC
HCT: 31.6 % — ABNORMAL LOW (ref 36.0–46.0)
Hemoglobin: 10.4 g/dL — ABNORMAL LOW (ref 12.0–15.0)
MCH: 28.2 pg (ref 26.0–34.0)
MCHC: 32.9 g/dL (ref 30.0–36.0)
MCV: 85.6 fL (ref 78.0–100.0)
Platelets: 385 10*3/uL (ref 150–400)
RBC: 3.69 MIL/uL — ABNORMAL LOW (ref 3.87–5.11)
RDW: 14.6 % (ref 11.5–15.5)
WBC: 9.5 10*3/uL (ref 4.0–10.5)

## 2015-04-02 LAB — GLUCOSE, CAPILLARY
Glucose-Capillary: 219 mg/dL — ABNORMAL HIGH (ref 70–99)
Glucose-Capillary: 105 mg/dL — ABNORMAL HIGH (ref 70–99)
Glucose-Capillary: 250 mg/dL — ABNORMAL HIGH (ref 70–99)

## 2015-04-02 LAB — CARBOXYHEMOGLOBIN
CARBOXYHEMOGLOBIN: 1.3 % (ref 0.5–1.5)
Carboxyhemoglobin: 1.1 % (ref 0.5–1.5)
METHEMOGLOBIN: 1.5 % (ref 0.0–1.5)
Methemoglobin: 1.4 % (ref 0.0–1.5)
O2 Saturation: 55.4 %
O2 Saturation: 76.5 %
Total hemoglobin: 9.4 g/dL — ABNORMAL LOW (ref 12.0–16.0)
Total hemoglobin: 10 g/dL — ABNORMAL LOW (ref 12.0–16.0)

## 2015-04-02 MED ORDER — LISINOPRIL 5 MG PO TABS: 5.0000 mg | ORAL_TABLET | Freq: Every day | ORAL | Status: DC

## 2015-04-02 MED ORDER — POTASSIUM CHLORIDE CRYS ER 20 MEQ PO TBCR
20.0000 meq | EXTENDED_RELEASE_TABLET | Freq: Once | ORAL | Status: AC
Start: 2015-04-02 — End: 2015-04-02
  Administered 2015-04-02: 20 meq via ORAL
  Filled 2015-04-02: qty 1

## 2015-04-02 MED ORDER — CIPROFLOXACIN HCL 500 MG PO TABS: 500.0000 mg | ORAL_TABLET | Freq: Every day | ORAL | Status: DC

## 2015-04-02 MED ORDER — POTASSIUM CHLORIDE ER 20 MEQ PO TBCR: 20.0000 meq | EXTENDED_RELEASE_TABLET | Freq: Every day | ORAL | Status: DC

## 2015-04-02 MED ORDER — SPIRONOLACTONE 25 MG PO TABS: 12.5000 mg | ORAL_TABLET | Freq: Every day | ORAL | Status: DC

## 2015-04-02 MED ORDER — CARVEDILOL 3.125 MG PO TABS: 3.1250 mg | ORAL_TABLET | Freq: Two times a day (BID) | ORAL | Status: DC

## 2015-04-02 MED ORDER — TORSEMIDE 20 MG PO TABS: 20.0000 mg | ORAL_TABLET | Freq: Every day | ORAL | Status: DC

## 2015-04-02 MED ORDER — CLOPIDOGREL BISULFATE 75 MG PO TABS: 75.0000 mg | ORAL_TABLET | Freq: Every day | ORAL | Status: AC

## 2015-04-02 NOTE — Procedures (Signed)
CVP changed per protocol.  No complications noted.

## 2015-04-02 NOTE — Progress Notes (Signed)
NCM spoke to pt and gave permission to speak to husband, Mr Drab. States he will pick up [employer] with seat from [facility] store today, they will be in stock. He plans to purchase a regular RW out of pocket. Wife believes she can manage that one better in the house, and husband feels the [employer] with seat will work better when she goes out. Contacted [facility] to make aware of dc home today. Jonnie Finner RN CCM Case Mgmt phone [phone]

## 2015-04-02 NOTE — Progress Notes (Signed)
Advanced Heart Failure Rounding Note   Subjective:    81 y/o woman admitted 4/23 with recurrent HF. Had several episodes of flush pulmonary edema in setting of severe HTN.   Echo in March 2016; EF 30-35%  Underwent R/L cath on 4/25 with showed 60% LM (confirmed by IVUS) and 95% L renal artery stenosis. (R ok)  RA = 2 RV = 35/1/3 PA = 31/11 (21)  PCW = 12 Fick cardiac output/index = 3.5/2.8 PVR = 2.5 WU SVR = 2121  FA sat = 94% PA sat = 55%, 57%  Post-cath course complicated by CIN, now improved (baseline Cr 1.5-1.8). Diuretics restarted as CVP starting to climb.S/p stent to left renal artery 03/29/15. S/p 2 U PRBC 5/7/166 for decreasing Hgb (7.3).No further urinary retention but had pyuria and was started on IV rocephin on 03/30/15.  Yesterday diuretics held and she was started on lisinopril and plavix.   Denies SOB/CP.   Objective:   Weight Range:  Vital Signs:   Temp:  [97.4 F (36.3 C)-98.1 F (36.7 C)] 97.5 F (36.4 C) (05/10 0738) Pulse Rate:  [81-101] 92 (05/10 0738) Resp:  [12-25] 25 (05/10 0738) BP: (97-137)/(42-88) 120/59 mmHg (05/10 0738) SpO2:  [95 %-98 %] 97 % (05/10 0738) Weight:  [153 lb 14.1 oz (69.8 kg)] 153 lb 14.1 oz (69.8 kg) (05/10 0123) Last BM Date: 04/01/15  Weight change: Filed Weights   03/31/15 0337 04/01/15 0253 04/02/15 0123  Weight: 153 lb 3.5 oz (69.5 kg) 151 lb 14.4 oz (68.9 kg) 153 lb 14.1 oz (69.8 kg)    Intake/Output:   Intake/Output Summary (Last 24 hours) at 04/02/15 0749 Last data filed at 04/01/15 2156  Gross per 24 hour  Intake    645 ml  Output    150 ml  Net    495 ml     Physical Exam: CVP 2 General:  Well appearing. No resp difficulty. Sitting in the chair HEENT: normal except alopecia noted Neck: supple. JVP flat  .No lymphadenopathy or thryomegaly appreciated. Cor: PMI nondisplaced. Regular rate & rhythm. No rubs, gallops or murmurs. Lungs: clear Abdomen: soft, nontender, nondistended. No hepatosplenomegaly.  No bruits or masses. Good bowel sounds. Extremities: no cyanosis, clubbing, rash, edema Neuro: alert & orientedx3, cranial nerves grossly intact. moves all 4 extremities w/o difficulty. Affect pleasant  Telemetry: NSR 80s  Labs: Basic Metabolic Panel:  Recent Labs Lab 03/27/15 0516 03/28/15 0432 03/29/15 0513 03/30/15 0914 03/31/15 1345 04/01/15 1040 04/02/15 0240  NA 124* 127* 125* 127* 127* 127* 131*  K 3.8 3.6 3.6 3.4* 3.2* 3.6 3.6  CL 84* 87* 87* 90* 88* 92* 93*  CO2 28 28 28 27 27 26 26   GLUCOSE 171* 138* 101* 133* 244* 259* 96  BUN 68* 59* 57* 43* 39* 35* 36*  CREATININE 2.36* 2.10* 2.10* 1.76* 1.61* 1.58* 1.68*  CALCIUM 8.4* 8.1* 8.5* 8.2* 7.9* 7.9* 8.3*  PHOS 3.8 3.5  --   --   --   --   --     Liver Function Tests:  Recent Labs Lab 03/27/15 0516 03/28/15 0432  ALBUMIN 3.1* 3.0*   CBC:  Recent Labs Lab 03/30/15 0500 03/30/15 1123 03/31/15 0358 04/01/15 0032 04/02/15 0240  WBC 8.4 9.2 10.5 10.6* 9.5  HGB 7.3* 7.7* 10.5* 10.9* 10.4*  HCT 21.7* 23.3* 30.3* 32.0* 31.6*  MCV 84.8 84.1 84.6 85.1 85.6  PLT 358 372 331 323 385    Cardiac Enzymes:  Recent Labs Lab 03/31/15 1345 03/31/15 1600 04/01/15  0001  CKTOTAL  --  46  --   CKMB  --  6.5*  --   TROPONINI 0.18*  --  0.14*    BNP: BNP (last 3 results)  Recent Labs  02/14/15 0247 03/16/15 0650  BNP 1346.9* 648.2*    ProBNP (last 3 results)  Recent Labs  03/01/15 0840  PROBNP 742.0*    Other results:  Imaging: Dg Chest Port 1 View  03/31/2015   CLINICAL DATA:  Short of breath.  Diabetes.  EXAM: PORTABLE CHEST - 1 VIEW  COMPARISON:  03/23/2015  FINDINGS: RIGHT upper extremity PICC is present with the tip in the mid to lower SVC. Small LEFT pleural effusion. RIGHT basilar atelectasis. Compared to the prior exam 4/30, pulmonary aeration is improved. Cardiopericardial silhouette appears within normal limits for projection.Mediastinal contours are within normal limits.  IMPRESSION:  Improved CHF with small LEFT pleural effusion and bilateral basilar atelectasis.   Electronically Signed   By: Dereck Ligas M.D.   On: 03/31/2015 14:11     Medications:     Scheduled Medications: . sodium chloride   Intravenous Once  . antiseptic oral rinse  7 mL Mouth Rinse BID  . aspirin EC  81 mg Oral Daily  . atorvastatin  10 mg Oral Daily  . calcium citrate  1 tablet Oral Daily  . carvedilol  3.125 mg Oral BID WC  . cholecalciferol  1,000 Units Oral Daily  . ciprofloxacin  500 mg Oral Q breakfast  . clopidogrel  75 mg Oral Daily  . docusate sodium  100 mg Oral BID  . folic acid-pyridoxine-cyancobalamin  1 tablet Oral BID  . heparin subcutaneous  5,000 Units Subcutaneous 3 times per day  . insulin aspart  0-5 Units Subcutaneous QHS  . insulin aspart  0-9 Units Subcutaneous TID WC  . insulin detemir  18 Units Subcutaneous Daily  . iron polysaccharides  150 mg Oral BID  . latanoprost  1 drop Both Eyes QHS  . linagliptin  5 mg Oral Daily  . lisinopril  5 mg Oral Daily  . sodium chloride  10-40 mL Intracatheter Q12H  . sodium chloride  3 mL Intravenous Q12H  . spironolactone  12.5 mg Oral Daily  . vitamin C  1,000 mg Oral Daily  . vitamin E  400 Units Oral Daily    Infusions: . sodium chloride Stopped (03/29/15 2230)    PRN Medications: sodium chloride, acetaminophen, ALPRAZolam, alum & mag hydroxide-simeth, hydrALAZINE, levalbuterol, magnesium hydroxide, nitroGLYCERIN, ondansetron (ZOFRAN) IV, sodium chloride, sodium chloride, sorbitol, zolpidem   Assessment:   1. Acute respiratory failure due flash pulmonary edema 2. Acute on chronic systolic HF, EF 78-29% 3. CKD stage 3 c/b CIN this admission, now stage 4, baseline Cr 1.5-1.8 4. DM2 5. LBBB 6. HTN 7. Hypokalemia/hyponatremia 8. CAD with 60% LM lesion, OK by IVUS 10. Unilateral L renal artery stenosis s/p L renal artery stent 03/29/15 11. Urinary retention 12. Anemia - possibly iron deficiency, s/p ferehem per  renal this admission, s/p 2 U PRBCs 13. E Coli UTI - Ceftriaxone started 03/30/15  Plan/Discussion:    Renal function ok with addition of ace. Hold diuretics today (CVP 2)  then start torsemide 20 mg every other day with 20 meq of K on torsemide days.  Continue spiro 12.5 mg daily, lisinopril 5 mg daily, and. carvedilol 3.125 mg twice a day,   Continue plavix for renal artery stent.   UTI- Stop rocpehin. Start cipro 500 mg daily x 3 days.  HH set up with [facility]. Follow up in HF clinic next week.   Length of Stay: 17  CLEGG,AMY NP-C  8:05 AM  Advanced Heart Failure Team Pager [phone] (M-F; 7a - 4p)  Please contact [facility] for night-coverage after hours (4p -7a ) and weekends on [email or web address]  Patient seen and examined with Darrick Grinder, NP. We discussed all aspects of the encounter. I agree with the assessment and plan as stated above.   She is much improved. [place] for discharge later today. Will hold torsemide and lisinopril today as BP is soft after multiple post-sorbitol bowel movements.   F/u in HF Clinic next week. Reinforced need for daily weights and reviewed use of sliding scale diuretics.   Bensimhon, Daniel,MD 10:03 AM

## 2015-04-02 NOTE — ED Notes (Signed)
Pt. reports SOB and chest tightness onset this evening , denies cough or chest pain , pt. was just discharged from the hospital this afternoon.

## 2015-04-02 NOTE — Telephone Encounter (Signed)
-----   Message from Denman George, RN sent at 04/01/2015  9:45 AM EDT ----- Regarding: Zigmund Daniel log; also change the f/u appt. in 6 mos to be with [facility] Note that Dr. Trula Slade sent prev. msg to sched. f/u with NP; now wants it to be with him. ----- Message -----    From: Serafina Mitchell, MD    Sent: 03/30/2015   8:03 AM      To: [facility] Charge Pool  03/30/2015:  Follow up hospital visit  Schedule f/u in 6 months with renal duplex for me

## 2015-04-02 NOTE — Discharge Summary (Signed)
Advanced Heart Failure Team  Discharge Summary   Patient ID: Katie Fuentes MRN: [id number], DOB/AGE: 07/16/1934 81 y.o. Admit date: 03/16/2015 D/C date:     04/02/2015   Primary Discharge Diagnoses:  1. Acute respiratory failure due flash pulmonary edema 2. Acute on chronic systolic HF, EF 32-35% 3. CKD stage 3 c/b CIN this admission, now stage 4, baseline Cr 1.5-1.8 4. DM2 5. LBBB 6. HTN 7. Hypokalemia/hyponatremia 8. CAD with 60% LM lesion, OK by IVUS 10. Unilateral L renal artery stenosis s/p L renal artery stent 03/29/15 11. Urinary retention 12. Anemia - possibly iron deficiency, s/p ferehem per renal this admission, s/p 2 U PRBCs 13. E Coli UTI - Ceftriaxone started 03/30/15   Hospital Course:  ELIANYS Fuentes is a 81 y.o. female with a history of hypertension, hyperlipidemia, diabetes mellitus type 2 who presented to the ER 02/14/15 because of worsening shortness of breath requiring Bipap. She had an elevated Troponin of 1.18 and a BNP of 742. Echo showed global HK with an EF of 30-35%. Her SCr was 1.87. Myoview 02/16/15 showed no ischemia but she did have an AS WMA. She was discharged 02/18/15 after a 2 lb diuresis.    She returned to [facility] ED on April 23rd with increased dyspnea. She required short term bipap and diuresed with IV lasix. She was admitted to stepdown due to concern for low output heart failure. Post cath she rise in renal function that was thought to be contrast induced. She had unstable angina and had RHC/LHC as noted below. Dr Tamala Julian was consulted and he performed left main intravascular ultrasound due to heavily calcified left main. At the conclusion the left main did not demonstrate a significant region of stenosis. Medical management was recommended.   On April 27th she devleoped flash pulmonary edema and required BiPap, IV lasix, and IV nitro. As she improved BiPaP was weaned off as well as IV nitro. She was diuresed with high dose IV lasix and transitioned  to torsemide 20 mg daily. Overall she diuresed seven pounds.    Due to worsening renal function and L RAS, nephrology was consulted, with recommendations for stent to L RAS once stable. Vascular Surgery consulted for possible stent however this was delayed until renal function improved. She eventually had L renal artery stent placed on May 6th. Post stent placement hemoglobin went down from 10.4 to 7.3 and she was given 2UPRBCs with appropriate rise noted.   She also had difficulty with urinary retention and this was verified with a bladder scan. This was discussed with Urology, Dr Louis Meckel,  and she was started on doxazosin. Also had UTI with E coli noted on culture. This was treated with rocpehin and transitioned to cipro once discharged.    She will be followed by [facility] for  [facility] and HHPT. She will follow up in the HF clinic next week and we will check BMET/CBC  at that time.   Procedures  RHC 03/18/2015  Showed 60% LM (confirmed by IVUS) and 95% L renal artery stenosis. (R ok)  RA = 2 RV = 35/1/3 PA = 31/11 (21) PCW = 12 Fick cardiac output/index = 3.5/2.8 PVR = 2.5 WU SVR = 2121  FA sat = 94% PA sat = 55%, 57% Ao Pressure: 144/69 (102) LV Pressure: 144/8/10  Discharge Weight Range: 153 pounds.  Discharge Vitals: Blood pressure 98/33, pulse 100, temperature 97.5 F (36.4 C), temperature source Oral, resp. rate 22, height 5\' 2"  (1.575 m), weight 153 lb  14.1 oz (69.8 kg), last menstrual period 07/24/1977, SpO2 99 %.  Labs: Lab Results  Component Value Date   WBC 9.5 04/02/2015   HGB 10.4* 04/02/2015   HCT 31.6* 04/02/2015   MCV 85.6 04/02/2015   PLT 385 04/02/2015    Recent Labs Lab 04/02/15 0240  NA 131*  K 3.6  CL 93*  CO2 26  BUN 36*  CREATININE 1.68*  CALCIUM 8.3*  GLUCOSE 96   Lab Results  Component Value Date   CHOL 125 02/16/2015   HDL 27* 02/16/2015   LDLCALC 62 02/16/2015   TRIG 180* 02/16/2015   BNP (last 3 results)  Recent Labs  02/14/15 0247  03/16/15 0650  BNP 1346.9* 648.2*    ProBNP (last 3 results)  Recent Labs  03/01/15 0840  PROBNP 742.0*     Diagnostic Studies/Procedures   Dg Chest Port 1 View  03/31/2015   CLINICAL DATA:  Short of breath.  Diabetes.  EXAM: PORTABLE CHEST - 1 VIEW  COMPARISON:  03/23/2015  FINDINGS: RIGHT upper extremity PICC is present with the tip in the mid to lower SVC. Small LEFT pleural effusion. RIGHT basilar atelectasis. Compared to the prior exam 4/30, pulmonary aeration is improved. Cardiopericardial silhouette appears within normal limits for projection.Mediastinal contours are within normal limits.  IMPRESSION: Improved CHF with small LEFT pleural effusion and bilateral basilar atelectasis.   Electronically Signed   By: Dereck Ligas M.D.   On: 03/31/2015 14:11    Discharge Medications     Medication List    STOP taking these medications        amLODipine 2.5 MG tablet  Commonly known as:  NORVASC     furosemide 20 MG tablet  Commonly known as:  LASIX     JANUMET 50-500 MG per tablet  Generic drug:  sitaGLIPtin-metformin      TAKE these medications        ACCU-CHEK AVIVA PLUS test strip  Generic drug:  glucose blood     accu-chek multiclix lancets     aspirin 81 MG tablet  Take 81 mg by mouth at bedtime.     atorvastatin 10 MG tablet  Commonly known as:  LIPITOR  Take 10 mg by mouth daily.     beta carotene 10000 UNIT capsule  Take 10,000 Units by mouth daily.     calcium citrate 950 MG tablet  Commonly known as:  CALCITRATE - dosed in mg elemental calcium  Take 1 tablet by mouth 2 (two) times daily.     carvedilol 3.125 MG tablet  Commonly known as:  COREG  Take 1 tablet (3.125 mg total) by mouth 2 (two) times daily with a meal.     cholecalciferol 1000 UNITS tablet  Commonly known as:  VITAMIN D  Take 1,000 Units by mouth daily.     ciprofloxacin 500 MG tablet  Commonly known as:  CIPRO  Take 1 tablet (500 mg total) by mouth daily with breakfast.      clopidogrel 75 MG tablet  Commonly known as:  PLAVIX  Take 1 tablet (75 mg total) by mouth daily.     conjugated estrogens vaginal cream  Commonly known as:  PREMARIN  Place vaginally 2 (two) times a week.     docusate sodium 100 MG capsule  Commonly known as:  COLACE  Take 1 capsule (100 mg total) by mouth 2 (two) times daily.     glucosamine-chondroitin 500-400 MG tablet  Take 1 tablet by mouth daily.  glyBURIDE 5 MG tablet  Commonly known as:  DIABETA  Take 5 mg by mouth 2 (two) times daily with a meal.     iron polysaccharides 150 MG capsule  Commonly known as:  NIFEREX  Take 1 capsule (150 mg total) by mouth 2 (two) times daily.     lisinopril 5 MG tablet  Commonly known as:  PRINIVIL,ZESTRIL  Take 1 tablet (5 mg total) by mouth daily.  Start taking on:  04/03/2015     METANX PO  Take 1 tablet by mouth daily.     Potassium Chloride ER 20 MEQ Tbcr  Take 20 mEq by mouth daily.  Start taking on:  04/03/2015     PROBIOTIC DAILY PO  Take 1 tablet by mouth at bedtime. Florastor     sitaGLIPtin 50 MG tablet  Commonly known as:  JANUVIA  Take 1 tablet (50 mg total) by mouth daily.     spironolactone 25 MG tablet  Commonly known as:  ALDACTONE  Take 0.5 tablets (12.5 mg total) by mouth daily.  Start taking on:  04/03/2015     torsemide 20 MG tablet  Commonly known as:  DEMADEX  Take 1 tablet (20 mg total) by mouth daily.  Start taking on:  04/03/2015     Travoprost (BAK Free) 0.004 % Soln ophthalmic solution  Commonly known as:  TRAVATAN  Place 1 drop into both eyes at bedtime.     vitamin C 1000 MG tablet  Take 1,000 mg by mouth daily.     vitamin E 400 UNIT capsule  Take 400 Units by mouth daily.        Disposition   The patient will be discharged in stable condition to home.     Discharge Instructions    ACE Inhibitor / ARB already ordered    Complete by:  As directed      AMB Referral to [place] Management    Complete by:  As directed    Reason for consult:  HF exacerbation; flash pulmonary edema  Diagnoses of:  Heart Failure  Expected date of contact:  1-3 days (reserved for hospital discharges)  Please assign to community nurse for transition of care calls and assess for home visits. Patient can benefit from the [facility] tele-monitoring program for HF. For questions please contact: Natividad Brood, RN BSN [place] [facility] Liaison  [phone] business mobile phone     Amb Referral to Cardiac Rehabilitation    Complete by:  As directed   Congestive Heart Failure:   If diagnosis is Heart Failure, patient MUST meet each of the CMS criteria:   1. Left Ventricular Ejection Fraction </= 35%   2. NYHA class II-IV symptoms despite being on optimal heart failure therapy for at least 6 weeks.   3. Stable = have not had a recent (<6 weeks) or planned (<6 months) major cardiovascular hospitalization or procedure     Program Details:   - Physician supervised classes   - 1-3 classes per week over a 12-18 week period, generally for a total of 36 sessions     Physician Certification:   I certify that the above Cardiac Rehabilitation treatment is medically necessary and is medically approved by me for treatment of this patient. The patient is willing and cooperative, able to ambulate and medically stable to  participate in exercise rehabilitation. The participant's progress and Individualized Treatment Plan will be reviewed by the Medical Director, Cardiac Rehab staff and as indicated by the Referring/Ordering  Physician.  Diagnosis:  Heart Failure (see criteria below)     Diet - low sodium heart healthy    Complete by:  As directed      Increase activity slowly    Complete by:  As directed           Follow-up Information    Follow up with [name], MD On 04/09/2015.   Specialty:  Cardiology   Why:  at 0940 am in [facility]--gate code 0080--please bring all medications    Contact information:   [place] [address] [phone]       Follow up with Annamarie Major, MD In 6 months.   Specialties:  Vascular Surgery, Cardiology   Why:  Office will call you to arrange your appt (sent)   Contact information:   [place] [address] [phone]       Schedule an appointment as soon as possible for a visit with Tommy Medal, MD.   Specialty:  Internal Medicine   Contact information:   [address] [place] [address] [phone]         Duration of Discharge Encounter: Greater than 35 minutes   Signed, CLEGG,AMY NP-C  04/02/2015, 10:25 AM

## 2015-04-03 ENCOUNTER — Emergency Department (HOSPITAL_COMMUNITY): Payer: Medicare Other

## 2015-04-03 ENCOUNTER — Other Ambulatory Visit (HOSPITAL_COMMUNITY): Payer: Self-pay

## 2015-04-03 ENCOUNTER — Inpatient Hospital Stay (HOSPITAL_COMMUNITY)
Admission: EM | Admit: 2015-04-03 | Discharge: 2015-04-04 | DRG: 281 | Disposition: A | Discharge status: Home / Self Care | Payer: Medicare Other | Attending: Cardiology | Admitting: Cardiology

## 2015-04-03 DIAGNOSIS — N390 Urinary tract infection, site not specified: Secondary | ICD-10-CM | POA: Diagnosis present

## 2015-04-03 DIAGNOSIS — R0602 Shortness of breath: Secondary | ICD-10-CM | POA: Diagnosis present

## 2015-04-03 DIAGNOSIS — I5022 Chronic systolic (congestive) heart failure: Secondary | ICD-10-CM

## 2015-04-03 DIAGNOSIS — J181 Lobar pneumonia, unspecified organism: Secondary | ICD-10-CM | POA: Diagnosis not present

## 2015-04-03 DIAGNOSIS — N183 Chronic kidney disease, stage 3 unspecified: Secondary | ICD-10-CM | POA: Diagnosis present

## 2015-04-03 DIAGNOSIS — I429 Cardiomyopathy, unspecified: Secondary | ICD-10-CM | POA: Diagnosis not present

## 2015-04-03 DIAGNOSIS — I447 Left bundle-branch block, unspecified: Secondary | ICD-10-CM | POA: Diagnosis present

## 2015-04-03 DIAGNOSIS — E1165 Type 2 diabetes mellitus with hyperglycemia: Secondary | ICD-10-CM | POA: Diagnosis present

## 2015-04-03 DIAGNOSIS — E1122 Type 2 diabetes mellitus with diabetic chronic kidney disease: Secondary | ICD-10-CM | POA: Diagnosis present

## 2015-04-03 DIAGNOSIS — Z7902 Long term (current) use of antithrombotics/antiplatelets: Secondary | ICD-10-CM | POA: Diagnosis not present

## 2015-04-03 DIAGNOSIS — I214 Non-ST elevation (NSTEMI) myocardial infarction: Secondary | ICD-10-CM | POA: Diagnosis present

## 2015-04-03 DIAGNOSIS — R0789 Other chest pain: Secondary | ICD-10-CM

## 2015-04-03 DIAGNOSIS — Z87891 Personal history of nicotine dependence: Secondary | ICD-10-CM | POA: Diagnosis not present

## 2015-04-03 DIAGNOSIS — Z7982 Long term (current) use of aspirin: Secondary | ICD-10-CM | POA: Diagnosis not present

## 2015-04-03 DIAGNOSIS — E785 Hyperlipidemia, unspecified: Secondary | ICD-10-CM | POA: Diagnosis present

## 2015-04-03 DIAGNOSIS — I10 Essential (primary) hypertension: Secondary | ICD-10-CM | POA: Diagnosis present

## 2015-04-03 DIAGNOSIS — H409 Unspecified glaucoma: Secondary | ICD-10-CM | POA: Diagnosis present

## 2015-04-03 DIAGNOSIS — E871 Hypo-osmolality and hyponatremia: Secondary | ICD-10-CM | POA: Diagnosis not present

## 2015-04-03 DIAGNOSIS — B9620 Unspecified Escherichia coli [E. coli] as the cause of diseases classified elsewhere: Secondary | ICD-10-CM | POA: Diagnosis present

## 2015-04-03 DIAGNOSIS — E1129 Type 2 diabetes mellitus with other diabetic kidney complication: Secondary | ICD-10-CM | POA: Diagnosis present

## 2015-04-03 DIAGNOSIS — J90 Pleural effusion, not elsewhere classified: Secondary | ICD-10-CM | POA: Diagnosis not present

## 2015-04-03 DIAGNOSIS — I129 Hypertensive chronic kidney disease with stage 1 through stage 4 chronic kidney disease, or unspecified chronic kidney disease: Secondary | ICD-10-CM | POA: Diagnosis present

## 2015-04-03 LAB — GLUCOSE, CAPILLARY
GLUCOSE-CAPILLARY: 112 mg/dL — AB (ref 70–99)
Glucose-Capillary: 176 mg/dL — ABNORMAL HIGH (ref 70–99)
Glucose-Capillary: 198 mg/dL — ABNORMAL HIGH (ref 70–99)

## 2015-04-03 LAB — CBC
HCT: 35.4 % — ABNORMAL LOW (ref 36.0–46.0)
HEMATOCRIT: 34.1 % — AB (ref 36.0–46.0)
Hemoglobin: 11.4 g/dL — ABNORMAL LOW (ref 12.0–15.0)
Hemoglobin: 11.9 g/dL — ABNORMAL LOW (ref 12.0–15.0)
MCH: 28.9 pg (ref 26.0–34.0)
MCH: 29.3 pg (ref 26.0–34.0)
MCHC: 33.4 g/dL (ref 30.0–36.0)
MCHC: 33.6 g/dL (ref 30.0–36.0)
MCV: 86.3 fL (ref 78.0–100.0)
MCV: 87.2 fL (ref 78.0–100.0)
PLATELETS: 362 10*3/uL (ref 150–400)
Platelets: 336 10*3/uL (ref 150–400)
RBC: 3.95 MIL/uL (ref 3.87–5.11)
RBC: 4.06 MIL/uL (ref 3.87–5.11)
RDW: 14.6 % (ref 11.5–15.5)
RDW: 14.7 % (ref 11.5–15.5)
WBC: 8.3 10*3/uL (ref 4.0–10.5)
WBC: 8.6 10*3/uL (ref 4.0–10.5)

## 2015-04-03 LAB — BRAIN NATRIURETIC PEPTIDE
B Natriuretic Peptide: 508.7 pg/mL — ABNORMAL HIGH (ref 0.0–100.0)
B Natriuretic Peptide: 807.1 pg/mL — ABNORMAL HIGH (ref 0.0–100.0)

## 2015-04-03 LAB — BASIC METABOLIC PANEL
ANION GAP: 16 — AB (ref 5–15)
BUN: 36 mg/dL — ABNORMAL HIGH (ref 6–20)
CALCIUM: 8.5 mg/dL — AB (ref 8.9–10.3)
CHLORIDE: 91 mmol/L — AB (ref 101–111)
CO2: 22 mmol/L (ref 22–32)
CREATININE: 1.72 mg/dL — AB (ref 0.44–1.00)
GFR calc Af Amer: 31 mL/min — ABNORMAL LOW (ref >60)
GFR, EST NON AFRICAN AMERICAN: 27 mL/min — AB (ref >60)
Glucose, Bld: 400 mg/dL — ABNORMAL HIGH (ref 70–99)
Potassium: 3.9 mmol/L (ref 3.5–5.1)
SODIUM: 129 mmol/L — AB (ref 135–145)

## 2015-04-03 LAB — TROPONIN I
TROPONIN I: 0.06 ng/mL — AB (ref <0.031)
TROPONIN I: 0.06 ng/mL — AB (ref <0.031)
Troponin I: 0.06 ng/mL — ABNORMAL HIGH (ref <0.031)
Troponin I: 0.05 ng/mL — ABNORMAL HIGH (ref <0.031)

## 2015-04-03 LAB — COMPREHENSIVE METABOLIC PANEL
ALBUMIN: 3.3 g/dL — AB (ref 3.5–5.0)
ALT: 40 U/L (ref 14–54)
ANION GAP: 12 (ref 5–15)
AST: 40 U/L (ref 15–41)
Alkaline Phosphatase: 60 U/L (ref 38–126)
BILIRUBIN TOTAL: 0.6 mg/dL (ref 0.3–1.2)
BUN: 27 mg/dL — ABNORMAL HIGH (ref 6–20)
CO2: 24 mmol/L (ref 22–32)
Calcium: 8.9 mg/dL (ref 8.9–10.3)
Chloride: 97 mmol/L — ABNORMAL LOW (ref 101–111)
Creatinine, Ser: 1.49 mg/dL — ABNORMAL HIGH (ref 0.44–1.00)
GFR calc non Af Amer: 32 mL/min — ABNORMAL LOW (ref >60)
GFR, EST AFRICAN AMERICAN: 37 mL/min — AB (ref >60)
GLUCOSE: 145 mg/dL — AB (ref 70–99)
POTASSIUM: 3.8 mmol/L (ref 3.5–5.1)
SODIUM: 133 mmol/L — AB (ref 135–145)
Total Protein: 6.7 g/dL (ref 6.5–8.1)

## 2015-04-03 LAB — MAGNESIUM: Magnesium: 1.9 mg/dL (ref 1.7–2.4)

## 2015-04-03 LAB — I-STAT TROPONIN, ED: Troponin i, poc: 0.04 ng/mL (ref 0.00–0.08)

## 2015-04-03 LAB — TSH: TSH: 4.972 u[IU]/mL — ABNORMAL HIGH (ref 0.350–4.500)

## 2015-04-03 LAB — T4, FREE: Free T4: 1.14 ng/dL — ABNORMAL HIGH (ref 0.61–1.12)

## 2015-04-03 MED ORDER — CIPROFLOXACIN HCL 500 MG PO TABS
500.0000 mg | ORAL_TABLET | Freq: Every day | ORAL | Status: DC
  Administered 2015-04-03 – 2015-04-04 (×2): 500 mg via ORAL
  Filled 2015-04-03 (×3): qty 1

## 2015-04-03 MED ORDER — TORSEMIDE 20 MG PO TABS
20.0000 mg | ORAL_TABLET | Freq: Every day | ORAL | Status: DC
  Administered 2015-04-03 – 2015-04-04 (×2): 20 mg via ORAL
  Filled 2015-04-03 (×2): qty 1

## 2015-04-03 MED ORDER — ASPIRIN 325 MG PO TABS
325.0000 mg | ORAL_TABLET | Freq: Once | ORAL | Status: AC
  Administered 2015-04-03: 325 mg via ORAL
  Filled 2015-04-03: qty 1

## 2015-04-03 MED ORDER — ATORVASTATIN CALCIUM 10 MG PO TABS
10.0000 mg | ORAL_TABLET | Freq: Every day | ORAL | Status: DC
  Administered 2015-04-03: 10 mg via ORAL
  Filled 2015-04-03 (×2): qty 1

## 2015-04-03 MED ORDER — INSULIN ASPART 100 UNIT/ML ~~LOC~~ SOLN
0.0000 [IU] | Freq: Three times a day (TID) | SUBCUTANEOUS | Status: DC
  Administered 2015-04-03: 3 [IU] via SUBCUTANEOUS

## 2015-04-03 MED ORDER — ASPIRIN EC 81 MG PO TBEC
81.0000 mg | DELAYED_RELEASE_TABLET | Freq: Every day | ORAL | Status: DC
  Administered 2015-04-04: 81 mg via ORAL
  Filled 2015-04-03: qty 1

## 2015-04-03 MED ORDER — SODIUM CHLORIDE 0.9 % IJ SOLN: 3.0000 mL | INTRAMUSCULAR | Status: DC | PRN

## 2015-04-03 MED ORDER — ACETAMINOPHEN 325 MG PO TABS: 650.0000 mg | ORAL_TABLET | ORAL | Status: DC | PRN

## 2015-04-03 MED ORDER — ASPIRIN 81 MG PO TABS: 81.0000 mg | ORAL_TABLET | Freq: Every day | ORAL | Status: DC

## 2015-04-03 MED ORDER — ONDANSETRON HCL 4 MG/2ML IJ SOLN: 4.0000 mg | Freq: Four times a day (QID) | INTRAMUSCULAR | Status: DC | PRN

## 2015-04-03 MED ORDER — LISINOPRIL 5 MG PO TABS
5.0000 mg | ORAL_TABLET | Freq: Every day | ORAL | Status: DC
  Administered 2015-04-03 – 2015-04-04 (×2): 5 mg via ORAL
  Filled 2015-04-03 (×2): qty 1

## 2015-04-03 MED ORDER — CLOPIDOGREL BISULFATE 75 MG PO TABS
75.0000 mg | ORAL_TABLET | Freq: Every day | ORAL | Status: DC
  Administered 2015-04-04: 75 mg via ORAL
  Filled 2015-04-03 (×2): qty 1

## 2015-04-03 MED ORDER — SPIRONOLACTONE 12.5 MG HALF TABLET
12.5000 mg | ORAL_TABLET | Freq: Every day | ORAL | Status: DC
  Administered 2015-04-03 – 2015-04-04 (×2): 12.5 mg via ORAL
  Filled 2015-04-03 (×2): qty 1

## 2015-04-03 MED ORDER — SITAGLIPTIN PHOSPHATE 25 MG PO TABS
25.0000 mg | ORAL_TABLET | Freq: Every day | ORAL | Status: DC
  Administered 2015-04-03 – 2015-04-04 (×2): 25 mg via ORAL
  Filled 2015-04-03 (×2): qty 1

## 2015-04-03 MED ORDER — NITROGLYCERIN 0.4 MG SL SUBL: 0.4000 mg | SUBLINGUAL_TABLET | SUBLINGUAL | Status: DC | PRN

## 2015-04-03 MED ORDER — METANX 3-35-2 MG PO TABS: 1.0000 | ORAL_TABLET | Freq: Every day | ORAL | Status: DC

## 2015-04-03 MED ORDER — LATANOPROST 0.005 % OP SOLN
1.0000 [drp] | Freq: Every day | OPHTHALMIC | Status: DC
  Administered 2015-04-03: 1 [drp] via OPHTHALMIC
  Filled 2015-04-03: qty 2.5

## 2015-04-03 MED ORDER — CLOPIDOGREL BISULFATE 75 MG PO TABS: 75.0000 mg | ORAL_TABLET | Freq: Every day | ORAL | Status: DC

## 2015-04-03 MED ORDER — L-METHYLFOLATE-B6-B12 3-35-2 MG PO TABS
1.0000 | ORAL_TABLET | Freq: Every day | ORAL | Status: DC
  Administered 2015-04-04: 1 via ORAL
  Filled 2015-04-03 (×2): qty 1

## 2015-04-03 MED ORDER — GLYBURIDE 5 MG PO TABS
5.0000 mg | ORAL_TABLET | Freq: Two times a day (BID) | ORAL | Status: DC
  Filled 2015-04-03 (×2): qty 1

## 2015-04-03 MED ORDER — SODIUM CHLORIDE 0.9 % IJ SOLN
3.0000 mL | Freq: Two times a day (BID) | INTRAMUSCULAR | Status: DC
  Administered 2015-04-03 – 2015-04-04 (×3): 3 mL via INTRAVENOUS

## 2015-04-03 MED ORDER — SITAGLIPTIN PHOSPHATE 50 MG PO TABS
50.0000 mg | ORAL_TABLET | Freq: Two times a day (BID) | ORAL | Status: DC
  Filled 2015-04-03 (×2): qty 1

## 2015-04-03 MED ORDER — POLYSACCHARIDE IRON COMPLEX 150 MG PO CAPS
150.0000 mg | ORAL_CAPSULE | Freq: Two times a day (BID) | ORAL | Status: DC
  Administered 2015-04-03 – 2015-04-04 (×3): 150 mg via ORAL
  Filled 2015-04-03 (×4): qty 1

## 2015-04-03 MED ORDER — NON FORMULARY: 50.0000 mg | Freq: Two times a day (BID) | Status: DC

## 2015-04-03 MED ORDER — ISOSORBIDE MONONITRATE ER 30 MG PO TB24
30.0000 mg | ORAL_TABLET | Freq: Every day | ORAL | Status: DC
  Administered 2015-04-03 – 2015-04-04 (×2): 30 mg via ORAL
  Filled 2015-04-03 (×2): qty 1

## 2015-04-03 MED ORDER — GLIPIZIDE 5 MG PO TABS
5.0000 mg | ORAL_TABLET | Freq: Every day | ORAL | Status: DC
  Administered 2015-04-04: 5 mg via ORAL
  Filled 2015-04-03 (×2): qty 1

## 2015-04-03 MED ORDER — SODIUM CHLORIDE 0.9 % IV SOLN: 250.0000 mL | INTRAVENOUS | Status: DC | PRN

## 2015-04-03 MED ORDER — HEPARIN SODIUM (PORCINE) 5000 UNIT/ML IJ SOLN
5000.0000 [IU] | Freq: Three times a day (TID) | INTRAMUSCULAR | Status: DC
  Administered 2015-04-03 – 2015-04-04 (×3): 5000 [IU] via SUBCUTANEOUS
  Filled 2015-04-03 (×6): qty 1

## 2015-04-03 MED ORDER — ZOLPIDEM TARTRATE 5 MG PO TABS
5.0000 mg | ORAL_TABLET | Freq: Every evening | ORAL | Status: DC | PRN
  Administered 2015-04-03: 5 mg via ORAL
  Filled 2015-04-03: qty 1

## 2015-04-03 MED ORDER — POTASSIUM CHLORIDE ER 10 MEQ PO TBCR
20.0000 meq | EXTENDED_RELEASE_TABLET | Freq: Every day | ORAL | Status: DC
  Administered 2015-04-03 – 2015-04-04 (×2): 20 meq via ORAL
  Filled 2015-04-03 (×2): qty 2

## 2015-04-03 MED ORDER — MENTHOL 3 MG MT LOZG
1.0000 | LOZENGE | OROMUCOSAL | Status: DC | PRN
  Administered 2015-04-03: 3 mg via ORAL
  Filled 2015-04-03: qty 9

## 2015-04-03 MED ORDER — CARVEDILOL 3.125 MG PO TABS
3.1250 mg | ORAL_TABLET | Freq: Two times a day (BID) | ORAL | Status: DC
  Administered 2015-04-03 – 2015-04-04 (×2): 3.125 mg via ORAL
  Filled 2015-04-03 (×4): qty 1

## 2015-04-03 NOTE — ED Provider Notes (Addendum)
CSN: [id number]     Arrival date & time 04/02/15  2337 History  This chart was scribed for [name], MD by Delphia Grates, ED Scribe. This patient was seen in room B19C/B19C and the patient's care was started at 3:53 AM.    Chief Complaint  Patient presents with  . Shortness of Breath    The history is provided by the patient. No language interpreter was used.     HPI Comments: Katie Fuentes is a 81 y.o. female, with history of CHF, who presents to the Emergency Department complaining of worsening, constant, upper chest tightness that began yesterday evening (04/02/2015). Patient states she was recently discharged from the hospital yesterday afternoon (04/02/2015). There is associated SOB and BLE swelling. Patient has been on Lasix for a approximately 1 month and notes compliance with this medication. She reports history of prior similar episode of chest tightness, 3 days ago, while in the hospital. She denies cough, diaphoresis, nausea, or vomiting.    Past Medical History  Diagnosis Date  . Atrophic vaginitis   . Yeast infection   . Glaucoma   . Cataracts, bilateral   . Type 2 diabetes, controlled, with renal manifestation 01/2000  . Hyperlipidemia   . Hypertension   . Congestive heart failure   . Cardiomyopathy   . LBBB (left bundle branch block)   . Chronic renal disease, stage III    Past Surgical History  Procedure Laterality Date  . Vaginal delivery      x3  . Colonoscopy  05/2003  . Finger surgery    . Cataract extraction w/ intraocular lens implant Right 2011    1 year later the left side done  . Colonoscopy  06/2008    Dr. Lajoyce Corners recheck in 5 years  . Total vaginal hysterectomy  1978    for endometriosis  . Appendectomy  1950  . Squamous cell carcinoma excision  6/15    Nose  . Left and right heart catheterization with coronary angiogram N/A 03/18/2015    Procedure: LEFT AND RIGHT HEART CATHETERIZATION WITH CORONARY ANGIOGRAM;  Surgeon: Jolaine Artist,  MD;  Location: [facility];  Service: Cardiovascular;  Laterality: N/A;  . Peripheral vascular catheterization N/A 03/29/2015    Procedure: Renal Intervention;  Surgeon: Serafina Mitchell, MD;  Location: [place] [facility];  Service: Cardiovascular;  Laterality: N/A;  . Percutaneous stent intervention Left 03/29/2015    Procedure: Percutaneous Stent Intervention;  Surgeon: Serafina Mitchell, MD;  Location: [place] [facility];  Service: Cardiovascular;  Laterality: Left;  Renal   Family History  Problem Relation Age of Onset  . Heart disease  67  . CAD Brother   . Hypertension Brother    History  Substance Use Topics  . Smoking status: Former Smoker -- 0.10 packs/day for 2 years    Types: Cigarettes  . Smokeless tobacco: Never Used  . Alcohol Use: No   OB History    Gravida Para Term Preterm AB TAB SAB Ectopic Multiple Living   4 3   1     3      Review of Systems  A complete 10 system review of systems was obtained and all systems are negative except as noted in the HPI and PMH.    Allergies  Cefdinir; Macrobid; Sulfa antibiotics; Alphagan; and Azopt  Home Medications   Prior to Admission medications   Medication Sig Start Date End Date Taking? Authorizing Provider  ACCU-CHEK AVIVA PLUS test strip  04/27/13  Historical Provider, MD  Ascorbic Acid (VITAMIN C) 1000 MG tablet Take 1,000 mg by mouth daily.    Historical Provider, MD  aspirin 81 MG tablet Take 81 mg by mouth at bedtime.     Historical Provider, MD  atorvastatin (LIPITOR) 10 MG tablet Take 10 mg by mouth daily.    Historical Provider, MD  beta carotene 10000 UNIT capsule Take 10,000 Units by mouth daily.    Historical Provider, MD  calcium citrate (CALCITRATE - DOSED IN MG ELEMENTAL CALCIUM) 950 MG tablet Take 1 tablet by mouth 2 (two) times daily.     Historical Provider, MD  carvedilol (COREG) 3.125 MG tablet Take 1 tablet (3.125 mg total) by mouth 2 (two) times daily with a meal. 04/02/15   Np, NP  cholecalciferol  (VITAMIN D) 1000 UNITS tablet Take 1,000 Units by mouth daily.    Historical Provider, MD  ciprofloxacin (CIPRO) 500 MG tablet Take 1 tablet (500 mg total) by mouth daily with breakfast. 04/02/15   Np, NP  clopidogrel (PLAVIX) 75 MG tablet Take 1 tablet (75 mg total) by mouth daily. 04/02/15   Np, NP  conjugated estrogens (PREMARIN) vaginal cream Place vaginally 2 (two) times a week. Patient not taking: Reported on 03/16/2015 05/24/14   Fnp, FNP  docusate sodium (COLACE) 100 MG capsule Take 1 capsule (100 mg total) by mouth 2 (two) times daily. 02/18/15   Bonnielee Haff, MD  glucosamine-chondroitin 500-400 MG tablet Take 1 tablet by mouth daily.     Historical Provider, MD  glyBURIDE (DIABETA) 5 MG tablet Take 5 mg by mouth 2 (two) times daily with a meal.     Historical Provider, MD  iron polysaccharides (NIFEREX) 150 MG capsule Take 1 capsule (150 mg total) by mouth 2 (two) times daily. 02/18/15   Bonnielee Haff, MD  L-Methylfolate-B6-B12 (METANX PO) Take 1 tablet by mouth daily.     Historical Provider, MD  Lancets (ACCU-CHEK MULTICLIX) lancets  02/26/15   Historical Provider, MD  lisinopril (PRINIVIL,ZESTRIL) 5 MG tablet Take 1 tablet (5 mg total) by mouth daily. 04/03/15   Np, NP  potassium chloride 20 MEQ TBCR Take 20 mEq by mouth daily. 04/03/15   Np, NP  Probiotic Product (PROBIOTIC DAILY PO) Take 1 tablet by mouth at bedtime. Florastor    Historical Provider, MD  sitaGLIPtin (JANUVIA) 50 MG tablet Take 1 tablet (50 mg total) by mouth daily. Patient taking differently: Take 50 mg by mouth 2 (two) times daily.  02/18/15   Bonnielee Haff, MD  spironolactone (ALDACTONE) 25 MG tablet Take 0.5 tablets (12.5 mg total) by mouth daily. 04/03/15   Np, NP  torsemide (DEMADEX) 20 MG tablet Take 1 tablet (20 mg total) by mouth daily. 04/03/15   Np, NP  Travoprost, BAK Free, (TRAVATAN) 0.004 % SOLN ophthalmic solution Place 1 drop into both eyes at bedtime.    Historical Provider, MD  vitamin E 400 UNIT capsule Take  400 Units by mouth daily.    Historical Provider, MD   Triage Vitals: BP 141/61 mmHg  Pulse 104  Temp(Src) 98.1 F (36.7 C) (Oral)  Resp 18  Ht 5\' 2"  (1.575 m)  Wt 153 lb (69.4 kg)  BMI 27.98 kg/m2  SpO2 98%  LMP 07/24/1977  Physical Exam  Constitutional: She is oriented to person, place, and time. She appears well-developed and well-nourished. No distress.  HENT:  Head: Normocephalic and atraumatic.  Nose: Nose normal.  Mouth/Throat: Oropharynx is clear and moist. No oropharyngeal exudate.  Eyes:  Conjunctivae and EOM are normal. Pupils are equal, round, and reactive to light. No scleral icterus.  Neck: Normal range of motion. Neck supple. No JVD present. No tracheal deviation present. No thyromegaly present.  Cardiovascular: Normal rate, regular rhythm and normal heart sounds.  Exam reveals no gallop and no friction rub.   No murmur heard. Pulmonary/Chest: Effort normal and breath sounds normal. No respiratory distress. She has no wheezes. She exhibits no tenderness.  Abdominal: Soft. Bowel sounds are normal. She exhibits no distension and no mass. There is no tenderness. There is no rebound and no guarding.  Musculoskeletal: Normal range of motion. She exhibits edema. She exhibits no tenderness.  1+ edema to the bilateral knees.  Lymphadenopathy:    She has no cervical adenopathy.  Neurological: She is alert and oriented to person, place, and time. No cranial nerve deficit. She exhibits normal muscle tone.  Skin: Skin is warm and dry. No rash noted. No erythema. No pallor.  Nursing note and vitals reviewed.   ED Course  Procedures (including critical care time)  DIAGNOSTIC STUDIES: Oxygen Saturation is 98% on room air, normal by my interpretation.    COORDINATION OF CARE: At 65 Discussed treatment plan with patient which includes review of labs and CXR. Patient agrees.   Labs Review Labs Reviewed  CBC - Abnormal; Notable for the following:    Hemoglobin 11.4 (*)     HCT 34.1 (*)    All other components within normal limits  BRAIN NATRIURETIC PEPTIDE - Abnormal; Notable for the following:    B Natriuretic Peptide 508.7 (*)    All other components within normal limits  BASIC METABOLIC PANEL - Abnormal; Notable for the following:    Sodium 129 (*)    Chloride 91 (*)    Glucose, Bld 400 (*)    BUN 36 (*)    Creatinine, Ser 1.72 (*)    Calcium 8.5 (*)    GFR calc non Af Amer 27 (*)    GFR calc Af Amer 31 (*)    Anion gap 16 (*)    All other components within normal limits  TROPONIN I - Abnormal; Notable for the following:    Troponin I 0.06 (*)    All other components within normal limits  I-STAT TROPOININ, ED    Imaging Review Dg Chest 2 View  04/03/2015   CLINICAL DATA:  Dyspnea.  EXAM: CHEST  2 VIEW  COMPARISON:  03/31/2015  FINDINGS: There is partial clearance of left base consolidation and effusion since 03/31/2015. A small left effusion remains, and there is minimal left base airspace opacity adjacent to the pleural fluid collection. The right lung is clear. The pulmonary vasculature is normal. Hilar, mediastinal and cardiac contours appear unremarkable and unchanged  IMPRESSION: Improved, with partial clearance of left base consolidation and effusion since 03/31/2015.   Electronically Signed   By: Andreas Newport M.D.   On: 04/03/2015 00:31     EKG Interpretation   Date/Time:  Tuesday Apr 02 2015 23:44:47 EDT Ventricular Rate:  107 PR Interval:  160 QRS Duration: 164 QT Interval:  398 QTC Calculation: 531 R Axis:   35 Text Interpretation:  Sinus tachycardia Left bundle branch block Abnormal  ECG HR has increased by 9  beats Confirmed by OTTER  MD, OLGA ([id number]) on  04/02/2015 11:47:14 PM      MDM   Final diagnoses:  Chest tightness  SOB (shortness of breath)   Patient since emergency department for chest pain and  shortness of breath. She was recently discharged a new diagnosis of congestive heart failure. She has been compliant  with her torsemide medication. Her chest pain is low risk by history for ACS as there is no exertional component. She had no vomiting or diaphoresis. This also occurred during her recent hospitalization when she was admitted, cardiology did not find any ischemia at this time. EKG is unchanged from previous, chest x-ray has improved clearance of left base consolidation and effusion.  Laboratory studies reveals hyponatremia however this is in the setting of hyperglycemia.    Patient was retained and monitored in the emergency department overnight. I obtained a repeat troponin as her hyperglycemia may be caused by the ischemic episode earlier today. Repeat troponin is higher at 0.06 from 0.04. Patient is having NSTEMI. Cardiology was paged for admission.  Patient was given a full dose aspirin in the ED.  CRITICAL CARE Performed by: [name]   Total critical care time: 27min NSTEMI  Critical care time was exclusive of separately billable procedures and treating other patients.  Critical care was necessary to treat or prevent imminent or life-threatening deterioration.  Critical care was time spent personally by me on the following activities: development of treatment plan with patient and/or surrogate as well as nursing, discussions with consultants, evaluation of patient's response to treatment, examination of patient, obtaining history from patient or surrogate, ordering and performing treatments and interventions, ordering and review of laboratory studies, ordering and review of radiographic studies, pulse oximetry and re-evaluation of patient's condition.   I personally performed the services described in this documentation, which was scribed in my presence. The recorded information has been reviewed and is accurate.    [name], MD 04/03/15 1429  [name], MD 04/11/15 1610

## 2015-04-03 NOTE — H&P (Signed)
Patient ID: Katie Fuentes MRN: [id number], DOB/AGE: Sep 30, 1934   Admit date: 04/03/2015   Primary Physician: Tommy Medal, MD Primary Cardiologist: Dr. Haroldine Laws  Pt. Profile: 81 year old woman who was discharged yesterday and returned to the hospital 4 hours later because of chest tightness.  Serial troponins in the emergency room have shown slight elevation.  Problem List  Past Medical History  Diagnosis Date  . Atrophic vaginitis   . Yeast infection   . Glaucoma   . Cataracts, bilateral   . Type 2 diabetes, controlled, with renal manifestation 01/2000  . Hyperlipidemia   . Hypertension   . Congestive heart failure   . Cardiomyopathy   . LBBB (left bundle branch block)   . Chronic renal disease, stage III     Past Surgical History  Procedure Laterality Date  . Vaginal delivery      x3  . Colonoscopy  05/2003  . Finger surgery    . Cataract extraction w/ intraocular lens implant Right 2011    1 year later the left side done  . Colonoscopy  06/2008    Dr. Lajoyce Corners recheck in 5 years  . Total vaginal hysterectomy  1978    for endometriosis  . Appendectomy  1950  . Squamous cell carcinoma excision  6/15    Nose  . Left and right heart catheterization with coronary angiogram N/A 03/18/2015    Procedure: LEFT AND RIGHT HEART CATHETERIZATION WITH CORONARY ANGIOGRAM;  Surgeon: Jolaine Artist, MD;  Location: [facility] CATH LAB;  Service: Cardiovascular;  Laterality: N/A;  . Peripheral vascular catheterization N/A 03/29/2015    Procedure: Renal Intervention;  Surgeon: Serafina Mitchell, MD;  Location: [place] CV LAB;  Service: Cardiovascular;  Laterality: N/A;  . Percutaneous stent intervention Left 03/29/2015    Procedure: Percutaneous Stent Intervention;  Surgeon: Serafina Mitchell, MD;  Location: [place] [facility] LAB;  Service: Cardiovascular;  Laterality: Left;  Renal     Allergies  Allergies  Allergen Reactions  . Cefdinir Diarrhea  . Macrobid [Nitrofurantoin  Macrocrystal] Nausea And Vomiting and Other (See Comments)    Fever, Chills  . Sulfa Antibiotics Nausea And Vomiting and Other (See Comments)    Fever,chills  . Alphagan [Brimonidine] Other (See Comments)    Redness in the eyes  . Azopt [Brinzolamide]     Redness in the eyes    HPI This 81 year old woman was readmitted today because of substernal chest pressure.  She was discharged yesterday.  She had been admitted on 03/16/15 and discharged on 04/02/15.  She has a history of previous acute respiratory failure secondary to flash pulmonary edema.  During her recent hospitalization she was found to have a significant left renal artery stenosis and on 03/29/15 underwent stenting of the renal artery stenosis by Dr. Trula Slade.  The patient has a history of left ventricular systolic dysfunction and congestive heart failure and was followed closely during her recent hospitalization on the heart failure service.  She was diuresed and at the time of discharge her CVP was low in the range of 3.  During her hospitalization on 03/18/15 she underwent right and left heart cardiac catheterization the results of which are noted below: 1. Heavily calcified LM lesion that appears to be 60-70% stenosed, appeared okay by IVUS. 2. Severe HTN with 95% left renal artery stenosis 3. Normal filling pressures with adequate cardiac output. Very high SVR.   The patient states that she felt well when she left the hospital.  After arriving home  she initially felt well but then developed a precordial heaviness.  It persisted.  There were no associated symptoms or sign such as nausea vomiting diaphoresis or any radiation of the discomfort to her neck or down her arms.  She did not have any prior history of angina pectoris.  She was not familiar with sublingual nitroglycerin and did not have any nitroglycerin on hand.  She called the help line of the [facility] and spoke to the person on the line who advised that she come back to  the emergency room to be rechecked.  Her discomfort subsided after she arrived in the emergency room.  She is now pain-free. Her EKG shows left bundle branch block unchanged from prior tracings.  Her troponin is slightly elevated at 0.06. She is on dual antiplatelet therapy with baby aspirin and Plavix Home Medications  Prior to Admission medications   Medication Sig Start Date End Date Taking? Authorizing Provider  Ascorbic Acid (VITAMIN C) 1000 MG tablet Take 1,000 mg by mouth daily.   Yes Historical Provider, MD  aspirin 81 MG tablet Take 81 mg by mouth at bedtime.    Yes Historical Provider, MD  atorvastatin (LIPITOR) 10 MG tablet Take 10 mg by mouth daily.   Yes Historical Provider, MD  beta carotene 10000 UNIT capsule Take 10,000 Units by mouth daily.   Yes Historical Provider, MD  calcium citrate (CALCITRATE - DOSED IN MG ELEMENTAL CALCIUM) 950 MG tablet Take 1 tablet by mouth 2 (two) times daily.    Yes Historical Provider, MD  carvedilol (COREG) 3.125 MG tablet Take 1 tablet (3.125 mg total) by mouth 2 (two) times daily with a meal. 04/02/15  Yes Np, NP  cholecalciferol (VITAMIN D) 1000 UNITS tablet Take 1,000 Units by mouth daily.   Yes Historical Provider, MD  ciprofloxacin (CIPRO) 500 MG tablet Take 1 tablet (500 mg total) by mouth daily with breakfast. 04/02/15  Yes Np, NP  clopidogrel (PLAVIX) 75 MG tablet Take 1 tablet (75 mg total) by mouth daily. 04/02/15  Yes Np, NP  docusate sodium (COLACE) 100 MG capsule Take 1 capsule (100 mg total) by mouth 2 (two) times daily. 02/18/15  Yes Bonnielee Haff, MD  glucosamine-chondroitin 500-400 MG tablet Take 1 tablet by mouth daily.    Yes Historical Provider, MD  glyBURIDE (DIABETA) 5 MG tablet Take 5 mg by mouth 2 (two) times daily with a meal.    Yes Historical Provider, MD  iron polysaccharides (NIFEREX) 150 MG capsule Take 1 capsule (150 mg total) by mouth 2 (two) times daily. 02/18/15  Yes Bonnielee Haff, MD  L-Methylfolate-B6-B12 (METANX  PO) Take 1 tablet by mouth daily.    Yes Historical Provider, MD  lisinopril (PRINIVIL,ZESTRIL) 5 MG tablet Take 1 tablet (5 mg total) by mouth daily. 04/03/15  Yes Np, NP  potassium chloride 20 MEQ TBCR Take 20 mEq by mouth daily. 04/03/15  Yes Np, NP  Probiotic Product (PROBIOTIC DAILY PO) Take 1 tablet by mouth at bedtime. Florastor   Yes Historical Provider, MD  sitaGLIPtin (JANUVIA) 50 MG tablet Take 1 tablet (50 mg total) by mouth daily. Patient taking differently: Take 50 mg by mouth 2 (two) times daily.  02/18/15  Yes Bonnielee Haff, MD  spironolactone (ALDACTONE) 25 MG tablet Take 0.5 tablets (12.5 mg total) by mouth daily. 04/03/15  Yes Np, NP  torsemide (DEMADEX) 20 MG tablet Take 1 tablet (20 mg total) by mouth daily. 04/03/15  Yes Np, NP  Travoprost, BAK Free, (  TRAVATAN) 0.004 % SOLN ophthalmic solution Place 1 drop into both eyes at bedtime.   Yes Historical Provider, MD  vitamin E 400 UNIT capsule Take 400 Units by mouth daily.   Yes Historical Provider, MD  ACCU-CHEK AVIVA PLUS test strip  04/27/13   Historical Provider, MD  conjugated estrogens (PREMARIN) vaginal cream Place vaginally 2 (two) times a week. Patient not taking: Reported on 03/16/2015 05/24/14   Fnp, FNP  Lancets (ACCU-CHEK MULTICLIX) lancets  02/26/15   Historical Provider, MD    Family History  Family History  Problem Relation Age of Onset  . Heart disease  67  . CAD Brother   . Hypertension Brother     Social History  History   Social History  . Marital Status: Married    Spouse Name: N/A  . Number of Children: N/A  . Years of Education: N/A   Occupational History  . Not on file.   Social History Main Topics  . Smoking status: Former Smoker -- 0.10 packs/day for 2 years    Types: Cigarettes  . Smokeless tobacco: Never Used  . Alcohol Use: No  . Drug Use: No  . Sexual Activity: Yes    Birth Control/ Protection: Surgical     Comment: [facility]   Other Topics Concern  . Not on file   Social History  Narrative     Review of Systems General:  No chills, fever, night sweats or weight changes.  Cardiovascular:  No chest pain, dyspnea on exertion, edema, orthopnea, palpitations, paroxysmal nocturnal dyspnea. Dermatological: No rash, lesions/masses Respiratory: No cough, dyspnea Urologic: No hematuria, dysuria.  Presently being treated with Cipro for a urinary tract infection Abdominal:   No nausea, vomiting, diarrhea, bright red blood per rectum, melena, or hematemesis Neurologic:  No visual changes, wkns, changes in mental status. All other systems reviewed and are otherwise negative except as noted above.  Physical Exam  Blood pressure 143/65, pulse 93, temperature 98.1 F (36.7 C), temperature source Oral, resp. rate 19, height 5\' 2"  (1.575 m), weight 153 lb (69.4 kg), last menstrual period 07/24/1977, SpO2 98 %.  General: Pleasant, NAD Psych: Normal affect. Neuro: Alert and oriented X 3. Moves all extremities spontaneously. HEENT: Normal  Neck: Supple without bruits or JVD. Lungs:  Resp regular and unlabored, CTA. Heart: RRR no s3, s4, or murmurs.  The second heart sound is paradoxically split from the left bundle-branch block. Abdomen: Soft, non-tender, non-distended, BS + x 4.  Extremities: No clubbing, cyanosis or edema. DP/PT/Radials 2+ and equal bilaterally.  Labs  Troponin [facility] of Care Test)  Recent Labs  04/02/15 2357  TROPIPOC 0.04    Recent Labs  03/31/15 1345 03/31/15 1600 04/01/15 0001 04/03/15 0415  CKTOTAL  --  46  --   --   CKMB  --  6.5*  --   --   TROPONINI 0.18*  --  0.14* 0.06*   Lab Results  Component Value Date   WBC 8.3 04/02/2015   HGB 11.4* 04/02/2015   HCT 34.1* 04/02/2015   MCV 86.3 04/02/2015   PLT 336 04/02/2015     Recent Labs Lab 04/02/15 2353  NA 129*  K 3.9  CL 91*  CO2 22  BUN 36*  CREATININE 1.72*  CALCIUM 8.5*  GLUCOSE 400*   Lab Results  Component Value Date   CHOL 125 02/16/2015   HDL 27* 02/16/2015    LDLCALC 62 02/16/2015   TRIG 180* 02/16/2015   No results found for: DDIMER  Radiology/Studies  Dg Chest 2 View  04/03/2015   CLINICAL DATA:  Dyspnea.  EXAM: CHEST  2 VIEW  COMPARISON:  03/31/2015  FINDINGS: There is partial clearance of left base consolidation and effusion since 03/31/2015. A small left effusion remains, and there is minimal left base airspace opacity adjacent to the pleural fluid collection. The right lung is clear. The pulmonary vasculature is normal. Hilar, mediastinal and cardiac contours appear unremarkable and unchanged  IMPRESSION: Improved, with partial clearance of left base consolidation and effusion since 03/31/2015.   Electronically Signed   By: Andreas Newport M.D.   On: 04/03/2015 00:31   Dg Chest Port 1 View  03/31/2015   CLINICAL DATA:  Short of breath.  Diabetes.  EXAM: PORTABLE CHEST - 1 VIEW  COMPARISON:  03/23/2015  FINDINGS: RIGHT upper extremity PICC is present with the tip in the mid to lower SVC. Small LEFT pleural effusion. RIGHT basilar atelectasis. Compared to the prior exam 4/30, pulmonary aeration is improved. Cardiopericardial silhouette appears within normal limits for projection.Mediastinal contours are within normal limits.  IMPRESSION: Improved CHF with small LEFT pleural effusion and bilateral basilar atelectasis.   Electronically Signed   By: Dereck Ligas M.D.   On: 03/31/2015 14:11   Dg Chest Port 1 View  03/23/2015   CLINICAL DATA:  Shortness of breath.  EXAM: PORTABLE CHEST - 1 VIEW  COMPARISON:  March 17, 2015  FINDINGS: Central catheter tip is in the superior vena cava. No pneumothorax. There is borderline cardiomegaly with interstitial edema and bilateral pleural effusions. There is airspace consolidation in the medial left base. No adenopathy.  IMPRESSION: Congestive heart failure. Consolidation medial left base, stable. Central catheter tip in superior vena cava without pneumothorax.   Electronically Signed   By: Lowella Grip III  M.D.   On: 03/23/2015 09:42   Dg Chest Port 1 View  03/17/2015   CLINICAL DATA:  Respiratory distress.  EXAM: PORTABLE CHEST - 1 VIEW  COMPARISON:  03/16/2015.  FINDINGS: Normal sized heart. The pulmonary vasculature and interstitial markings remain prominent. Small bilateral pleural effusions without significant change. Increased left lower lobe airspace opacity and mild patchy opacity at the right lung base. Mild thoracic spine degenerative changes.  IMPRESSION: 1. Interval bibasilar airspace opacity. This could represent atelectasis, pneumonia or alveolar edema. 2. Otherwise, stable changes of congestive heart failure.   Electronically Signed   By: Claudie Revering M.D.   On: 03/17/2015 08:22   Dg Chest Portable 1 View  03/16/2015   CLINICAL DATA:  c/o of shortness of breath that has been ongoing since Wednesday and worsened tonight. With EMS, patient was diaphoretic, faint and SOB. Room Air patient was 88%, placed on CPAP O2 at 94. HX:HTN, dm  EXAM: PORTABLE CHEST - 1 VIEW  COMPARISON:  02/16/2015  FINDINGS: There is bilateral irregular interstitial thickening most prominent in the lung bases. Small pleural effusions. No pneumothorax.  Cardiac silhouette is normal in size and configuration. No mediastinal or hilar masses.  Bony thorax is intact.  IMPRESSION: Irregular interstitial thickening with small effusions is consistent with interstitial edema. This may be on the basis of congestive heart failure or noncardiogenic in origin. Appearance is similar to a chest radiograph dated 02/14/2015. No convincing pneumonia.   Electronically Signed   By: Lajean Manes M.D.   On: 03/16/2015 07:51    ECG  Normal sinus rhythm.  Left bundle branch block.  No change from prior tracings.  ASSESSMENT AND PLAN  1.  Precordial  chest heaviness consistent with angina pectoris.  Very slight elevation of troponin consistent with non-STEMI.  Known 60% left main coronary artery lesion. 2.  Chronic systolic heart failure  with ejection fraction 30-35%.  Currently she does not appear to be fluid overloaded. 3.  Chronic kidney disease stage III. 4.  Left bundle branch block 5.  Diabetes mellitus, type II, poorly controlled with blood Sugar 400 today. 6.  Past history of hypertension, currently normotensive 7.  Status post stent to left renal artery stenosis on 03/29/15 8.  Escherichia coli urinary tract infection, on Cipro. 9.  Anemia.  Received 2 units of packed cells on her recent admission 10.  Hyperlipidemia  Plan: We will admit.  We will obtain serial enzymes.  We will continue medical therapy for her coronary artery disease and add nitrates. Her blood sugar is poorly controlled.  She may need the addition of something like Lantus insulin.  We will start out with a sliding scale. She is euvolemic.  Her chest x-ray looks good.  No edema.  Continue current diuretic therapy.   Max Fickle, MD  04/03/2015, 8:05 AM

## 2015-04-03 NOTE — ED Notes (Addendum)
Attempted report 

## 2015-04-04 LAB — BASIC METABOLIC PANEL
ANION GAP: 10 (ref 5–15)
BUN: 30 mg/dL — ABNORMAL HIGH (ref 6–20)
CALCIUM: 8.5 mg/dL — AB (ref 8.9–10.3)
CO2: 23 mmol/L (ref 22–32)
CREATININE: 1.51 mg/dL — AB (ref 0.44–1.00)
Chloride: 100 mmol/L — ABNORMAL LOW (ref 101–111)
GFR, EST AFRICAN AMERICAN: 37 mL/min — AB (ref >60)
GFR, EST NON AFRICAN AMERICAN: 32 mL/min — AB (ref >60)
Glucose, Bld: 125 mg/dL — ABNORMAL HIGH (ref 65–99)
Potassium: 4.6 mmol/L (ref 3.5–5.1)
Sodium: 133 mmol/L — ABNORMAL LOW (ref 135–145)

## 2015-04-04 LAB — LIPID PANEL
Cholesterol: 138 mg/dL (ref 0–200)
HDL: 36 mg/dL — ABNORMAL LOW (ref >40)
LDL Cholesterol: 61 mg/dL (ref 0–99)
Total CHOL/HDL Ratio: 3.8 RATIO
Triglycerides: 206 mg/dL — ABNORMAL HIGH (ref <150)
VLDL: 41 mg/dL — AB (ref 0–40)

## 2015-04-04 LAB — CBC
HCT: 32 % — ABNORMAL LOW (ref 36.0–46.0)
HEMOGLOBIN: 10.7 g/dL — AB (ref 12.0–15.0)
MCH: 29.5 pg (ref 26.0–34.0)
MCHC: 33.4 g/dL (ref 30.0–36.0)
MCV: 88.2 fL (ref 78.0–100.0)
Platelets: 281 10*3/uL (ref 150–400)
RBC: 3.63 MIL/uL — AB (ref 3.87–5.11)
RDW: 15.1 % (ref 11.5–15.5)
WBC: 8.3 10*3/uL (ref 4.0–10.5)

## 2015-04-04 LAB — HEMOGLOBIN A1C
Hgb A1c MFr Bld: 6.9 % — ABNORMAL HIGH (ref 4.8–5.6)
MEAN PLASMA GLUCOSE: 151 mg/dL

## 2015-04-04 LAB — GLUCOSE, CAPILLARY: Glucose-Capillary: 112 mg/dL — ABNORMAL HIGH (ref 65–99)

## 2015-04-04 MED ORDER — ISOSORBIDE MONONITRATE ER 30 MG PO TB24: 30.0000 mg | ORAL_TABLET | Freq: Every day | ORAL | Status: AC

## 2015-04-04 MED ORDER — NITROGLYCERIN 0.4 MG SL SUBL: 0.4000 mg | SUBLINGUAL_TABLET | SUBLINGUAL | Status: AC | PRN

## 2015-04-04 NOTE — Care Management Note (Signed)
Case Management Note  Patient Details  Name: Katie Fuentes MRN: [id number] Date of Birth: 1933/11/25  Subjective/Objective:   Pt admitted with Non Stemi          Action/Plan:  CM was contacted by [facility] and was informed that pt is active with [facility].  CM requested MD to write resumption of orders.  CM will continue to monitor for disposition needs   Expected Discharge Date:                  Expected Discharge Plan:  [place]  In-House Referral:     Discharge planning Services  CM Consult  Post Acute Care Choice:    Choice offered to:  Patient  DME Arranged:    DME Agency:     HH Arranged:    [place] Agency:  [facility]  Status of Service:   Complete, will sign off  Medicare Important Message Given:  Yes Date Medicare IM Given:   04/04/15 Medicare IM give by:   Elenor Quinones Date Additional Medicare IM Given:    Additional Medicare Important Message give by:     If discussed at [place] of Stay Meetings, dates discussed:    Disposition Plan:  Home with resumption of [facility] services  Additional Comments:  04/04/15 Elenor Quinones, RN, BSN [phone].  CM contacted PA.  PA informed CM that he would speak with pt and write resumption orders for RN and PT.  [facility] is aware.  No additional CM needs at this time    Oneal Grout 04/04/2015, 11:04 AM

## 2015-04-04 NOTE — Progress Notes (Signed)
    Subjective:  No further CP. She was on her ride home from hospital when she felt some discomfort. She questioned if it was the air she was breathing. She called nurse hotline and was told to go to ER.   Objective:  Vital Signs in the last 24 hours: Temp:  [97.9 F (36.6 C)-98.6 F (37 C)] 98 F (36.7 C) (05/12 0442) Pulse Rate:  [91-105] 93 (05/12 0442) Resp:  [18] 18 (05/12 0442) BP: (98-125)/(52-75) 125/53 mmHg (05/12 0442) SpO2:  [93 %-100 %] 93 % (05/12 0442) Weight:  [153 lb 8 oz (69.627 kg)] 153 lb 8 oz (69.627 kg) (05/11 1039)  Intake/Output from previous day: 05/11 0701 - 05/12 0700 In: 720 [P.O.:720] Out: -    Physical Exam: General: Well developed, well nourished, in no acute distress. Head:  Normocephalic and atraumatic. Lungs: Clear to auscultation and percussion. Heart: Normal S1 and S2.  No murmur, rubs or gallops.  Abdomen: soft, non-tender, positive bowel sounds. Extremities: No clubbing or cyanosis. No edema. Neurologic: Alert     Lab Results:  Recent Labs  04/03/15 1210 04/04/15 0450  WBC 8.6 8.3  HGB 11.9* 10.7*  PLT 362 281    Recent Labs  04/03/15 1210 04/04/15 0450  NA 133* 133*  K 3.8 4.6  CL 97* 100*  CO2 24 23  GLUCOSE 145* 125*  BUN 27* 30*  CREATININE 1.49* 1.51*    Recent Labs  04/03/15 1540 04/03/15 2210  TROPONINI 0.06* 0.05*   Hepatic Function Panel  Recent Labs  04/03/15 1210  PROT 6.7  ALBUMIN 3.3*  AST 40  ALT 40  ALKPHOS 60  BILITOT 0.6    Recent Labs  04/04/15 0450  CHOL 138   No results for input(s): PROTIME in the last 72 hours.  Imaging: Dg Chest 2 View  04/03/2015   CLINICAL DATA:  Dyspnea.  EXAM: CHEST  2 VIEW  COMPARISON:  03/31/2015  FINDINGS: There is partial clearance of left base consolidation and effusion since 03/31/2015. A small left effusion remains, and there is minimal left base airspace opacity adjacent to the pleural fluid collection. The right lung is clear. The pulmonary  vasculature is normal. Hilar, mediastinal and cardiac contours appear unremarkable and unchanged  IMPRESSION: Improved, with partial clearance of left base consolidation and effusion since 03/31/2015.   Electronically Signed   By: Andreas Newport M.D.   On: 04/03/2015 00:31   Personally viewed.   Telemetry: No adverse rhythms, NSR LBBB Personally viewed.   EKG:  LBBB NSR  Cardiac Studies:  Cath 60% LM  Assessment/Plan:  Active Problems:   NSTEMI (non-ST elevated myocardial infarction)  -Troponin steady at 0.06 now 0.05. Was 0.18 on 5/8, 0.12 on 4/24 Trending down. -Will DC home with [facility] -She and husband understand increased CV risk (60% LM - mildly elevated trop continuing to drop (likely CHF component). -She is comfortable with plan.    Bristol Soy, [place] 04/04/2015, 9:54 AM

## 2015-04-04 NOTE — Discharge Summary (Signed)
Discharge Summary   Patient ID: Katie Fuentes,  MRN: [id number], DOB/AGE: [birth year] 81 y.o.  Admit date: 04/03/2015 Discharge date: 04/04/2015  Primary Care Provider: PANG,RICHARD Primary Cardiologist: Dr. Haroldine Laws  Discharge Diagnoses Principal Problem:   NSTEMI (non-ST elevated myocardial infarction) Active Problems:   Essential hypertension   Type 2 diabetes mellitus with renal manifestations   LBBB (left bundle branch block)   Chronic renal disease, stage III   Allergies Allergies  Allergen Reactions  . Cefdinir Diarrhea  . Macrobid [Nitrofurantoin Macrocrystal] Nausea And Vomiting and Other (See Comments)    Fever, Chills  . Sulfa Antibiotics Nausea And Vomiting and Other (See Comments)    Fever,chills  . Alphagan [Brimonidine] Other (See Comments)    Redness in the eyes  . Azopt [Brinzolamide]     Redness in the eyes    Hospital Course  The patient is a 81 year old female who was recently admitted on 4/23 and discharged on 04/02/2015. She has a history of HTN, HLD, DM, left bundle branch block, stage III CKD, and history of congestive heart failure. During her recent admission, she was found to have significant left renal artery stenosis and underwent stenting of the renal artery stenosis by Dr. Trula Slade on 03/29/2015. She has a history of left ventricular systolic dysfunction and congestive heart failure and was followed closely during her recent hospitalization on heart failure service. She was diuresed and at the time of her discharge her CVP was low. She had L&RHC on 03/18/2015 which showed heavily calcified left main lesion that appears to be 60-70% stenosed appeared to be okay by IVUS, severe hypertension with 95% left renal artery stenosis, filling pressure with adequate cardiac output. After discharge, she initially felt well, however came back with chest heaviness. Her EKG was unchanged showing left bundle branch block. Troponin was mildly elevated at 0.06. He  have been compliant with aspirin and Plavix at home.  She was admitted overnight for observation. Overnight, her serial troponin has been flat. Imdur was added to her medical regimen. She was seen in the morning of 04/04/2015, at which time she no longer had any further chest pain. She is deemed stable for discharge from cardiology perspective.   Discharge Vitals Blood pressure 122/52, pulse 98, temperature 98 F (36.7 C), temperature source Oral, resp. rate 18, height 5\' 2"  (1.575 m), weight 153 lb 8 oz (69.627 kg), last menstrual period 07/24/1977, SpO2 99 %.  Filed Weights   04/02/15 2349 04/03/15 1039  Weight: 153 lb (69.4 kg) 153 lb 8 oz (69.627 kg)    Labs  CBC  Recent Labs  04/03/15 1210 04/04/15 0450  WBC 8.6 8.3  HGB 11.9* 10.7*  HCT 35.4* 32.0*  MCV 87.2 88.2  PLT 362 563   Basic Metabolic Panel  Recent Labs  04/03/15 1210 04/04/15 0450  NA 133* 133*  K 3.8 4.6  CL 97* 100*  CO2 24 23  GLUCOSE 145* 125*  BUN 27* 30*  CREATININE 1.49* 1.51*  CALCIUM 8.9 8.5*  MG 1.9  --    Liver Function Tests  Recent Labs  04/03/15 1210  AST 40  ALT 40  ALKPHOS 60  BILITOT 0.6  PROT 6.7  ALBUMIN 3.3*   Cardiac Enzymes  Recent Labs  04/03/15 1210 04/03/15 1540 04/03/15 2210  TROPONINI 0.06* 0.06* 0.05*   Hemoglobin A1C  Recent Labs  04/03/15 1210  HGBA1C 6.9*   Fasting Lipid Panel  Recent Labs  04/04/15 0450  CHOL 138  HDL 36*  LDLCALC 61  TRIG 206*  CHOLHDL 3.8   Thyroid Function Tests  Recent Labs  04/03/15 1210  TSH 4.972*    Disposition  Pt is being discharged home today in good condition.  Follow-up Plans & Appointments      Follow-up Information    Follow up with [facility] On 04/09/2015.   Specialty:  Cardiology   Why:  9:40am   Contact information:   [address] [id number] [place] [phone]      Discharge Medications      Medication List    TAKE these medications        ACCU-CHEK AVIVA PLUS test strip  Generic drug:  glucose blood     accu-chek multiclix lancets     aspirin 81 MG tablet  Take 81 mg by mouth at bedtime.     atorvastatin 10 MG tablet  Commonly known as:  LIPITOR  Take 10 mg by mouth daily.     beta carotene 10000 UNIT capsule  Take 10,000 Units by mouth daily.     calcium citrate 950 MG tablet  Commonly known as:  CALCITRATE - dosed in mg elemental calcium  Take 1 tablet by mouth 2 (two) times daily.     carvedilol 3.125 MG tablet  Commonly known as:  COREG  Take 1 tablet (3.125 mg total) by mouth 2 (two) times daily with a meal.     cholecalciferol 1000 UNITS tablet  Commonly known as:  VITAMIN D  Take 1,000 Units by mouth daily.     ciprofloxacin 500 MG tablet  Commonly known as:  CIPRO  Take 1 tablet (500 mg total) by mouth daily with breakfast.     clopidogrel 75 MG tablet  Commonly known as:  PLAVIX  Take 1 tablet (75 mg total) by mouth daily.     docusate sodium 100 MG capsule  Commonly known as:  COLACE  Take 1 capsule (100 mg total) by mouth 2 (two) times daily.     glucosamine-chondroitin 500-400 MG tablet  Take 1 tablet by mouth daily.     glyBURIDE 5 MG tablet  Commonly known as:  DIABETA  Take 5 mg by mouth 2 (two) times daily with a meal.     iron polysaccharides 150 MG capsule  Commonly known as:  NIFEREX  Take 1 capsule (150 mg total) by mouth 2 (two) times daily.     isosorbide mononitrate 30 MG 24 hr tablet  Commonly known as:  IMDUR  Take 1 tablet (30 mg total) by mouth daily.     lisinopril 5 MG tablet  Commonly known as:  PRINIVIL,ZESTRIL  Take 1 tablet (5 mg total) by mouth daily.     METANX PO  Take 1 tablet by mouth daily.     nitroGLYCERIN 0.4 MG SL tablet  Commonly known as:  NITROSTAT  Place 1 tablet (0.4 mg total) under the tongue every 5 (five) minutes x 3 doses as needed for chest pain.     Potassium Chloride ER 20 MEQ  Tbcr  Take 20 mEq by mouth daily.     PROBIOTIC DAILY PO  Take 1 tablet by mouth at bedtime. Florastor     sitaGLIPtin 50 MG tablet  Commonly known as:  JANUVIA  Take 1 tablet (50 mg total) by mouth daily.     spironolactone 25 MG tablet  Commonly known as:  ALDACTONE  Take 0.5 tablets (12.5 mg total) by  mouth daily.     torsemide 20 MG tablet  Commonly known as:  DEMADEX  Take 1 tablet (20 mg total) by mouth daily.     Travoprost (BAK Free) 0.004 % Soln ophthalmic solution  Commonly known as:  TRAVATAN  Place 1 drop into both eyes at bedtime.     vitamin C 1000 MG tablet  Take 1,000 mg by mouth daily.     vitamin E 400 UNIT capsule  Take 400 Units by mouth daily.        Duration of Discharge Encounter   Greater than 30 minutes including physician time.  Hilbert Corrigan PA-C Pager: [phone] 04/04/2015, 10:30 AM

## 2015-04-04 NOTE — Discharge Instructions (Signed)

## 2015-04-05 ENCOUNTER — Other Ambulatory Visit: Payer: Self-pay

## 2015-04-05 DIAGNOSIS — E119 Type 2 diabetes mellitus without complications: Secondary | ICD-10-CM | POA: Diagnosis not present

## 2015-04-05 DIAGNOSIS — E785 Hyperlipidemia, unspecified: Secondary | ICD-10-CM | POA: Diagnosis not present

## 2015-04-05 DIAGNOSIS — I509 Heart failure, unspecified: Secondary | ICD-10-CM | POA: Diagnosis not present

## 2015-04-05 DIAGNOSIS — I10 Essential (primary) hypertension: Secondary | ICD-10-CM | POA: Diagnosis not present

## 2015-04-05 LAB — CULTURE, BLOOD (ROUTINE X 2)
CULTURE: NO GROWTH
Culture: NO GROWTH

## 2015-04-05 NOTE — Patient Outreach (Signed)
[place] [facility]) Care Management  04/05/2015  Katie Fuentes [birth year] [id number]  Assessment: Call for transition of care call. No answer. HIPPA compliant message left.  Plan: Care manager will call next bisiness day.  Thea Silversmith, RN, MSN, [place] Coordinator Cell: [phone]

## 2015-04-08 ENCOUNTER — Other Ambulatory Visit: Payer: Self-pay

## 2015-04-08 DIAGNOSIS — I10 Essential (primary) hypertension: Secondary | ICD-10-CM | POA: Diagnosis not present

## 2015-04-08 DIAGNOSIS — E119 Type 2 diabetes mellitus without complications: Secondary | ICD-10-CM | POA: Diagnosis not present

## 2015-04-08 DIAGNOSIS — E785 Hyperlipidemia, unspecified: Secondary | ICD-10-CM | POA: Diagnosis not present

## 2015-04-08 DIAGNOSIS — I509 Heart failure, unspecified: Secondary | ICD-10-CM | POA: Diagnosis not present

## 2015-04-08 NOTE — Patient Outreach (Signed)
[place] [facility]) Care Management  04/08/2015  Analaura Messler Vohs 14-Nov-1934 [id number]  Transition of care call-Person answering phone stated that member was laying down and unable to come to the phone. Vernon Prey man answering phone stated, member is expecting home health nurse and physical therapist to come later today. Care manager contact name and number provided.  Plan: await return call and follow up tomorrow if no response.  Thea Silversmith, RN, MSN, [place] Coordinator Cell: [phone]

## 2015-04-09 ENCOUNTER — Other Ambulatory Visit: Payer: Self-pay

## 2015-04-09 ENCOUNTER — Ambulatory Visit (HOSPITAL_COMMUNITY)
Admit: 2015-04-09 | Discharge: 2015-04-09 | Disposition: A | Discharge status: Home / Self Care | Payer: Medicare Other | Source: Ambulatory Visit | Attending: Cardiology | Admitting: Cardiology

## 2015-04-09 VITALS — BP 112/50 | HR 103 | Wt 153.2 lb

## 2015-04-09 DIAGNOSIS — N183 Chronic kidney disease, stage 3 unspecified: Secondary | ICD-10-CM

## 2015-04-09 DIAGNOSIS — E119 Type 2 diabetes mellitus without complications: Secondary | ICD-10-CM | POA: Diagnosis not present

## 2015-04-09 DIAGNOSIS — I340 Nonrheumatic mitral (valve) insufficiency: Secondary | ICD-10-CM | POA: Insufficient documentation

## 2015-04-09 DIAGNOSIS — I5022 Chronic systolic (congestive) heart failure: Secondary | ICD-10-CM | POA: Diagnosis not present

## 2015-04-09 DIAGNOSIS — Z79899 Other long term (current) drug therapy: Secondary | ICD-10-CM | POA: Insufficient documentation

## 2015-04-09 DIAGNOSIS — I701 Atherosclerosis of renal artery: Secondary | ICD-10-CM | POA: Diagnosis not present

## 2015-04-09 DIAGNOSIS — I2510 Atherosclerotic heart disease of native coronary artery without angina pectoris: Secondary | ICD-10-CM

## 2015-04-09 DIAGNOSIS — Z87891 Personal history of nicotine dependence: Secondary | ICD-10-CM | POA: Diagnosis not present

## 2015-04-09 DIAGNOSIS — N189 Chronic kidney disease, unspecified: Secondary | ICD-10-CM | POA: Diagnosis not present

## 2015-04-09 DIAGNOSIS — I5041 Acute combined systolic (congestive) and diastolic (congestive) heart failure: Secondary | ICD-10-CM

## 2015-04-09 DIAGNOSIS — Z7902 Long term (current) use of antithrombotics/antiplatelets: Secondary | ICD-10-CM | POA: Diagnosis not present

## 2015-04-09 DIAGNOSIS — E785 Hyperlipidemia, unspecified: Secondary | ICD-10-CM | POA: Diagnosis not present

## 2015-04-09 DIAGNOSIS — I129 Hypertensive chronic kidney disease with stage 1 through stage 4 chronic kidney disease, or unspecified chronic kidney disease: Secondary | ICD-10-CM | POA: Insufficient documentation

## 2015-04-09 DIAGNOSIS — Z7982 Long term (current) use of aspirin: Secondary | ICD-10-CM | POA: Insufficient documentation

## 2015-04-09 LAB — BASIC METABOLIC PANEL
ANION GAP: 11 (ref 5–15)
BUN: 54 mg/dL — ABNORMAL HIGH (ref 6–20)
CALCIUM: 9.1 mg/dL (ref 8.9–10.3)
CO2: 22 mmol/L (ref 22–32)
CREATININE: 1.93 mg/dL — AB (ref 0.44–1.00)
Chloride: 100 mmol/L — ABNORMAL LOW (ref 101–111)
GFR calc non Af Amer: 24 mL/min — ABNORMAL LOW (ref >60)
GFR, EST AFRICAN AMERICAN: 27 mL/min — AB (ref >60)
Glucose, Bld: 152 mg/dL — ABNORMAL HIGH (ref 65–99)
POTASSIUM: 5.2 mmol/L — AB (ref 3.5–5.1)
SODIUM: 133 mmol/L — AB (ref 135–145)

## 2015-04-09 LAB — BRAIN NATRIURETIC PEPTIDE: B NATRIURETIC PEPTIDE 5: 323 pg/mL — AB (ref 0.0–100.0)

## 2015-04-09 MED ORDER — SPIRONOLACTONE 25 MG PO TABS: 25.0000 mg | ORAL_TABLET | Freq: Every day | ORAL | Status: DC

## 2015-04-09 MED ORDER — CARVEDILOL 6.25 MG PO TABS: 6.2500 mg | ORAL_TABLET | Freq: Two times a day (BID) | ORAL | Status: DC

## 2015-04-09 NOTE — Progress Notes (Signed)
Patient ID: Katie Fuentes, female   DOB: 22-May-1934, 81 y.o.   MRN: [id number] PCP: Dr. Minna Antis  81 yo with history of CAD, renal artery stenosis, CKD, and primarily nonischemic cardiomyopathy presents for cardiology followup.  Patient initially went to the ER in 3/16 with dyspnea, troponin of 1.18, creatinine 1.87.  Echo showed EF 30-35% with diffuse hypokinesis and moderate-severe presumed functional MR.  Cardiolite showed EF 22% with no ischemia.  She was diuresed and discharged home.  She was re-admitted in 4/16 with recurrent CHF.  RHC/LHC showed preserved cardiac output and a 60% left main stenosis that was likely not hemodynamically significant by IVUS.  She developed AKI on CKD after cardiac cath.  She also had worsening volume overload.  Also noted by cath was 95% left renal artery stenosis that was treated with PCI by Dr Trula Slade.  After renal artery stent, she developed anemia and had 2 units PRBCs transfused.  After a long hospital stay, she was discharged home.    She returns for followup.  She seems to be doing fairly well.  She has home PT coming in and is walking with a walker.  No dyspnea walking around her house but not very active yet.  No orthopnea/PND.  No chest pain, no lightheadedness or syncope.   Taking all meds.   Labs (5/16): K 4.6, creatinine 1.5, LDL 61, HDL 36    PMH: 1. HTN 2. Type II diabetes 3. Hyperlipidemia 4. Renal artery stenosis: 95% on left, s/p left renal artery stent 5/16 ([facility]). 5. LBBB 6. CKD 7. CAD: Cardiolite (3/16) with no ischemia, EF 22%.  LHC (4/16) with 60% left main stenosis.  IVUS suggested that this was not hemodynamically significant (6.1 mm^2).   8. Chronic systolic CHF: Suspect primarily nonischemic.  Echo (3/16) with EF 30-35%, global hypokinesis, moderate-severe functional MR.  RHC (4/16) with mean RA 2, PA 31/11, mean PCWP 12, CI 2.8.   9. Mitral regurgitation: Moderate-severe presumed functional MR on echo in 3/16.   SH: Married,  lives in [place], remote tobacco, retired.    FH: Brother with CABG.   ROS: All systems reviewed and negative except as per HPI.   Current Outpatient Prescriptions  Medication Sig Dispense Refill  . ACCU-CHEK AVIVA PLUS test strip     . Ascorbic Acid (VITAMIN C) 1000 MG tablet Take 1,000 mg by mouth daily.    Marland Kitchen aspirin 81 MG tablet Take 81 mg by mouth at bedtime.     Marland Kitchen atorvastatin (LIPITOR) 10 MG tablet Take 10 mg by mouth daily.    . beta carotene 10000 UNIT capsule Take 10,000 Units by mouth daily.    . calcium citrate (CALCITRATE - DOSED IN MG ELEMENTAL CALCIUM) 950 MG tablet Take 1 tablet by mouth 2 (two) times daily.     . carvedilol (COREG) 6.25 MG tablet Take 1 tablet (6.25 mg total) by mouth 2 (two) times daily with a meal. 60 tablet 3  . cholecalciferol (VITAMIN D) 1000 UNITS tablet Take 1,000 Units by mouth daily.    . clopidogrel (PLAVIX) 75 MG tablet Take 1 tablet (75 mg total) by mouth daily. 30 tablet 6  . docusate sodium (COLACE) 100 MG capsule Take 1 capsule (100 mg total) by mouth 2 (two) times daily. 60 capsule 0  . glucosamine-chondroitin 500-400 MG tablet Take 1 tablet by mouth daily.     Marland Kitchen glyBURIDE (DIABETA) 5 MG tablet Take 5 mg by mouth 2 (two) times daily with a meal.     .  iron polysaccharides (NIFEREX) 150 MG capsule Take 1 capsule (150 mg total) by mouth 2 (two) times daily. 60 capsule 2  . isosorbide mononitrate (IMDUR) 30 MG 24 hr tablet Take 1 tablet (30 mg total) by mouth daily. 90 tablet 3  . L-Methylfolate-B6-B12 (METANX PO) Take 1 tablet by mouth daily.     . Lancets (ACCU-CHEK MULTICLIX) lancets     . lisinopril (PRINIVIL,ZESTRIL) 5 MG tablet Take 1 tablet (5 mg total) by mouth daily. 30 tablet 6  . nitroGLYCERIN (NITROSTAT) 0.4 MG SL tablet Place 1 tablet (0.4 mg total) under the tongue every 5 (five) minutes x 3 doses as needed for chest pain. 25 tablet 3  . Probiotic Product (PROBIOTIC DAILY PO) Take 1 tablet by mouth at bedtime. Florastor    .  sitaGLIPtin (JANUVIA) 50 MG tablet Take 1 tablet (50 mg total) by mouth daily. (Patient taking differently: Take 50 mg by mouth 2 (two) times daily. ) 30 tablet 2  . spironolactone (ALDACTONE) 25 MG tablet Take 1 tablet (25 mg total) by mouth daily. 30 tablet 6  . torsemide (DEMADEX) 20 MG tablet Take 1 tablet (20 mg total) by mouth daily. 30 tablet 6  . Travoprost, BAK Free, (TRAVATAN) 0.004 % SOLN ophthalmic solution Place 1 drop into both eyes at bedtime.    . vitamin E 400 UNIT capsule Take 400 Units by mouth daily.     No current facility-administered medications for this encounter.   BP 112/50 mmHg  Pulse 103  Wt 153 lb 4 oz (69.514 kg)  SpO2 100%  LMP 07/24/1977 General: NAD Neck: No JVD, no thyromegaly or thyroid nodule.  Lungs: Clear to auscultation bilaterally with normal respiratory effort. CV: Nondisplaced PMI.  Heart regular S1/S2, no S3/S4, no murmur.  1+ ankle edema bilaterally.  No carotid bruit.  Normal pedal pulses.  Abdomen: Soft, nontender, no hepatosplenomegaly, no distention.  Skin: Intact without lesions or rashes.  Neurologic: Alert and oriented x 3.  Psych: Normal affect. Extremities: No clubbing or cyanosis.  HEENT: Normal.   Assessment/Plan: 1. Chronic systolic CHF: Probably primarily nonischemic cardiomyopathy.  She has moderate left main stenosis but no ischemia by recent Cardiolite and IVUS suggested that the left main stenosis is probably not hemodynamically significant.  It is possible that the cardiomyopathy is due to long-standing LBBB.  It could also be related to long-standing HTN.  She is euvolemic on exam today.  NYHA class II symptoms (but not very active yet).  - Continue home PT, would like to eventually get her into cardiac rehab.   - Increase Coreg to 6.25 mg bid, increase spironolactone to 25 mg daily.  Can stop KCl supplement.  - Continue current Lasix and torsemide.  - BMET/BNP today and repeat in 1 week with change in spironolactone dosing.    - Echo in 3-4 months after aggressive medication titration. If EF remains < 35%, would arrange for CRT-D placement.   2. CAD: 60% left main stenosis on 4/16 cath, probably not hemodynamically significant (based on IVUS at time of cath and Cardiolite in 3/16 with no ischemia).  No chest pain.  - Continue ASA 81, Plavix, statin.  Recent LDL was excellent at 61.  3. CKD: Check BMET today.  Has followup with Dr Lorrene Reid.  4. Renal artery stenosis: s/p left renal artery stent.  Will need followup with Dr Trula Slade.  Would expect to continue dual antiplatelet therapy for several months then transition over to aspirin alone.   Loralie Champagne 04/09/2015

## 2015-04-09 NOTE — Patient Instructions (Signed)
INCREASE Coreg to 6.25 mg, one tab twice a day INCREASE Spironolactone 25mg  one tab daily STOP Potassium  Labs today and again in one week (BMET)  Your physician recommends that you schedule a follow-up appointment in: 2 weeks  Do the following things EVERYDAY: 1) Weigh yourself in the morning before breakfast. Write it down and keep it in a log. 2) Take your medicines as prescribed 3) Eat low salt foods-Limit salt (sodium) to 2000 mg per day.  4) Stay as active as you can everyday 5) Limit all fluids for the day to less than 2 liters 6)

## 2015-04-09 NOTE — Patient Outreach (Signed)
[place] [facility]) Care Management  04/09/2015  Katie Fuentes [birth year] [id number]  Transition of care call-Member reports visit with cardiologist today. Member states she is feeling better. Denies shortness of breath, denies any edema. Member is active with [facility]. Care Manager discussed difference in home health and care management services. Discussed 24 hour nurse advice line.  Plan: home visit next week to complete initial assessment.  Thea Silversmith, RN, MSN, [place] Coordinator Cell: [phone]

## 2015-04-10 DIAGNOSIS — I509 Heart failure, unspecified: Secondary | ICD-10-CM | POA: Diagnosis not present

## 2015-04-10 DIAGNOSIS — E119 Type 2 diabetes mellitus without complications: Secondary | ICD-10-CM | POA: Diagnosis not present

## 2015-04-10 DIAGNOSIS — E785 Hyperlipidemia, unspecified: Secondary | ICD-10-CM | POA: Diagnosis not present

## 2015-04-10 DIAGNOSIS — I10 Essential (primary) hypertension: Secondary | ICD-10-CM | POA: Diagnosis not present

## 2015-04-15 ENCOUNTER — Other Ambulatory Visit: Payer: Self-pay

## 2015-04-15 NOTE — Patient Outreach (Signed)
[place] [facility]) Care Management  04/15/2015  Amorita Vanrossum Corbo Aug 30, 1934 [id number]   Assessment: Care Manager received a message that member is requesting to change date of appointment. Care manager called to assess and reschedule appointment. Per member's husband, member is "not available". Contact information provided.  Plan: Will call member if no return call received by the end of the week.  Thea Silversmith, RN, MSN, [place] Coordinator Cell: [phone]

## 2015-04-16 DIAGNOSIS — I509 Heart failure, unspecified: Secondary | ICD-10-CM | POA: Diagnosis not present

## 2015-04-16 DIAGNOSIS — I10 Essential (primary) hypertension: Secondary | ICD-10-CM | POA: Diagnosis not present

## 2015-04-16 DIAGNOSIS — E119 Type 2 diabetes mellitus without complications: Secondary | ICD-10-CM | POA: Diagnosis not present

## 2015-04-16 DIAGNOSIS — E785 Hyperlipidemia, unspecified: Secondary | ICD-10-CM | POA: Diagnosis not present

## 2015-04-17 ENCOUNTER — Telehealth (HOSPITAL_COMMUNITY): Payer: Self-pay | Admitting: Vascular Surgery

## 2015-04-17 ENCOUNTER — Ambulatory Visit (HOSPITAL_COMMUNITY)
Admission: RE | Admit: 2015-04-17 | Discharge: 2015-04-17 | Disposition: A | Discharge status: Home / Self Care | Payer: Medicare Other | Source: Ambulatory Visit | Attending: Internal Medicine | Admitting: Internal Medicine

## 2015-04-17 DIAGNOSIS — I5041 Acute combined systolic (congestive) and diastolic (congestive) heart failure: Secondary | ICD-10-CM | POA: Insufficient documentation

## 2015-04-17 DIAGNOSIS — I5022 Chronic systolic (congestive) heart failure: Secondary | ICD-10-CM | POA: Diagnosis not present

## 2015-04-17 LAB — BASIC METABOLIC PANEL
ANION GAP: 11 (ref 5–15)
BUN: 66 mg/dL — ABNORMAL HIGH (ref 6–20)
CO2: 24 mmol/L (ref 22–32)
Calcium: 9.3 mg/dL (ref 8.9–10.3)
Chloride: 101 mmol/L (ref 101–111)
Creatinine, Ser: 1.9 mg/dL — ABNORMAL HIGH (ref 0.44–1.00)
GFR calc non Af Amer: 24 mL/min — ABNORMAL LOW (ref >60)
GFR, EST AFRICAN AMERICAN: 28 mL/min — AB (ref >60)
Glucose, Bld: 173 mg/dL — ABNORMAL HIGH (ref 65–99)
Potassium: 4.8 mmol/L (ref 3.5–5.1)
Sodium: 136 mmol/L (ref 135–145)

## 2015-04-17 NOTE — Telephone Encounter (Signed)
Physical therapist... report blurred visit and feeling tired for 2 hours therapy bp drop .Marland Kitchen No dizziness

## 2015-04-18 ENCOUNTER — Telehealth (HOSPITAL_COMMUNITY): Payer: Self-pay

## 2015-04-18 ENCOUNTER — Ambulatory Visit: Payer: Medicare Other

## 2015-04-18 DIAGNOSIS — E1165 Type 2 diabetes mellitus with hyperglycemia: Secondary | ICD-10-CM | POA: Diagnosis not present

## 2015-04-18 DIAGNOSIS — N289 Disorder of kidney and ureter, unspecified: Secondary | ICD-10-CM | POA: Diagnosis not present

## 2015-04-18 DIAGNOSIS — E780 Pure hypercholesterolemia: Secondary | ICD-10-CM | POA: Diagnosis not present

## 2015-04-18 DIAGNOSIS — E119 Type 2 diabetes mellitus without complications: Secondary | ICD-10-CM | POA: Diagnosis not present

## 2015-04-18 DIAGNOSIS — E785 Hyperlipidemia, unspecified: Secondary | ICD-10-CM | POA: Diagnosis not present

## 2015-04-18 DIAGNOSIS — I10 Essential (primary) hypertension: Secondary | ICD-10-CM | POA: Diagnosis not present

## 2015-04-18 DIAGNOSIS — R350 Frequency of micturition: Secondary | ICD-10-CM | POA: Diagnosis not present

## 2015-04-18 DIAGNOSIS — I509 Heart failure, unspecified: Secondary | ICD-10-CM | POA: Diagnosis not present

## 2015-04-18 NOTE — Telephone Encounter (Signed)
Havery Moros, case manager of medicare, left VM on triage line to request patient's latest EF%.  Latest echo from 01/2015 faxed to confidential fax # as given by Havery Moros on VM [phone]

## 2015-04-18 NOTE — Telephone Encounter (Signed)
spoke with pt and Beth, PT with [facility] during PT session  Pt still has some B/P changes with positional changes however pt is asymptomatic B/p drops some while standing  medications reviewed/vitals from last office visit    as long as pt remains asymptomatic we can address at next office visit If pt does become symptomatic pt should call office for further instructions

## 2015-04-19 ENCOUNTER — Other Ambulatory Visit: Payer: Self-pay

## 2015-04-19 NOTE — Patient Outreach (Signed)
[place] [facility]) Care Management  04/19/2015  Katie Fuentes 1934-11-12 [id number]  Transition of care call-member reports feeling better and confirms that she is still [facility] with [facility]. Member reports she spoke with [facility] staff regarding their heart failure program and states she is receptive to tele-monitoring system. Care Manager reinforced that that tele-monitoring system is an excellent benefit afforded through her plan. Member states her primary care physician has left the practice. Member was seen last week in office, but states she does not have a specific primary care physician established. Member states that her daughter and son are going to assist with finding another primary care physician, but would like to stay in the same practice.   Plan: contact primary care provider regarding primary care physician, home visit next week.  Thea Silversmith, RN, MSN, [place] Coordinator Cell: [phone]

## 2015-04-22 DIAGNOSIS — E785 Hyperlipidemia, unspecified: Secondary | ICD-10-CM | POA: Diagnosis not present

## 2015-04-22 DIAGNOSIS — E119 Type 2 diabetes mellitus without complications: Secondary | ICD-10-CM | POA: Diagnosis not present

## 2015-04-22 DIAGNOSIS — I10 Essential (primary) hypertension: Secondary | ICD-10-CM | POA: Diagnosis not present

## 2015-04-22 DIAGNOSIS — I509 Heart failure, unspecified: Secondary | ICD-10-CM | POA: Diagnosis not present

## 2015-04-23 DIAGNOSIS — D649 Anemia, unspecified: Secondary | ICD-10-CM | POA: Diagnosis not present

## 2015-04-23 DIAGNOSIS — E1122 Type 2 diabetes mellitus with diabetic chronic kidney disease: Secondary | ICD-10-CM | POA: Diagnosis not present

## 2015-04-23 DIAGNOSIS — I10 Essential (primary) hypertension: Secondary | ICD-10-CM | POA: Diagnosis not present

## 2015-04-23 DIAGNOSIS — I214 Non-ST elevation (NSTEMI) myocardial infarction: Secondary | ICD-10-CM | POA: Diagnosis not present

## 2015-04-23 DIAGNOSIS — I5021 Acute systolic (congestive) heart failure: Secondary | ICD-10-CM | POA: Diagnosis not present

## 2015-04-23 DIAGNOSIS — N183 Chronic kidney disease, stage 3 (moderate): Secondary | ICD-10-CM | POA: Diagnosis not present

## 2015-04-24 ENCOUNTER — Other Ambulatory Visit: Payer: Self-pay

## 2015-04-24 ENCOUNTER — Ambulatory Visit (HOSPITAL_COMMUNITY)
Admission: RE | Admit: 2015-04-24 | Discharge: 2015-04-24 | Disposition: A | Discharge status: Home / Self Care | Payer: Medicare Other | Source: Ambulatory Visit | Attending: Cardiology | Admitting: Cardiology

## 2015-04-24 VITALS — BP 132/52 | HR 99 | Wt 149.2 lb

## 2015-04-24 VITALS — BP 98/58 | HR 79 | Resp 20 | Ht 63.0 in | Wt 146.0 lb

## 2015-04-24 DIAGNOSIS — I447 Left bundle-branch block, unspecified: Secondary | ICD-10-CM | POA: Diagnosis not present

## 2015-04-24 DIAGNOSIS — I701 Atherosclerosis of renal artery: Secondary | ICD-10-CM | POA: Diagnosis not present

## 2015-04-24 DIAGNOSIS — I5041 Acute combined systolic (congestive) and diastolic (congestive) heart failure: Secondary | ICD-10-CM | POA: Diagnosis not present

## 2015-04-24 DIAGNOSIS — Z7902 Long term (current) use of antithrombotics/antiplatelets: Secondary | ICD-10-CM | POA: Insufficient documentation

## 2015-04-24 DIAGNOSIS — I2510 Atherosclerotic heart disease of native coronary artery without angina pectoris: Secondary | ICD-10-CM | POA: Insufficient documentation

## 2015-04-24 DIAGNOSIS — E119 Type 2 diabetes mellitus without complications: Secondary | ICD-10-CM | POA: Insufficient documentation

## 2015-04-24 DIAGNOSIS — Z7189 Other specified counseling: Secondary | ICD-10-CM

## 2015-04-24 DIAGNOSIS — N189 Chronic kidney disease, unspecified: Secondary | ICD-10-CM | POA: Insufficient documentation

## 2015-04-24 DIAGNOSIS — Z7982 Long term (current) use of aspirin: Secondary | ICD-10-CM | POA: Insufficient documentation

## 2015-04-24 DIAGNOSIS — Z79899 Other long term (current) drug therapy: Secondary | ICD-10-CM | POA: Diagnosis not present

## 2015-04-24 DIAGNOSIS — I5022 Chronic systolic (congestive) heart failure: Secondary | ICD-10-CM | POA: Insufficient documentation

## 2015-04-24 DIAGNOSIS — I129 Hypertensive chronic kidney disease with stage 1 through stage 4 chronic kidney disease, or unspecified chronic kidney disease: Secondary | ICD-10-CM | POA: Diagnosis not present

## 2015-04-24 DIAGNOSIS — I429 Cardiomyopathy, unspecified: Secondary | ICD-10-CM | POA: Diagnosis not present

## 2015-04-24 DIAGNOSIS — E785 Hyperlipidemia, unspecified: Secondary | ICD-10-CM | POA: Insufficient documentation

## 2015-04-24 DIAGNOSIS — H538 Other visual disturbances: Secondary | ICD-10-CM | POA: Diagnosis not present

## 2015-04-24 MED ORDER — CARVEDILOL 12.5 MG PO TABS: 12.5000 mg | ORAL_TABLET | Freq: Two times a day (BID) | ORAL | Status: AC

## 2015-04-24 NOTE — Patient Instructions (Signed)
INCREASE Carvedilol (Coreg) accordingly... Thursday 04/25/15-Thursday 05/02/15  9.375 mg (1.5 tablets) twice daily for ONE WEEK  Friday 05/03/15-continually 12.5 mg (new prescription will be 1 whole tablet) twice daily.  Follow up in 2 weeks for lab work.  Return in 1 month for routine appointment.  Do the following things EVERYDAY: 1) Weigh yourself in the morning before breakfast. Write it down and keep it in a log. 2) Take your medicines as prescribed 3) Eat low salt foods-Limit salt (sodium) to 2000 mg per day.  4) Stay as active as you can everyday 5) Limit all fluids for the day to less than 2 liters

## 2015-04-24 NOTE — Patient Outreach (Signed)
Katie Fuentes) Care Management   04/24/2015  Katie Fuentes [birth year] [id number]  Katie Fuentes is an 81 y.o. female  Subjective: Member without complaints, denies shortness of breath. Denies edema. Member reports she continues to weigh self daily.  Objective: BP 98/58 mmHg  Pulse 79  Resp 20  Ht 1.6 m (_0 )  Wt 146 lb (66.225 kg)  BMI 25.87 kg/m2  SpO2 97%  LMP 07/24/1977 Review of Systems  Respiratory:       Lungs clear  bilaterally  Cardiovascular:       Pulse rate regular, no edema note.  Skin:       Skin warm, dry intact.    Physical Exam  Current Medications:   Current Outpatient Prescriptions  Medication Sig Dispense Refill  . ACCU-CHEK AVIVA PLUS test strip     . aspirin 81 MG tablet Take 81 mg by mouth at bedtime.     Marland Kitchen atorvastatin (LIPITOR) 10 MG tablet Take 10 mg by mouth daily.    . clopidogrel (PLAVIX) 75 MG tablet Take 1 tablet (75 mg total) by mouth daily. 30 tablet 6  . glyBURIDE (DIABETA) 5 MG tablet Take 5 mg by mouth 2 (two) times daily with a meal.     . iron polysaccharides (NIFEREX) 150 MG capsule Take 1 capsule (150 mg total) by mouth 2 (two) times daily. 60 capsule 2  . isosorbide mononitrate (IMDUR) 30 MG 24 hr tablet Take 1 tablet (30 mg total) by mouth daily. 90 tablet 3  . L-Methylfolate-B6-B12 (METANX PO) Take 1 tablet by mouth daily.     . Lancets (ACCU-CHEK MULTICLIX) lancets     . lisinopril (PRINIVIL,ZESTRIL) 5 MG tablet Take 1 tablet (5 mg total) by mouth daily. 30 tablet 6  . nitroGLYCERIN (NITROSTAT) 0.4 MG SL tablet Place 1 tablet (0.4 mg total) under the tongue every 5 (five) minutes x 3 doses as needed for chest pain. 25 tablet 3  . sitaGLIPtin (JANUVIA) 50 MG tablet Take 1 tablet (50 mg total) by mouth daily. 30 tablet 2  . Travoprost, BAK Free, (TRAVATAN) 0.004 % SOLN ophthalmic solution Place 1 drop into both eyes at bedtime.    . Ascorbic Acid (VITAMIN C) 1000 MG tablet Take 1,000 mg by mouth daily.     . beta carotene 10000 UNIT capsule Take 10,000 Units by mouth daily.    . calcium citrate (CALCITRATE - DOSED IN MG ELEMENTAL CALCIUM) 950 MG tablet Take 1 tablet by mouth 2 (two) times daily.     . carvedilol (COREG) 12.5 MG tablet Take 1 tablet (12.5 mg total) by mouth 2 (two) times daily with a meal. 60 tablet 6  . cholecalciferol (VITAMIN D) 1000 UNITS tablet Take 1,000 Units by mouth daily.    Marland Kitchen docusate sodium (COLACE) 100 MG capsule Take 100 mg by mouth daily.    Marland Kitchen glucosamine-chondroitin 500-400 MG tablet Take 1 tablet by mouth daily.     . Probiotic Product (PROBIOTIC DAILY PO) Take 1 tablet by mouth at bedtime. Florastor    . spironolactone (ALDACTONE) 25 MG tablet Take 12.5 mg by mouth daily.    Marland Kitchen torsemide (DEMADEX) 20 MG tablet Take 20 mg by mouth every other day.    . vitamin E 400 UNIT capsule Take 400 Units by mouth daily.     No current facility-administered medications for this visit.    Functional Status:   In your present state of health, do you have any difficulty performing the  following activities: 04/24/2015 04/03/2015  Hearing? Tempie Donning  Vision? N N  Difficulty concentrating or making decisions? N N  Walking or climbing stairs? N Y  Dressing or bathing? N N  Doing errands, shopping? Y N  Preparing Food and eating ? N -  Using the Toilet? N -  In the past six months, have you accidently leaked urine? N -  Do you have problems with loss of bowel control? N -  Managing your Medications? N -  Managing your Finances? N -  Housekeeping or managing your Housekeeping? Y -    Fall/Depression Screening:    PHQ 2/9 Scores 04/24/2015  PHQ - 2 Score 0    Assessment: 81 year old with history of heart failure, flash pulmonary edema, myocardial infarction, hypertension, chronic kidney disease, diabetes. Member currently in the transition of care program following admission for heart failure and myocardial infarction. Home visit completed. Present during visit was also member's  husband. Member is currently active with home health for nursing and physical therapy with [facility].   Heart failure-Member continues to weigh self daily and record weights. Both member and husband express action plan to call if weight is over 2 pounds and states that her weight has not had this increase. Member is adherent to medication regimen. Member continues to express interest in education regarding heart failure management. Member has a scheduled visit with the heart failure clinic today for follow up. Member is agreeable to viewing EMMI educational material.  History of diabetes: Member reports last hemoglobin A1C is 7. Member is on oral agents. 7day average 98, 14 day average 93, 30 day average 93. Member reports she has had education on diabetes management/Not an issue for member at this time.   Plan:  Telephonic transition of care call next week. Social Work referral for advanced directive information. Assign EMMI educational material.  [facility] CM Care Plan Problem One        Patient Outreach from 04/24/2015 in [place] Problem One  at risk for readmission   Care Plan for Problem One  Active   [facility] Long Term Goal (31-90 days)  Member will not be readmitted within the next 31 days.   [facility] Long Term Goal Start Date  04/09/15   Interventions for Problem One Long Term Goal  provided [facility] calender/organizer, reinforced heart failure zone tool   [facility] CM Short Term Goal #1 (0-30 days)  Member will schedule appointment with new primary care within the next 30 days.   [facility] CM Short Term Goal #1 Start Date  04/09/15   [facility] CM Short Term Goal #1 Met Date  04/24/15 [states provider changed on insurance card, saw new physician]   [facility] CM Short Term Goal #2 (0-30 days)  Member will verbalize at least three signs/symptoms of heart failure exacerbation and when to call the doctor within the next 30days.   [facility] CM Short Term Goal #2 Start Date  04/09/15   Interventions for  Short Term Goal #2  discussed heart failure zone tool, prescribe EMMI video.

## 2015-04-25 NOTE — Progress Notes (Signed)
Patient ID: Katie Fuentes, female   DOB: [birth year], 81 y.o.   MRN: [id number] PCP: Dr. Minna Antis  81 yo with history of CAD, renal artery stenosis, CKD, and primarily nonischemic cardiomyopathy presents for cardiology followup.  Patient initially went to the ER in 3/16 with dyspnea, troponin of 1.18, creatinine 1.87.  Echo showed EF 30-35% with diffuse hypokinesis and moderate-severe presumed functional MR.  Cardiolite showed EF 22% with no ischemia.  She was diuresed and discharged home.  She was re-admitted in 4/16 with recurrent CHF.  RHC/LHC showed preserved cardiac output and a 60% left main stenosis that was likely not hemodynamically significant by IVUS.  She developed AKI on CKD after cardiac cath.  She also had worsening volume overload.  Also noted by cath was 95% left renal artery stenosis that was treated with PCI by Dr Trula Slade.  After renal artery stent, she developed anemia and had 2 units PRBCs transfused.  After a long hospital stay, she was discharged home.    She returns for followup.  No dyspnea walking around her house but not very active yet.  No orthopnea/PND.  She had 1 day where she had chest pressure for a couple of hours with no particular trigger.  No chest pain prior or since.  No lightheadedness or syncope.   She has noted some visual blurring recently and wonders if it is one of her medications.  Taking all meds.  Spironolactone was decreased to 12.5 mg daily with elevated K.  Weight is down 4 lbs.   Labs (5/16): K 4.6 => 5.2 => 4.8, creatinine 1.5 => 1.9, LDL 61, HDL 36    PMH: 1. HTN 2. Type II diabetes 3. Hyperlipidemia 4. Renal artery stenosis: 95% on left, s/p left renal artery stent 5/16 ([place]). 5. LBBB 6. CKD 7. CAD: Cardiolite (3/16) with no ischemia, EF 22%.  LHC (4/16) with 60% left main stenosis.  IVUS suggested that this was not hemodynamically significant (6.1 mm^2).   8. Chronic systolic CHF: Suspect primarily nonischemic.  Echo (3/16) with EF 30-35%,  global hypokinesis, moderate-severe functional MR.  RHC (4/16) with mean RA 2, PA 31/11, mean PCWP 12, CI 2.8.   9. Mitral regurgitation: Moderate-severe presumed functional MR on echo in 3/16.   SH: Married, lives in [place], remote tobacco, retired.    FH: Brother with CABG.   ROS: All systems reviewed and negative except as per HPI.   Current Outpatient Prescriptions  Medication Sig Dispense Refill  . ACCU-CHEK AVIVA PLUS test strip     . Ascorbic Acid (VITAMIN C) 1000 MG tablet Take 1,000 mg by mouth daily.    Marland Kitchen aspirin 81 MG tablet Take 81 mg by mouth at bedtime.     Marland Kitchen atorvastatin (LIPITOR) 10 MG tablet Take 10 mg by mouth daily.    . beta carotene 10000 UNIT capsule Take 10,000 Units by mouth daily.    . calcium citrate (CALCITRATE - DOSED IN MG ELEMENTAL CALCIUM) 950 MG tablet Take 1 tablet by mouth 2 (two) times daily.     . carvedilol (COREG) 12.5 MG tablet Take 1 tablet (12.5 mg total) by mouth 2 (two) times daily with a meal. 60 tablet 6  . cholecalciferol (VITAMIN D) 1000 UNITS tablet Take 1,000 Units by mouth daily.    . clopidogrel (PLAVIX) 75 MG tablet Take 1 tablet (75 mg total) by mouth daily. 30 tablet 6  . docusate sodium (COLACE) 100 MG capsule Take 100 mg by mouth daily.    Marland Kitchen  glucosamine-chondroitin 500-400 MG tablet Take 1 tablet by mouth daily.     Marland Kitchen glyBURIDE (DIABETA) 5 MG tablet Take 5 mg by mouth 2 (two) times daily with a meal.     . iron polysaccharides (NIFEREX) 150 MG capsule Take 1 capsule (150 mg total) by mouth 2 (two) times daily. 60 capsule 2  . isosorbide mononitrate (IMDUR) 30 MG 24 hr tablet Take 1 tablet (30 mg total) by mouth daily. 90 tablet 3  . L-Methylfolate-B6-B12 (METANX PO) Take 1 tablet by mouth daily.     Marland Kitchen lisinopril (PRINIVIL,ZESTRIL) 5 MG tablet Take 1 tablet (5 mg total) by mouth daily. 30 tablet 6  . nitroGLYCERIN (NITROSTAT) 0.4 MG SL tablet Place 1 tablet (0.4 mg total) under the tongue every 5 (five) minutes x 3 doses as needed  for chest pain. 25 tablet 3  . Probiotic Product (PROBIOTIC DAILY PO) Take 1 tablet by mouth at bedtime. Florastor    . sitaGLIPtin (JANUVIA) 50 MG tablet Take 1 tablet (50 mg total) by mouth daily. 30 tablet 2  . spironolactone (ALDACTONE) 25 MG tablet Take 12.5 mg by mouth daily.    Marland Kitchen torsemide (DEMADEX) 20 MG tablet Take 20 mg by mouth every other day.    . Travoprost, BAK Free, (TRAVATAN) 0.004 % SOLN ophthalmic solution Place 1 drop into both eyes at bedtime.    . vitamin E 400 UNIT capsule Take 400 Units by mouth daily.    . Lancets (ACCU-CHEK MULTICLIX) lancets      No current facility-administered medications for this encounter.   BP 132/52 mmHg  Pulse 99  Wt 149 lb 4 oz (67.699 kg)  SpO2 98%  LMP 07/24/1977 General: NAD Neck: No JVD, no thyromegaly or thyroid nodule.  Lungs: Clear to auscultation bilaterally with normal respiratory effort. CV: Nondisplaced PMI.  Heart regular S1/S2, no S3/S4, no murmur.  1+ ankle edema bilaterally.  No carotid bruit.  Normal pedal pulses.  Abdomen: Soft, nontender, no hepatosplenomegaly, no distention.  Skin: Intact without lesions or rashes.  Neurologic: Alert and oriented x 3.  Psych: Normal affect. Extremities: No clubbing or cyanosis.  HEENT: Normal.   Assessment/Plan: 1. Chronic systolic CHF: Probably primarily nonischemic cardiomyopathy.  She has moderate left main stenosis but no ischemia by recent Cardiolite and IVUS suggested that the left main stenosis is probably not hemodynamically significant.  It is possible that the cardiomyopathy is due to long-standing LBBB.  It could also be related to long-standing HTN.  She is euvolemic on exam today.  NYHA class II symptoms (but not very active yet).  - Continue home PT, would like to eventually get her into cardiac rehab.   - Increase Coreg to 9.375 mg bid x 1 week, then increase to 12.5 mg bid. - Keep spironolactone at 12.5 mg daily.  - Continue current Torsemide.  - Continue current  lisinopril. - Echo in 3-4 months after aggressive medication titration. If EF remains < 35%, would arrange for CRT-D placement.   2. CAD: 60% left main stenosis on 4/16 cath, probably not hemodynamically significant (based on IVUS at time of cath and Cardiolite in 3/16 with no ischemia).  No chest pain.  - Continue ASA 81, Plavix, statin.  Recent LDL was excellent at 61.  3. CKD: Creatinine 1.9 when last checked.  Has followup with Dr Lorrene Reid.  4. Renal artery stenosis: s/p left renal artery stent.  Will need followup with Dr Trula Slade.  Would expect to continue dual antiplatelet therapy for several  months then transition over to aspirin alone.  5. Blurred vision: Not having lightheaded spells so suspect this is not related to orthostasis (not positional).  It is possible that it could be related to Coreg.  We are increasing Coreg today, so if visual blurring is worse, we will know this is culprit.  If so, I will stop Coreg and eventually try to start her back on bisoprolol.  She is going to see her eye doctor.   Loralie Champagne 04/25/2015

## 2015-04-25 NOTE — Patient Outreach (Signed)
Katie Fuentes) Care Management  04/25/2015  Katie Fuentes 1934/04/13 [id number]   Notification from Katie Silversmith, RN to assign SW, [employer], LCSW assigned.  Katie Fuentes, [place] Management [place] Assistant Phone: [phone] Fax: [phone]

## 2015-04-26 ENCOUNTER — Other Ambulatory Visit: Payer: Self-pay

## 2015-04-26 DIAGNOSIS — I10 Essential (primary) hypertension: Secondary | ICD-10-CM | POA: Diagnosis not present

## 2015-04-26 DIAGNOSIS — N183 Chronic kidney disease, stage 3 (moderate): Secondary | ICD-10-CM | POA: Diagnosis not present

## 2015-04-26 DIAGNOSIS — I214 Non-ST elevation (NSTEMI) myocardial infarction: Secondary | ICD-10-CM | POA: Diagnosis not present

## 2015-04-26 DIAGNOSIS — D649 Anemia, unspecified: Secondary | ICD-10-CM | POA: Diagnosis not present

## 2015-04-26 DIAGNOSIS — E1122 Type 2 diabetes mellitus with diabetic chronic kidney disease: Secondary | ICD-10-CM | POA: Diagnosis not present

## 2015-04-26 DIAGNOSIS — I5021 Acute systolic (congestive) heart failure: Secondary | ICD-10-CM | POA: Diagnosis not present

## 2015-04-26 NOTE — Patient Outreach (Signed)
[facility]) Care Management  04/26/2015  Lamyra Malcolm Carrillo 1934/10/15 [id number]  Assessment: Member is agreeable to [facility] material. Call to obtain email address. No answer. HIPPA compliant message left.  Plan: telephonic transition of care call next week.  Thea Silversmith, RN, MSN, [place] Coordinator Cell: [phone]

## 2015-04-29 DIAGNOSIS — I509 Heart failure, unspecified: Secondary | ICD-10-CM | POA: Diagnosis not present

## 2015-04-29 DIAGNOSIS — E785 Hyperlipidemia, unspecified: Secondary | ICD-10-CM | POA: Diagnosis not present

## 2015-04-29 DIAGNOSIS — N289 Disorder of kidney and ureter, unspecified: Secondary | ICD-10-CM | POA: Diagnosis not present

## 2015-04-29 DIAGNOSIS — E1165 Type 2 diabetes mellitus with hyperglycemia: Secondary | ICD-10-CM | POA: Diagnosis not present

## 2015-04-30 DIAGNOSIS — D649 Anemia, unspecified: Secondary | ICD-10-CM | POA: Diagnosis not present

## 2015-04-30 DIAGNOSIS — I129 Hypertensive chronic kidney disease with stage 1 through stage 4 chronic kidney disease, or unspecified chronic kidney disease: Secondary | ICD-10-CM | POA: Diagnosis not present

## 2015-04-30 DIAGNOSIS — N184 Chronic kidney disease, stage 4 (severe): Secondary | ICD-10-CM | POA: Diagnosis not present

## 2015-04-30 DIAGNOSIS — E1129 Type 2 diabetes mellitus with other diabetic kidney complication: Secondary | ICD-10-CM | POA: Diagnosis not present

## 2015-05-01 DIAGNOSIS — N183 Chronic kidney disease, stage 3 (moderate): Secondary | ICD-10-CM | POA: Diagnosis not present

## 2015-05-01 DIAGNOSIS — I214 Non-ST elevation (NSTEMI) myocardial infarction: Secondary | ICD-10-CM | POA: Diagnosis not present

## 2015-05-01 DIAGNOSIS — I5021 Acute systolic (congestive) heart failure: Secondary | ICD-10-CM | POA: Diagnosis not present

## 2015-05-01 DIAGNOSIS — I10 Essential (primary) hypertension: Secondary | ICD-10-CM | POA: Diagnosis not present

## 2015-05-01 DIAGNOSIS — E1122 Type 2 diabetes mellitus with diabetic chronic kidney disease: Secondary | ICD-10-CM | POA: Diagnosis not present

## 2015-05-01 DIAGNOSIS — D649 Anemia, unspecified: Secondary | ICD-10-CM | POA: Diagnosis not present

## 2015-05-03 ENCOUNTER — Other Ambulatory Visit: Payer: Self-pay

## 2015-05-03 DIAGNOSIS — N183 Chronic kidney disease, stage 3 (moderate): Secondary | ICD-10-CM | POA: Diagnosis not present

## 2015-05-03 DIAGNOSIS — D649 Anemia, unspecified: Secondary | ICD-10-CM | POA: Diagnosis not present

## 2015-05-03 DIAGNOSIS — I5021 Acute systolic (congestive) heart failure: Secondary | ICD-10-CM | POA: Diagnosis not present

## 2015-05-03 DIAGNOSIS — I214 Non-ST elevation (NSTEMI) myocardial infarction: Secondary | ICD-10-CM | POA: Diagnosis not present

## 2015-05-03 DIAGNOSIS — E1122 Type 2 diabetes mellitus with diabetic chronic kidney disease: Secondary | ICD-10-CM | POA: Diagnosis not present

## 2015-05-03 DIAGNOSIS — I10 Essential (primary) hypertension: Secondary | ICD-10-CM | POA: Diagnosis not present

## 2015-05-03 NOTE — Patient Outreach (Signed)
[facility]) Care Management  05/03/2015  Soffia Doshier Buxbaum 1934-07-04 [id number]   Call for transition of care. No answer. HIPPA compliant message left.  Thea Silversmith, RN, MSN, [place] Coordinator Cell: [phone]

## 2015-05-07 DIAGNOSIS — N183 Chronic kidney disease, stage 3 (moderate): Secondary | ICD-10-CM | POA: Diagnosis not present

## 2015-05-07 DIAGNOSIS — I214 Non-ST elevation (NSTEMI) myocardial infarction: Secondary | ICD-10-CM | POA: Diagnosis not present

## 2015-05-07 DIAGNOSIS — D649 Anemia, unspecified: Secondary | ICD-10-CM | POA: Diagnosis not present

## 2015-05-07 DIAGNOSIS — I5021 Acute systolic (congestive) heart failure: Secondary | ICD-10-CM | POA: Diagnosis not present

## 2015-05-07 DIAGNOSIS — E1122 Type 2 diabetes mellitus with diabetic chronic kidney disease: Secondary | ICD-10-CM | POA: Diagnosis not present

## 2015-05-07 DIAGNOSIS — I10 Essential (primary) hypertension: Secondary | ICD-10-CM | POA: Diagnosis not present

## 2015-05-08 ENCOUNTER — Ambulatory Visit: Payer: Medicare Other | Admitting: *Deleted

## 2015-05-08 ENCOUNTER — Ambulatory Visit (HOSPITAL_COMMUNITY)
Admission: RE | Admit: 2015-05-08 | Discharge: 2015-05-08 | Disposition: A | Discharge status: Home / Self Care | Payer: Medicare Other | Source: Ambulatory Visit | Attending: Internal Medicine | Admitting: Internal Medicine

## 2015-05-08 ENCOUNTER — Other Ambulatory Visit: Payer: Medicare Other | Admitting: *Deleted

## 2015-05-08 ENCOUNTER — Other Ambulatory Visit: Payer: Self-pay

## 2015-05-08 DIAGNOSIS — I5041 Acute combined systolic (congestive) and diastolic (congestive) heart failure: Secondary | ICD-10-CM | POA: Insufficient documentation

## 2015-05-08 DIAGNOSIS — I5021 Acute systolic (congestive) heart failure: Secondary | ICD-10-CM | POA: Diagnosis not present

## 2015-05-08 DIAGNOSIS — N183 Chronic kidney disease, stage 3 (moderate): Secondary | ICD-10-CM | POA: Diagnosis not present

## 2015-05-08 DIAGNOSIS — I5022 Chronic systolic (congestive) heart failure: Secondary | ICD-10-CM

## 2015-05-08 DIAGNOSIS — I10 Essential (primary) hypertension: Secondary | ICD-10-CM | POA: Diagnosis not present

## 2015-05-08 DIAGNOSIS — E1122 Type 2 diabetes mellitus with diabetic chronic kidney disease: Secondary | ICD-10-CM | POA: Diagnosis not present

## 2015-05-08 DIAGNOSIS — I214 Non-ST elevation (NSTEMI) myocardial infarction: Secondary | ICD-10-CM | POA: Diagnosis not present

## 2015-05-08 DIAGNOSIS — D649 Anemia, unspecified: Secondary | ICD-10-CM | POA: Diagnosis not present

## 2015-05-08 LAB — BASIC METABOLIC PANEL
Anion gap: 8 (ref 5–15)
BUN: 71 mg/dL — AB (ref 6–20)
CO2: 24 mmol/L (ref 22–32)
Calcium: 9 mg/dL (ref 8.9–10.3)
Chloride: 105 mmol/L (ref 101–111)
Creatinine, Ser: 1.92 mg/dL — ABNORMAL HIGH (ref 0.44–1.00)
GFR calc Af Amer: 27 mL/min — ABNORMAL LOW (ref >60)
GFR, EST NON AFRICAN AMERICAN: 24 mL/min — AB (ref >60)
Glucose, Bld: 180 mg/dL — ABNORMAL HIGH (ref 65–99)
POTASSIUM: 4.7 mmol/L (ref 3.5–5.1)
Sodium: 137 mmol/L (ref 135–145)

## 2015-05-08 NOTE — Patient Outreach (Signed)
[facility]) Care Management  05/08/2015  Katie Fuentes Apr 07, 1934 [id number]  Call for Transition of care. No answer. HIPPA compliant message left.  Plan-follow up call if no return call by the end of the week.   Thea Silversmith, RN, MSN, [place] Coordinator Cell: [phone]

## 2015-05-09 ENCOUNTER — Telehealth: Payer: Self-pay | Admitting: Physician Assistant

## 2015-05-09 DIAGNOSIS — I5021 Acute systolic (congestive) heart failure: Secondary | ICD-10-CM | POA: Diagnosis not present

## 2015-05-09 DIAGNOSIS — D649 Anemia, unspecified: Secondary | ICD-10-CM | POA: Diagnosis not present

## 2015-05-09 DIAGNOSIS — N183 Chronic kidney disease, stage 3 (moderate): Secondary | ICD-10-CM | POA: Diagnosis not present

## 2015-05-09 DIAGNOSIS — I10 Essential (primary) hypertension: Secondary | ICD-10-CM | POA: Diagnosis not present

## 2015-05-09 DIAGNOSIS — E1122 Type 2 diabetes mellitus with diabetic chronic kidney disease: Secondary | ICD-10-CM | POA: Diagnosis not present

## 2015-05-09 DIAGNOSIS — I214 Non-ST elevation (NSTEMI) myocardial infarction: Secondary | ICD-10-CM | POA: Diagnosis not present

## 2015-05-09 NOTE — Telephone Encounter (Signed)
Patient called because of an episode that happened this afternoon. She had been in her usual state of health today, with no changes in her weight, no chest pain, and no shortness of breath. She worked with physical therapy and did well.   This p.m., she had sudden onset of shortness of breath. She thought she was wheezing. She has had symptoms similar to this with allergies in the past but was concerned that it was her heart failure so she called.  Discussed the situation with the patient. She does not sound area short of breath over the phone and she states she feels pretty good right now. She is compliant with her medications and has had no edema and no weight changes. Advised her that if she was concerned about her condition and wanted to be checked, and she should come to the emergency room. Advised her that if she felt okay now, and wanted to wait until tomorrow and call back if she was concerned that would also be okay.  She decided to wait and call 911 tonight if her symptoms got worse, or weight call the office tomorrow if she still needed some help.  Lenoard Aden 05/09/2015 6:39 PM Beeper [phone]

## 2015-05-10 ENCOUNTER — Other Ambulatory Visit: Payer: Self-pay

## 2015-05-10 NOTE — Patient Outreach (Signed)
[facility]) Care Management  05/10/2015  Katie Fuentes 18-Mar-1934 [id number]  Transition of care: Return call to member. No answer. HIPPA compliant message left.  Plan: Call next week  Thea Silversmith, RN, MSN, [place] Coordinator Cell: [phone]

## 2015-05-10 NOTE — Patient Outreach (Addendum)
[facility] [facility]) Care Management  05/10/15 Late entry for 05/09/15 [place] 06-16-1934 [id number]  Received voice message from member stating, "I am fine. feel good, I have been doing my therapy from physical therapy".  Plan: call to complete transition of care call.  Thea Silversmith, RN, MSN, [place] Coordinator Cell: [phone]

## 2015-05-10 NOTE — Patient Outreach (Signed)
[facility]) Care Management  05/10/2015  Katie Fuentes [birth year] [id number]  CSW received a new referral on patient from patient's RNCM with [place] Management, Thea Silversmith, reporting that patient is interested in completing her Advanced Directives ([place] documents). CSW made an initial attempt to try and contact patient today to perform phone assessment, as well as assess and assist with social work needs and services, without success.  A HIPAA compliant message was left for patient on voicemail.  CSW is currently awaiting a return call.  Nat Christen, BSW, MSW, [place] Management [place], [place], [place] [address] [name] [phone]

## 2015-05-13 ENCOUNTER — Other Ambulatory Visit: Payer: Self-pay

## 2015-05-13 DIAGNOSIS — N183 Chronic kidney disease, stage 3 (moderate): Secondary | ICD-10-CM | POA: Diagnosis not present

## 2015-05-13 DIAGNOSIS — I10 Essential (primary) hypertension: Secondary | ICD-10-CM | POA: Diagnosis not present

## 2015-05-13 DIAGNOSIS — E1122 Type 2 diabetes mellitus with diabetic chronic kidney disease: Secondary | ICD-10-CM | POA: Diagnosis not present

## 2015-05-13 DIAGNOSIS — D649 Anemia, unspecified: Secondary | ICD-10-CM | POA: Diagnosis not present

## 2015-05-13 DIAGNOSIS — I5021 Acute systolic (congestive) heart failure: Secondary | ICD-10-CM | POA: Diagnosis not present

## 2015-05-13 DIAGNOSIS — I509 Heart failure, unspecified: Secondary | ICD-10-CM

## 2015-05-13 DIAGNOSIS — I214 Non-ST elevation (NSTEMI) myocardial infarction: Secondary | ICD-10-CM | POA: Diagnosis not present

## 2015-05-13 NOTE — Patient Outreach (Signed)
[facility]) Care Management  05/13/2015  Nigel Wessman Worrell Jan 29, 1934 [id number]   Call [place]- regarding telemonitoring program. HIPPA compliant message left.  Plan: follow up call within 1-2 days if no return call.  Thea Silversmith, RN, MSN, [place] Coordinator Cell: [phone]

## 2015-05-13 NOTE — Patient Outreach (Signed)
[facility]) Care Management  05/13/2015  Katie Fuentes 09/21/1934 [id number]   Recent admission for heart failure followed by admission for myocardial infarction:Transition of care call-Spoke with member who reports she is doing well. Denies any problems. No chest pain. Member acknowledges that she has sublingual nitroglycerin, but has not needed to use this. Member states she was discharged from home health physical therapy today and the home health nurse is due to see her tomorrow.  EMMI videos-member states she did not received the e-mail, stating that she is transitioning to a new e-mail address. New e-mail address obtained. Member discussed desire for more education regarding salt and potassium in diet. Care manger request member review EMMI videos. Also informed of [facility] as a resource and informed member to discuss with her primary care.  Heart failure-Member continues to weigh self daily on telemonitoring scale provided by her insurance company. Member however states she is not sure if her measurements are being received on the other end. Member states she has not received any communication,  but states that her weights have been good and she has not had a weight increase that would need a call to her doctor. Care manager reviewed signs/symptoms of heart failure exacerbation and when to call the doctor.   History of asthma-member reports history of asthma which is new for her. Member states one day last week, she thought she was having a little flare of her asthma which resolved on it's own. Care manager encouraged member to follow up with her primary care to discuss. Also reinforced the 24 hour nurse advice line number availability.  Transition of care program completed. Member is agreeable to transition to the health coach for continued education regarding management of heart failure/disease.  Plan: Call [facility] to follow up on Tele-monitoring  scales. Transition to [employer]. Reschedule EMMI videos  Thea Silversmith, RN, MSN, [place] Coordinator Cell: [phone]

## 2015-05-13 NOTE — Patient Outreach (Signed)
[place] [facility]) Care Management  05/13/2015  Katie Fuentes 11-07-1934 [id number]   Request from Thea Silversmith, RN for [place] assignment, [name], RN assigned.  [name]. [place], [place] Management [place] Assistant Phone: [phone] Fax: [phone]

## 2015-05-14 DIAGNOSIS — I5021 Acute systolic (congestive) heart failure: Secondary | ICD-10-CM | POA: Diagnosis not present

## 2015-05-14 DIAGNOSIS — E1122 Type 2 diabetes mellitus with diabetic chronic kidney disease: Secondary | ICD-10-CM | POA: Diagnosis not present

## 2015-05-14 DIAGNOSIS — I214 Non-ST elevation (NSTEMI) myocardial infarction: Secondary | ICD-10-CM | POA: Diagnosis not present

## 2015-05-14 DIAGNOSIS — I10 Essential (primary) hypertension: Secondary | ICD-10-CM | POA: Diagnosis not present

## 2015-05-14 DIAGNOSIS — N183 Chronic kidney disease, stage 3 (moderate): Secondary | ICD-10-CM | POA: Diagnosis not present

## 2015-05-14 DIAGNOSIS — D649 Anemia, unspecified: Secondary | ICD-10-CM | POA: Diagnosis not present

## 2015-05-14 NOTE — Patient Outreach (Signed)
[facility]) Care Management  05/14/2015  Marra Fraga [place] 1934/05/31 [id number]  Case Manager Assistant, L. Moore called and spoke with Elaine) who confirmed that they have been receiving member's weights and someone from [facility] will call to follow up with member. Care manager called and informed member that her weights are being recorded by [facility].  Plan:  Transition to Health coach. Send update to primary care provider  Thea Silversmith, RN, MSN, [place] Coordinator Cell: [phone]

## 2015-05-15 DIAGNOSIS — N183 Chronic kidney disease, stage 3 (moderate): Secondary | ICD-10-CM | POA: Diagnosis not present

## 2015-05-15 DIAGNOSIS — E1122 Type 2 diabetes mellitus with diabetic chronic kidney disease: Secondary | ICD-10-CM | POA: Diagnosis not present

## 2015-05-15 DIAGNOSIS — I5021 Acute systolic (congestive) heart failure: Secondary | ICD-10-CM | POA: Diagnosis not present

## 2015-05-15 DIAGNOSIS — D649 Anemia, unspecified: Secondary | ICD-10-CM | POA: Diagnosis not present

## 2015-05-15 DIAGNOSIS — I10 Essential (primary) hypertension: Secondary | ICD-10-CM | POA: Diagnosis not present

## 2015-05-15 DIAGNOSIS — I214 Non-ST elevation (NSTEMI) myocardial infarction: Secondary | ICD-10-CM | POA: Diagnosis not present

## 2015-05-16 ENCOUNTER — Other Ambulatory Visit: Payer: Self-pay

## 2015-05-16 NOTE — Patient Outreach (Signed)
[facility]) Care Management  05/16/2015  Blakeleigh Domek Koon 03/28/1934 [id number]   Telephone call to patient to introduce myself and set up an appointment for monthly telephonic disease management contacts.  Patient agreed to [employer] services. Appointment made for 06-11-15.  [name], RN, MSN [place] [phone] Fax [phone]

## 2015-05-21 ENCOUNTER — Other Ambulatory Visit: Payer: Self-pay | Admitting: *Deleted

## 2015-05-21 DIAGNOSIS — E780 Pure hypercholesterolemia: Secondary | ICD-10-CM | POA: Diagnosis not present

## 2015-05-21 DIAGNOSIS — I10 Essential (primary) hypertension: Secondary | ICD-10-CM | POA: Diagnosis not present

## 2015-05-21 DIAGNOSIS — E1165 Type 2 diabetes mellitus with hyperglycemia: Secondary | ICD-10-CM | POA: Diagnosis not present

## 2015-05-21 NOTE — Patient Outreach (Signed)
[place] [facility]) Care Management  05/21/2015  Katie Fuentes Apr 02, 1934 [id number]   CSW made a second attempt at trying to contact patient today, at the following contact number:  [phone], receiving this message, "Sorry, the mailbox is full and unable to accept new messages".  "To leave a message with another subscriber, please enter the subscribers phone number at this time".  CSW was unable to leave a message for patient.  CSW then tried contacting patient on the other contact number listed for patient:  [phone], but patient was unavailable at the time of CSW's call.  CSW was able to leave a HIPAA compliant message for patient on voicemail, briefly explaining the reason for the call.  CSW also indicated that CSW would like to perform the initial phone assessment, as well as assess and assist with social work needs and services.  CSW is currently awaiting a return call.  Katie Fuentes, BSW, MSW, [place] Management [place], [place], [place] [address] [name] [phone]

## 2015-05-23 ENCOUNTER — Other Ambulatory Visit: Payer: Self-pay | Admitting: *Deleted

## 2015-05-23 ENCOUNTER — Encounter: Payer: Self-pay | Admitting: *Deleted

## 2015-05-23 NOTE — Patient Outreach (Signed)
[place] [facility]) Care Management  05/23/2015  Katie Fuentes 1934-07-11 [id number]   CSW made a third and final attempt to try and contact patient today to perform phone assessment, as well as assess and assist with social work needs and services, without success.  A HIPAA compliant message was left on voicemail, explaining that CSW would be closing patient's case if a return call is not received.  In CSW's message, CSW explained that CSW continues to try and contact patient to schedule a home visit to assist patient with completion of requested social work services and referrals to various community agencies and resources.  CSW encouraged patient to return CSW's call at her earliest convenience if patient is interested in receiving social work services through [place] with [facility]. CSW will mail an outreach letter to patient's home today, allowing patient 10 business days to return CSW's call if patient wishes to receive services. Otherwise, CSW will perform a case closure on patient, due to inability to establish phone contact with patient, despite three previous attempts.  Katie Fuentes, BSW, MSW, [place] Management [place], [place], [place] [address] [name] [phone]

## 2015-05-29 ENCOUNTER — Ambulatory Visit (HOSPITAL_COMMUNITY)
Admission: RE | Admit: 2015-05-29 | Discharge: 2015-05-29 | Disposition: A | Discharge status: Home / Self Care | Payer: Medicare Other | Source: Ambulatory Visit | Attending: Cardiology | Admitting: Cardiology

## 2015-05-29 VITALS — BP 136/60 | HR 86 | Wt 151.8 lb

## 2015-05-29 DIAGNOSIS — N183 Chronic kidney disease, stage 3 unspecified: Secondary | ICD-10-CM

## 2015-05-29 DIAGNOSIS — I429 Cardiomyopathy, unspecified: Secondary | ICD-10-CM | POA: Diagnosis not present

## 2015-05-29 DIAGNOSIS — I2510 Atherosclerotic heart disease of native coronary artery without angina pectoris: Secondary | ICD-10-CM | POA: Insufficient documentation

## 2015-05-29 DIAGNOSIS — I701 Atherosclerosis of renal artery: Secondary | ICD-10-CM | POA: Insufficient documentation

## 2015-05-29 DIAGNOSIS — N189 Chronic kidney disease, unspecified: Secondary | ICD-10-CM | POA: Diagnosis not present

## 2015-05-29 DIAGNOSIS — I447 Left bundle-branch block, unspecified: Secondary | ICD-10-CM | POA: Diagnosis not present

## 2015-05-29 DIAGNOSIS — E785 Hyperlipidemia, unspecified: Secondary | ICD-10-CM | POA: Insufficient documentation

## 2015-05-29 DIAGNOSIS — E1122 Type 2 diabetes mellitus with diabetic chronic kidney disease: Secondary | ICD-10-CM | POA: Diagnosis not present

## 2015-05-29 DIAGNOSIS — Z7982 Long term (current) use of aspirin: Secondary | ICD-10-CM | POA: Insufficient documentation

## 2015-05-29 DIAGNOSIS — I129 Hypertensive chronic kidney disease with stage 1 through stage 4 chronic kidney disease, or unspecified chronic kidney disease: Secondary | ICD-10-CM | POA: Insufficient documentation

## 2015-05-29 DIAGNOSIS — Z87891 Personal history of nicotine dependence: Secondary | ICD-10-CM | POA: Diagnosis not present

## 2015-05-29 DIAGNOSIS — I5022 Chronic systolic (congestive) heart failure: Secondary | ICD-10-CM | POA: Diagnosis not present

## 2015-05-29 DIAGNOSIS — Z79899 Other long term (current) drug therapy: Secondary | ICD-10-CM | POA: Insufficient documentation

## 2015-05-29 DIAGNOSIS — I340 Nonrheumatic mitral (valve) insufficiency: Secondary | ICD-10-CM | POA: Insufficient documentation

## 2015-05-29 MED ORDER — LISINOPRIL 5 MG PO TABS: ORAL_TABLET | ORAL | Status: AC

## 2015-05-29 NOTE — Patient Instructions (Signed)
Increase Lisinopril to 5 mg (1 tab) in AM and 2.5 mg (1/2 tab) in PM  Labs in about 10 days  Your physician has requested that you have an echocardiogram. Echocardiography is a painless test that uses sound waves to create images of your heart. It provides your doctor with information about the size and shape of your heart and how well your heart's chambers and valves are working. This procedure takes approximately one hour. There are no restrictions for this procedure.  IN September  Your physician recommends that you schedule a follow-up appointment in: 1 month

## 2015-05-29 NOTE — Progress Notes (Signed)
Patient ID: Katie Fuentes, female   DOB: Aug 28, 1934, 81 y.o.   MRN: [id number] PCP: Dr. Minna Antis  81 yo with history of CAD, renal artery stenosis, CKD, and primarily nonischemic cardiomyopathy presents for cardiology followup.  Patient initially went to the ER in 3/16 with dyspnea, troponin of 1.18, creatinine 1.87.  Echo showed EF 30-35% with diffuse hypokinesis and moderate-severe presumed functional MR.  Cardiolite showed EF 22% with no ischemia.  She was diuresed and discharged home.  She was re-admitted in 4/16 with recurrent CHF.  RHC/LHC showed preserved cardiac output and a 60% left main stenosis that was likely not hemodynamically significant by IVUS.  She developed AKI on CKD after cardiac cath.  She also had worsening volume overload.  Also noted by cath was 95% left renal artery stenosis that was treated with PCI by Dr Trula Slade.  After renal artery stent, she developed anemia and had 2 units PRBCs transfused.  After a long hospital stay, she was discharged home.    She returns for followup.  No dyspnea walking on flat ground.  Does ok walking up a flight of steps.  No orthopnea/PND.  No chest pain. No lightheadedness or syncope.   Mild visual blurring at times, not sure what triggers this.  Plans to see her eye doctor.  She is riding her stationary bike for exercise.   Labs (5/16): K 4.6 => 5.2 => 4.8, creatinine 1.5 => 1.9, LDL 61, HDL 36   Labs (6/16): K 4.7, creatinine 1.92  ECG (5/16): NSR, LBBB, QRS 154 msec  PMH: 1. HTN 2. Type II diabetes 3. Hyperlipidemia 4. Renal artery stenosis: 95% on left, s/p left renal artery stent 5/16 ([facility]). 5. LBBB 6. CKD 7. CAD: Cardiolite (3/16) with no ischemia, EF 22%.  LHC (4/16) with 60% left main stenosis.  IVUS suggested that this was not hemodynamically significant (6.1 mm^2).   8. Chronic systolic CHF: Suspect primarily nonischemic.  Echo (3/16) with EF 30-35%, global hypokinesis, moderate-severe functional MR.  RHC (4/16) with mean RA  2, PA 31/11, mean PCWP 12, CI 2.8.   9. Mitral regurgitation: Moderate-severe presumed functional MR on echo in 3/16.   SH: Married, lives in [place], remote tobacco, retired.    FH: Brother with CABG.   ROS: All systems reviewed and negative except as per HPI.   Current Outpatient Prescriptions  Medication Sig Dispense Refill  . ACCU-CHEK AVIVA PLUS test strip     . Ascorbic Acid (VITAMIN C) 1000 MG tablet Take 1,000 mg by mouth daily.    Marland Kitchen aspirin 81 MG tablet Take 81 mg by mouth at bedtime.     Marland Kitchen atorvastatin (LIPITOR) 10 MG tablet Take 10 mg by mouth daily.    . beta carotene 10000 UNIT capsule Take 10,000 Units by mouth daily.    . calcium citrate (CALCITRATE - DOSED IN MG ELEMENTAL CALCIUM) 950 MG tablet Take 1 tablet by mouth 2 (two) times daily.     . carvedilol (COREG) 12.5 MG tablet Take 1 tablet (12.5 mg total) by mouth 2 (two) times daily with a meal. 60 tablet 6  . cholecalciferol (VITAMIN D) 1000 UNITS tablet Take 1,000 Units by mouth daily.    . clopidogrel (PLAVIX) 75 MG tablet Take 1 tablet (75 mg total) by mouth daily. 30 tablet 6  . docusate sodium (COLACE) 100 MG capsule Take 100 mg by mouth daily.    Marland Kitchen glucosamine-chondroitin 500-400 MG tablet Take 1 tablet by mouth daily.     Marland Kitchen  glyBURIDE (DIABETA) 5 MG tablet Take 5 mg by mouth daily with breakfast.    . iron polysaccharides (NIFEREX) 150 MG capsule Take 1 capsule (150 mg total) by mouth 2 (two) times daily. 60 capsule 2  . isosorbide mononitrate (IMDUR) 30 MG 24 hr tablet Take 1 tablet (30 mg total) by mouth daily. 90 tablet 3  . L-Methylfolate-B6-B12 (METANX PO) Take 1 tablet by mouth daily.     . Lancets (ACCU-CHEK MULTICLIX) lancets     . lisinopril (PRINIVIL,ZESTRIL) 5 MG tablet Take 1 tab in AM and 1/2 tab in PM 45 tablet 6  . nitroGLYCERIN (NITROSTAT) 0.4 MG SL tablet Place 1 tablet (0.4 mg total) under the tongue every 5 (five) minutes x 3 doses as needed for chest pain. 25 tablet 3  . Probiotic Product  (PROBIOTIC DAILY PO) Take 1 tablet by mouth at bedtime. Florastor    . sitaGLIPtin (JANUVIA) 100 MG tablet Take 100 mg by mouth daily.    Marland Kitchen spironolactone (ALDACTONE) 25 MG tablet Take 12.5 mg by mouth daily.    Marland Kitchen torsemide (DEMADEX) 20 MG tablet Take 20 mg by mouth every other day.    . Travoprost, BAK Free, (TRAVATAN) 0.004 % SOLN ophthalmic solution Place 1 drop into both eyes at bedtime.    . vitamin E 400 UNIT capsule Take 400 Units by mouth daily.     No current facility-administered medications for this encounter.   BP 136/60 mmHg  Pulse 86  Wt 151 lb 12 oz (68.833 kg)  SpO2 99%  LMP 07/24/1977 General: NAD Neck: No JVD, no thyromegaly or thyroid nodule.  Lungs: Clear to auscultation bilaterally with normal respiratory effort. CV: Nondisplaced PMI.  Heart regular S1/S2, no S3/S4, no murmur.  No edema.  No carotid bruit.  Normal pedal pulses.  Abdomen: Soft, nontender, no hepatosplenomegaly, no distention.  Skin: Intact without lesions or rashes.  Neurologic: Alert and oriented x 3.  Psych: Normal affect. Extremities: No clubbing or cyanosis.  HEENT: Normal.   Assessment/Plan: 1. Chronic systolic CHF: Probably primarily nonischemic cardiomyopathy.  She has moderate left main stenosis but no ischemia by recent Cardiolite and IVUS suggested that the left main stenosis is probably not hemodynamically significant.  It is possible that the cardiomyopathy is due to long-standing LBBB.  It could also be related to long-standing HTN.  She is euvolemic on exam today.  NYHA class II symptoms (but not very active yet).  - Thinking about cardiac rehab but would have to pay a lot out of pocket. - Continue Coreg 12.5 mg bid and spironolactone 12.5 mg daily. - Increase lisinopril to 5 qam, 2.5 qpm. BMET/BNP in 10 days.  - Continue current Torsemide.  - Echo in 9/16 after aggressive medication titration. If EF remains < 35%, would arrange for CRT-D placement.   2. CAD: 60% left main stenosis on  4/16 cath, probably not hemodynamically significant (based on IVUS at time of cath and Cardiolite in 3/16 with no ischemia).  No chest pain.  - Continue ASA 81, Plavix, statin.  Recent LDL was excellent at 61.  3. CKD: Creatinine 1.9 when last checked.  Has followup with Dr Lorrene Reid. BMET after increase in lisinopril. 4. Renal artery stenosis: s/p left renal artery stent.  Will need followup with Dr Trula Slade.  Would expect to continue dual antiplatelet therapy for several months then transition over to aspirin alone.   Loralie Champagne 05/29/2015

## 2015-05-30 ENCOUNTER — Encounter (HOSPITAL_COMMUNITY)
Admission: RE | Admit: 2015-05-30 | Discharge: 2015-05-30 | Disposition: A | Discharge status: Home / Self Care | Payer: Medicare Other | Source: Ambulatory Visit | Attending: Cardiology | Admitting: Cardiology

## 2015-05-30 DIAGNOSIS — I509 Heart failure, unspecified: Secondary | ICD-10-CM | POA: Insufficient documentation

## 2015-05-30 NOTE — Progress Notes (Signed)
Cardiac Rehab Medication Review by a Pharmacist  Does the patient  feel that his/her medications are working for him/her?  yes  Has the patient been experiencing any side effects to the medications prescribed?  no  Does the patient measure his/her own blood pressure or blood glucose at home?  yes   Does the patient have any problems obtaining medications due to transportation or finances?   no  Understanding of regimen: excellent Understanding of indications: excellent Potential of compliance: excellent    Pharmacist comments: 81 y/o F presents to cardiac rehab.  Pt endorses an understanding of her medication regimen and reports compliance. Pt has a long list of herbal medications that she "has taken for years" however is not currently taking due to recent changes in her medication regimen.  She does plan to restart in the near future.     Myrene Galas, PharmD Pharmacy Resident 05/30/2015 9:00 AM

## 2015-06-03 ENCOUNTER — Ambulatory Visit: Payer: Medicare Other | Admitting: Nurse Practitioner

## 2015-06-03 ENCOUNTER — Encounter (HOSPITAL_COMMUNITY)
Admission: RE | Admit: 2015-06-03 | Discharge: 2015-06-03 | Disposition: A | Discharge status: Home / Self Care | Payer: Medicare Other | Source: Ambulatory Visit | Attending: Cardiology | Admitting: Cardiology

## 2015-06-03 ENCOUNTER — Encounter (HOSPITAL_COMMUNITY): Payer: Self-pay

## 2015-06-03 DIAGNOSIS — I509 Heart failure, unspecified: Secondary | ICD-10-CM | POA: Diagnosis not present

## 2015-06-03 LAB — GLUCOSE, CAPILLARY
Glucose-Capillary: 126 mg/dL — ABNORMAL HIGH (ref 65–99)
Glucose-Capillary: 99 mg/dL (ref 65–99)

## 2015-06-03 NOTE — Progress Notes (Signed)
Pt started cardiac rehab today.  Pt tolerated light exercise without difficulty. VSS, telemetry-sinus rhythm, asymptomatic.  Medication list reconciled.  Pt verbalized compliance with medications and denies barriers to compliance. PSYCHOSOCIAL ASSESSMENT:  PHQ-0. Pt exhibits positive coping skills, hopeful outlook with supportive family. No psychosocial needs identified at this time, no psychosocial interventions necessary.    Pt enjoys reading, gardening, and quilting.   Pt cardiac rehab  goal is  to increase strength and stamina.  Pt encouraged to participate in exercising on your own education class as well as adherence to individualized home exercise plan to increase ability to achieve these goals.   Pt oriented to exercise equipment and routine.  Understanding verbalized.

## 2015-06-04 ENCOUNTER — Other Ambulatory Visit: Payer: Self-pay

## 2015-06-04 NOTE — Patient Outreach (Signed)
[facility]) Care Management  [facility] Care Manager  06/04/2015   Katie Fuentes 1934-03-23 [id number]   Telephonic assessment completed this date by [place] for disease management of congestive heart failure.  Subjective: Patient reports she is doing well, with no occurrence of sudden increased weight or edema.  Started cardiac rehab yesterday, and plans to go 3 times a week.  She states her weight has been steady, around 151 pounds.  She weighs each morning and records her weight.  She states she saw her cardiologist on July 6th, and her PCP on June 28th.  She stated that her long term goal is to prevent readmissions to the hospital.   Objective: Patient was able to discuss CHF Action Plan, and verbalize 'yellow zone' symptoms which would prompt a call to her MD.  She requested written educational materials on a low sodium diet.    Current Medications:  Current Outpatient Prescriptions  Medication Sig Dispense Refill  . ACCU-CHEK AVIVA PLUS test strip     . acetaminophen (TYLENOL) 500 MG tablet Take 500 mg by mouth every 6 (six) hours as needed for mild pain (Using for root canal).    . Ascorbic Acid (VITAMIN C) 1000 MG tablet Take 1,000 mg by mouth daily.    Marland Kitchen aspirin 81 MG tablet Take 81 mg by mouth at bedtime.     Marland Kitchen atorvastatin (LIPITOR) 10 MG tablet Take 10 mg by mouth every evening.     . beta carotene 10000 UNIT capsule Take 10,000 Units by mouth daily.    . calcium citrate (CALCITRATE - DOSED IN MG ELEMENTAL CALCIUM) 950 MG tablet Take 1 tablet by mouth 2 (two) times daily.     . carvedilol (COREG) 12.5 MG tablet Take 1 tablet (12.5 mg total) by mouth 2 (two) times daily with a meal. 60 tablet 6  . cholecalciferol (VITAMIN D) 1000 UNITS tablet Take 1,000 Units by mouth daily.    . clopidogrel (PLAVIX) 75 MG tablet Take 1 tablet (75 mg total) by mouth daily. 30 tablet 6  . docusate sodium (COLACE) 100 MG capsule Take 100 mg by mouth daily.    Marland Kitchen  glucosamine-chondroitin 500-400 MG tablet Take 1 tablet by mouth daily.     Marland Kitchen glyBURIDE (DIABETA) 5 MG tablet Take 5 mg by mouth daily with breakfast.    . iron polysaccharides (NIFEREX) 150 MG capsule Take 1 capsule (150 mg total) by mouth 2 (two) times daily. 60 capsule 2  . isosorbide mononitrate (IMDUR) 30 MG 24 hr tablet Take 1 tablet (30 mg total) by mouth daily. 90 tablet 3  . L-Methylfolate-B6-B12 (METANX PO) Take 1 tablet by mouth daily.     . Lancets (ACCU-CHEK MULTICLIX) lancets     . lisinopril (PRINIVIL,ZESTRIL) 5 MG tablet Take 1 tab in AM and 1/2 tab in PM 45 tablet 6  . nitroGLYCERIN (NITROSTAT) 0.4 MG SL tablet Place 1 tablet (0.4 mg total) under the tongue every 5 (five) minutes x 3 doses as needed for chest pain. 25 tablet 3  . Probiotic Product (PROBIOTIC DAILY PO) Take 1 tablet by mouth at bedtime. Florastor    . sitaGLIPtin (JANUVIA) 100 MG tablet Take 100 mg by mouth daily.    Marland Kitchen spironolactone (ALDACTONE) 25 MG tablet Take 12.5 mg by mouth daily.    Marland Kitchen torsemide (DEMADEX) 20 MG tablet Take 20 mg by mouth every other day.    . Travoprost, BAK Free, (TRAVATAN) 0.004 % SOLN ophthalmic solution Place  1 drop into both eyes at bedtime.    . vitamin E 400 UNIT capsule Take 400 Units by mouth daily.     No current facility-administered medications for this visit.    Functional Status:  In your present state of health, do you have any difficulty performing the following activities: 06/04/2015 04/24/2015  Hearing? Tempie Donning  Vision? N N  Difficulty concentrating or making decisions? N N  Walking or climbing stairs? Y N  Dressing or bathing? N N  Doing errands, shopping? Tempie Donning  Preparing Food and eating ? N N  Using the Toilet? N N  In the past six months, have you accidently leaked urine? N N  Do you have problems with loss of bowel control? N N  Managing your Medications? N N  Managing your Finances? N N  Housekeeping or managing your Housekeeping? Tempie Donning    Fall/Depression  Screening: PHQ 2/9 Scores 06/04/2015 06/03/2015 04/24/2015  PHQ - 2 Score 0 0 0   [facility] CM Care Plan Problem One        Patient Outreach Telephone from 06/04/2015 in [place] Problem One  at risk for readmission   Care Plan for Problem One  Active   THN CM Short Term Goal #1 (0-30 days)  Member will schedule appointment with new primary care within the next 30 days.   THN CM Short Term Goal #1 Met Date  04/24/15 [facility] provider changed on insurance card, saw new physician]   THN CM Short Term Goal #2 (0-30 days)  Member will verbalize at least three signs/symptoms of heart failure exacerbation and when to call the doctor within the next 30days.   THN CM Short Term Goal #2 Met Date  05/13/15 [weight gain over 2 pound overnight, swelling, SOB]    [facility] CM Care Plan Problem Two        Patient Outreach Telephone from 06/04/2015 in [place] Problem Two  knowledge deficit related heart failure management   Care Plan for Problem Two  Active   THN CM Short Term Goal #1 (0-30 days)  member will review at least one assigned EMMI video within the next 30 days.   THN CM Short Term Goal #1 Start Date  05/13/15   Interventions for Short Term Goal #2   -- [Will mail written materials. ]   THN CM Short Term Goal #2 (0-30 days)  -- [Patient will be able to discuss low salt diet guidelines.]   THN CM Short Term Goal #2 Start Date  06/04/15   Interventions for Short Term Goal #2  Education provided.  Will mail EMMI materials.      Assessment: Patient has started cardiac rehabilitation, and appears motivated to increase her knowledge and successfully manage her congestive heart failure.  Plan: Mail EMMI Low Sodium Diet guidelines.           Contact information provided for Health Coach and for 24 hour nurse line.  Encouraged to call as needed.           Will follow-up in one month.  [name], RN, MSN [place] [phone] Fax  [phone]

## 2015-06-05 ENCOUNTER — Encounter (HOSPITAL_COMMUNITY): Admission: RE | Admit: 2015-06-05 | Payer: Medicare Other | Source: Ambulatory Visit

## 2015-06-05 ENCOUNTER — Ambulatory Visit: Payer: Medicare Other | Admitting: *Deleted

## 2015-06-05 ENCOUNTER — Telehealth (HOSPITAL_COMMUNITY): Payer: Self-pay | Admitting: Cardiac Rehabilitation

## 2015-06-05 ENCOUNTER — Other Ambulatory Visit: Payer: Medicare Other | Admitting: *Deleted

## 2015-06-05 ENCOUNTER — Encounter: Payer: Self-pay | Admitting: *Deleted

## 2015-06-05 NOTE — Patient Outreach (Signed)
[facility] Care Management  06/05/2015  Katie Fuentes [place] Jun 30, 1934 [id number]   Notification received from [facility], LCSW to send an advance directives packet to the patient. A packet was mailed on 06/05/2015.  [name] L. Elbert Ewings [facility] Care Management Assistant [phone]

## 2015-06-05 NOTE — Telephone Encounter (Signed)
Phone message received from pt she will be absent today due to neck "kink".  Returned pt call. Pt reports she had neck discomfort yesterday somewhat better today however she is concerned about the arm activities associated with cardiac rehab. Pt reports history of neck injury as a child which causes neck strain frequently.  Pt advised to alternate heat and ice to neck also use muscle rub for relief. Pt also advised equipment changes will be made at next cardiac rehab visit to decrease arm use contributing to neck strain.  Understanding verbalized.

## 2015-06-05 NOTE — Patient Outreach (Signed)
[facility]) Care Management  06/05/2015  Alexsandria Kivett [place] Jan 17, 1934 [id number]   CSW made one last attempt to try and contact patient today to perform phone assessment, as well as assess and assist with social work needs and services, without success.  CSW has made a total of four attempts, as well as mailed an outreach letter to patient's home on June 30th, allowing patient 10 business days to contact CSW if she was interested in receiving social work services through [place] with Scientist, clinical (histocompatibility and immunogenetics).  No return call received.  CSW will proceed with case closure on patient.  CSW will no longer make arrangements to contact patient by phone or in person, as CSW has made four attempts to try and contact patient by phone, as well as mailed an outreach letter to patient's home, without success.  Case closure will be performed due to inability to establish initial phone contact with patient, despite required level of attempts.   CSW will submit a case closure request to Lurline Del, Care Management Assistant with [facility], in the form of an [employer], ensuring that Mrs. Laurance Flatten is aware of Ellouise Newer, Telephonic Nurse Case Manager with [facility], continued involvement with patient's care. CSW will fax a correspondence letter to patient's Primary Care Physician, Dr. Merrilee Seashore to report social work involvement with patient.  Nat Christen, BSW, MSW, [place] [facility] [place], [place], [place] [address] [name] [phone]

## 2015-06-07 ENCOUNTER — Other Ambulatory Visit (HOSPITAL_COMMUNITY): Payer: Medicare Other

## 2015-06-07 ENCOUNTER — Encounter (HOSPITAL_COMMUNITY): Payer: Medicare Other

## 2015-06-08 ENCOUNTER — Encounter (HOSPITAL_COMMUNITY): Payer: Self-pay | Admitting: *Deleted

## 2015-06-08 ENCOUNTER — Inpatient Hospital Stay (HOSPITAL_COMMUNITY)
Admission: EM | Admit: 2015-06-08 | Discharge: 2015-06-24 | DRG: 252 | Disposition: E | Payer: Medicare Other | Attending: Internal Medicine | Admitting: Internal Medicine

## 2015-06-08 DIAGNOSIS — M542 Cervicalgia: Secondary | ICD-10-CM | POA: Diagnosis present

## 2015-06-08 DIAGNOSIS — M549 Dorsalgia, unspecified: Secondary | ICD-10-CM | POA: Diagnosis present

## 2015-06-08 DIAGNOSIS — Z7902 Long term (current) use of antithrombotics/antiplatelets: Secondary | ICD-10-CM | POA: Diagnosis not present

## 2015-06-08 DIAGNOSIS — Z87891 Personal history of nicotine dependence: Secondary | ICD-10-CM | POA: Diagnosis not present

## 2015-06-08 DIAGNOSIS — I4892 Unspecified atrial flutter: Secondary | ICD-10-CM | POA: Diagnosis not present

## 2015-06-08 DIAGNOSIS — E1165 Type 2 diabetes mellitus with hyperglycemia: Secondary | ICD-10-CM | POA: Diagnosis not present

## 2015-06-08 DIAGNOSIS — Z794 Long term (current) use of insulin: Secondary | ICD-10-CM | POA: Diagnosis not present

## 2015-06-08 DIAGNOSIS — E1129 Type 2 diabetes mellitus with other diabetic kidney complication: Secondary | ICD-10-CM | POA: Diagnosis present

## 2015-06-08 DIAGNOSIS — Z9289 Personal history of other medical treatment: Secondary | ICD-10-CM

## 2015-06-08 DIAGNOSIS — K7200 Acute and subacute hepatic failure without coma: Secondary | ICD-10-CM | POA: Diagnosis not present

## 2015-06-08 DIAGNOSIS — D649 Anemia, unspecified: Secondary | ICD-10-CM | POA: Diagnosis present

## 2015-06-08 DIAGNOSIS — E785 Hyperlipidemia, unspecified: Secondary | ICD-10-CM | POA: Diagnosis present

## 2015-06-08 DIAGNOSIS — N184 Chronic kidney disease, stage 4 (severe): Secondary | ICD-10-CM | POA: Diagnosis not present

## 2015-06-08 DIAGNOSIS — R570 Cardiogenic shock: Secondary | ICD-10-CM | POA: Diagnosis not present

## 2015-06-08 DIAGNOSIS — I5041 Acute combined systolic (congestive) and diastolic (congestive) heart failure: Secondary | ICD-10-CM | POA: Diagnosis present

## 2015-06-08 DIAGNOSIS — Z66 Do not resuscitate: Secondary | ICD-10-CM

## 2015-06-08 DIAGNOSIS — Z452 Encounter for adjustment and management of vascular access device: Secondary | ICD-10-CM

## 2015-06-08 DIAGNOSIS — E860 Dehydration: Secondary | ICD-10-CM | POA: Diagnosis not present

## 2015-06-08 DIAGNOSIS — I340 Nonrheumatic mitral (valve) insufficiency: Secondary | ICD-10-CM | POA: Diagnosis present

## 2015-06-08 DIAGNOSIS — J9601 Acute respiratory failure with hypoxia: Secondary | ICD-10-CM | POA: Diagnosis not present

## 2015-06-08 DIAGNOSIS — G8929 Other chronic pain: Secondary | ICD-10-CM | POA: Diagnosis present

## 2015-06-08 DIAGNOSIS — J90 Pleural effusion, not elsewhere classified: Secondary | ICD-10-CM | POA: Diagnosis not present

## 2015-06-08 DIAGNOSIS — I509 Heart failure, unspecified: Secondary | ICD-10-CM | POA: Diagnosis not present

## 2015-06-08 DIAGNOSIS — N189 Chronic kidney disease, unspecified: Secondary | ICD-10-CM | POA: Diagnosis not present

## 2015-06-08 DIAGNOSIS — I10 Essential (primary) hypertension: Secondary | ICD-10-CM | POA: Diagnosis present

## 2015-06-08 DIAGNOSIS — J9602 Acute respiratory failure with hypercapnia: Secondary | ICD-10-CM | POA: Diagnosis not present

## 2015-06-08 DIAGNOSIS — R55 Syncope and collapse: Secondary | ICD-10-CM | POA: Diagnosis not present

## 2015-06-08 DIAGNOSIS — I480 Paroxysmal atrial fibrillation: Secondary | ICD-10-CM | POA: Diagnosis not present

## 2015-06-08 DIAGNOSIS — N179 Acute kidney failure, unspecified: Secondary | ICD-10-CM | POA: Diagnosis present

## 2015-06-08 DIAGNOSIS — Z4659 Encounter for fitting and adjustment of other gastrointestinal appliance and device: Secondary | ICD-10-CM

## 2015-06-08 DIAGNOSIS — K047 Periapical abscess without sinus: Secondary | ICD-10-CM | POA: Diagnosis present

## 2015-06-08 DIAGNOSIS — R680 Hypothermia, not associated with low environmental temperature: Secondary | ICD-10-CM | POA: Diagnosis not present

## 2015-06-08 DIAGNOSIS — I517 Cardiomegaly: Secondary | ICD-10-CM | POA: Diagnosis not present

## 2015-06-08 DIAGNOSIS — D631 Anemia in chronic kidney disease: Secondary | ICD-10-CM | POA: Diagnosis present

## 2015-06-08 DIAGNOSIS — R404 Transient alteration of awareness: Secondary | ICD-10-CM | POA: Diagnosis not present

## 2015-06-08 DIAGNOSIS — E872 Acidosis: Secondary | ICD-10-CM | POA: Diagnosis not present

## 2015-06-08 DIAGNOSIS — I2510 Atherosclerotic heart disease of native coronary artery without angina pectoris: Secondary | ICD-10-CM | POA: Diagnosis present

## 2015-06-08 DIAGNOSIS — I248 Other forms of acute ischemic heart disease: Secondary | ICD-10-CM | POA: Diagnosis present

## 2015-06-08 DIAGNOSIS — E871 Hypo-osmolality and hyponatremia: Secondary | ICD-10-CM | POA: Diagnosis not present

## 2015-06-08 DIAGNOSIS — I701 Atherosclerosis of renal artery: Secondary | ICD-10-CM | POA: Diagnosis present

## 2015-06-08 DIAGNOSIS — E1122 Type 2 diabetes mellitus with diabetic chronic kidney disease: Secondary | ICD-10-CM | POA: Diagnosis present

## 2015-06-08 DIAGNOSIS — J9811 Atelectasis: Secondary | ICD-10-CM | POA: Diagnosis not present

## 2015-06-08 DIAGNOSIS — I429 Cardiomyopathy, unspecified: Secondary | ICD-10-CM

## 2015-06-08 DIAGNOSIS — I447 Left bundle-branch block, unspecified: Secondary | ICD-10-CM | POA: Diagnosis present

## 2015-06-08 DIAGNOSIS — I5022 Chronic systolic (congestive) heart failure: Secondary | ICD-10-CM | POA: Diagnosis present

## 2015-06-08 DIAGNOSIS — I5023 Acute on chronic systolic (congestive) heart failure: Secondary | ICD-10-CM | POA: Diagnosis not present

## 2015-06-08 DIAGNOSIS — R531 Weakness: Secondary | ICD-10-CM | POA: Diagnosis not present

## 2015-06-08 DIAGNOSIS — J811 Chronic pulmonary edema: Secondary | ICD-10-CM | POA: Diagnosis not present

## 2015-06-08 DIAGNOSIS — I5042 Chronic combined systolic (congestive) and diastolic (congestive) heart failure: Secondary | ICD-10-CM | POA: Insufficient documentation

## 2015-06-08 DIAGNOSIS — N390 Urinary tract infection, site not specified: Secondary | ICD-10-CM | POA: Diagnosis not present

## 2015-06-08 DIAGNOSIS — N170 Acute kidney failure with tubular necrosis: Secondary | ICD-10-CM | POA: Diagnosis not present

## 2015-06-08 DIAGNOSIS — Z7982 Long term (current) use of aspirin: Secondary | ICD-10-CM

## 2015-06-08 DIAGNOSIS — J9600 Acute respiratory failure, unspecified whether with hypoxia or hypercapnia: Secondary | ICD-10-CM | POA: Diagnosis not present

## 2015-06-08 DIAGNOSIS — R112 Nausea with vomiting, unspecified: Secondary | ICD-10-CM | POA: Diagnosis present

## 2015-06-08 DIAGNOSIS — I959 Hypotension, unspecified: Secondary | ICD-10-CM | POA: Diagnosis not present

## 2015-06-08 DIAGNOSIS — Z4682 Encounter for fitting and adjustment of non-vascular catheter: Secondary | ICD-10-CM | POA: Diagnosis not present

## 2015-06-08 DIAGNOSIS — I501 Left ventricular failure: Secondary | ICD-10-CM | POA: Diagnosis not present

## 2015-06-08 DIAGNOSIS — I4891 Unspecified atrial fibrillation: Secondary | ICD-10-CM | POA: Diagnosis not present

## 2015-06-08 DIAGNOSIS — N2889 Other specified disorders of kidney and ureter: Secondary | ICD-10-CM | POA: Diagnosis not present

## 2015-06-08 DIAGNOSIS — I129 Hypertensive chronic kidney disease with stage 1 through stage 4 chronic kidney disease, or unspecified chronic kidney disease: Secondary | ICD-10-CM | POA: Diagnosis present

## 2015-06-08 DIAGNOSIS — R7989 Other specified abnormal findings of blood chemistry: Secondary | ICD-10-CM | POA: Diagnosis not present

## 2015-06-08 DIAGNOSIS — K8020 Calculus of gallbladder without cholecystitis without obstruction: Secondary | ICD-10-CM | POA: Diagnosis not present

## 2015-06-08 DIAGNOSIS — Z515 Encounter for palliative care: Secondary | ICD-10-CM | POA: Diagnosis not present

## 2015-06-08 DIAGNOSIS — A419 Sepsis, unspecified organism: Secondary | ICD-10-CM | POA: Diagnosis not present

## 2015-06-08 LAB — I-STAT VENOUS BLOOD GAS, ED
Acid-base deficit: 7 mmol/L — ABNORMAL HIGH (ref 0.0–2.0)
Bicarbonate: 18.4 mEq/L — ABNORMAL LOW (ref 20.0–24.0)
O2 Saturation: 26 %
PCO2 VEN: 33.9 mmHg — AB (ref 45.0–50.0)
PH VEN: 7.341 — AB (ref 7.250–7.300)
PO2 VEN: 18 mmHg — AB (ref 30.0–45.0)
TCO2: 19 mmol/L (ref 0–100)

## 2015-06-08 LAB — COMPREHENSIVE METABOLIC PANEL
ALBUMIN: 2.6 g/dL — AB (ref 3.5–5.0)
ALT: 35 U/L (ref 14–54)
ANION GAP: 9 (ref 5–15)
AST: 36 U/L (ref 15–41)
Alkaline Phosphatase: 35 U/L — ABNORMAL LOW (ref 38–126)
BILIRUBIN TOTAL: 0.4 mg/dL (ref 0.3–1.2)
BUN: 89 mg/dL — ABNORMAL HIGH (ref 6–20)
CO2: 18 mmol/L — ABNORMAL LOW (ref 22–32)
Calcium: 8.1 mg/dL — ABNORMAL LOW (ref 8.9–10.3)
Chloride: 101 mmol/L (ref 101–111)
Creatinine, Ser: 2.85 mg/dL — ABNORMAL HIGH (ref 0.44–1.00)
GFR calc Af Amer: 17 mL/min — ABNORMAL LOW (ref >60)
GFR calc non Af Amer: 15 mL/min — ABNORMAL LOW (ref >60)
Glucose, Bld: 294 mg/dL — ABNORMAL HIGH (ref 65–99)
POTASSIUM: 4.9 mmol/L (ref 3.5–5.1)
SODIUM: 128 mmol/L — AB (ref 135–145)
TOTAL PROTEIN: 5.8 g/dL — AB (ref 6.5–8.1)

## 2015-06-08 LAB — CBC
HCT: 24.1 % — ABNORMAL LOW (ref 36.0–46.0)
Hemoglobin: 8.3 g/dL — ABNORMAL LOW (ref 12.0–15.0)
MCH: 30.2 pg (ref 26.0–34.0)
MCHC: 34.4 g/dL (ref 30.0–36.0)
MCV: 87.6 fL (ref 78.0–100.0)
PLATELETS: 227 10*3/uL (ref 150–400)
RBC: 2.75 MIL/uL — AB (ref 3.87–5.11)
RDW: 15.7 % — ABNORMAL HIGH (ref 11.5–15.5)
WBC: 11.3 10*3/uL — AB (ref 4.0–10.5)

## 2015-06-08 LAB — I-STAT CHEM 8, ED
BUN: 94 mg/dL — ABNORMAL HIGH (ref 6–20)
CALCIUM ION: 1.05 mmol/L — AB (ref 1.13–1.30)
Chloride: 99 mmol/L — ABNORMAL LOW (ref 101–111)
Creatinine, Ser: 2.7 mg/dL — ABNORMAL HIGH (ref 0.44–1.00)
GLUCOSE: 289 mg/dL — AB (ref 65–99)
HCT: 25 % — ABNORMAL LOW (ref 36.0–46.0)
HEMOGLOBIN: 8.5 g/dL — AB (ref 12.0–15.0)
Potassium: 4.8 mmol/L (ref 3.5–5.1)
Sodium: 128 mmol/L — ABNORMAL LOW (ref 135–145)
TCO2: 17 mmol/L (ref 0–100)

## 2015-06-08 LAB — TROPONIN I
TROPONIN I: 0.53 ng/mL — AB (ref <0.031)
TROPONIN I: 0.72 ng/mL — AB (ref <0.031)

## 2015-06-08 LAB — GLUCOSE, CAPILLARY: GLUCOSE-CAPILLARY: 206 mg/dL — AB (ref 65–99)

## 2015-06-08 MED ORDER — MORPHINE SULFATE 2 MG/ML IJ SOLN
1.0000 mg | INTRAMUSCULAR | Status: DC | PRN
  Administered 2015-06-09 (×2): 1 mg via INTRAVENOUS
  Filled 2015-06-08 (×2): qty 1

## 2015-06-08 MED ORDER — ONDANSETRON HCL 4 MG PO TABS
4.0000 mg | ORAL_TABLET | Freq: Four times a day (QID) | ORAL | Status: DC | PRN
  Filled 2015-06-08: qty 1

## 2015-06-08 MED ORDER — INSULIN ASPART 100 UNIT/ML ~~LOC~~ SOLN
0.0000 [IU] | Freq: Every day | SUBCUTANEOUS | Status: DC
  Administered 2015-06-08: 2 [IU] via SUBCUTANEOUS
  Administered 2015-06-09: 4 [IU] via SUBCUTANEOUS

## 2015-06-08 MED ORDER — SODIUM CHLORIDE 0.9 % IV SOLN
INTRAVENOUS | Status: DC
  Administered 2015-06-08: 22:00:00 via INTRAVENOUS

## 2015-06-08 MED ORDER — ALBUTEROL SULFATE (2.5 MG/3ML) 0.083% IN NEBU: 2.5000 mg | INHALATION_SOLUTION | RESPIRATORY_TRACT | Status: DC | PRN

## 2015-06-08 MED ORDER — ACETAMINOPHEN 325 MG PO TABS: 650.0000 mg | ORAL_TABLET | Freq: Four times a day (QID) | ORAL | Status: DC | PRN

## 2015-06-08 MED ORDER — SODIUM CHLORIDE 0.9 % IV BOLUS (SEPSIS)
1000.0000 mL | Freq: Once | INTRAVENOUS | Status: AC
  Administered 2015-06-08: 1000 mL via INTRAVENOUS

## 2015-06-08 MED ORDER — CLOPIDOGREL BISULFATE 75 MG PO TABS
75.0000 mg | ORAL_TABLET | Freq: Every day | ORAL | Status: DC
  Administered 2015-06-08 – 2015-06-12 (×5): 75 mg via ORAL
  Filled 2015-06-08 (×6): qty 1

## 2015-06-08 MED ORDER — SODIUM CHLORIDE 0.9 % IJ SOLN
3.0000 mL | Freq: Two times a day (BID) | INTRAMUSCULAR | Status: DC
  Administered 2015-06-09 – 2015-06-12 (×4): 3 mL via INTRAVENOUS

## 2015-06-08 MED ORDER — ENSURE ENLIVE PO LIQD
237.0000 mL | Freq: Two times a day (BID) | ORAL | Status: DC
  Administered 2015-06-09: 237 mL via ORAL

## 2015-06-08 MED ORDER — HEPARIN SODIUM (PORCINE) 5000 UNIT/ML IJ SOLN
5000.0000 [IU] | Freq: Three times a day (TID) | INTRAMUSCULAR | Status: DC
  Administered 2015-06-08 – 2015-06-11 (×8): 5000 [IU] via SUBCUTANEOUS
  Filled 2015-06-08 (×11): qty 1

## 2015-06-08 MED ORDER — PANTOPRAZOLE SODIUM 40 MG IV SOLR
40.0000 mg | INTRAVENOUS | Status: DC
  Administered 2015-06-08 – 2015-06-10 (×3): 40 mg via INTRAVENOUS
  Filled 2015-06-08 (×4): qty 40

## 2015-06-08 MED ORDER — ASPIRIN EC 81 MG PO TBEC
81.0000 mg | DELAYED_RELEASE_TABLET | Freq: Every day | ORAL | Status: DC
  Administered 2015-06-08 – 2015-06-09 (×2): 81 mg via ORAL
  Filled 2015-06-08 (×3): qty 1

## 2015-06-08 MED ORDER — ACETAMINOPHEN 650 MG RE SUPP: 650.0000 mg | Freq: Four times a day (QID) | RECTAL | Status: DC | PRN

## 2015-06-08 MED ORDER — ONDANSETRON HCL 4 MG/2ML IJ SOLN
4.0000 mg | Freq: Four times a day (QID) | INTRAMUSCULAR | Status: DC | PRN
  Administered 2015-06-08 – 2015-06-12 (×5): 4 mg via INTRAVENOUS
  Filled 2015-06-08 (×5): qty 2

## 2015-06-08 MED ORDER — INSULIN ASPART 100 UNIT/ML ~~LOC~~ SOLN
0.0000 [IU] | Freq: Three times a day (TID) | SUBCUTANEOUS | Status: DC
  Administered 2015-06-09: 2 [IU] via SUBCUTANEOUS
  Administered 2015-06-09: 7 [IU] via SUBCUTANEOUS

## 2015-06-08 NOTE — Progress Notes (Signed)
05/24/2015 6:11 PM  Report received. Room ready for patient.  [employer], RN-BC, [employer] [facility] Phone [phone]

## 2015-06-08 NOTE — ED Notes (Signed)
Pt placed on bedpan to attempt to urinate; pt stated "this may take a while"; pt will ring out when she is finished; Yvetta Coder, NT aware

## 2015-06-08 NOTE — ED Provider Notes (Signed)
CSN: [id number]     Arrival date & time 06/05/2015  1509 History   First MD Initiated Contact with Patient 06/03/2015 1516     Chief Complaint  Patient presents with  . Emesis  . Near Syncope     (Consider location/radiation/quality/duration/timing/severity/associated sxs/prior Treatment) Patient is a 81 y.o. female presenting with vomiting and near-syncope.  Emesis Near Syncope   Patient suffered a near syncopal event today while walking to the bathroom 1 PM. She had vomited approximately 5 times today no hematemesis. She denies other symptoms other than lightheadedness and generalized weakness, worse with standing, improved with lying down. Yesterday she had visited a dentist for dental infection, tooth #30 was prescribed Cleocin and tramadol which she has not been able to hold down due to vomiting. She denies hematemesis denies blood per rectum. Denies pain anywhere presently. She feels somewhat dehydrated. She was treated with a saline intravenous bolus 750 mL prior to arrival with improvement of symptoms Past Medical History  Diagnosis Date  . Atrophic vaginitis   . Yeast infection   . Glaucoma   . Cataracts, bilateral   . Type 2 diabetes, controlled, with renal manifestation 01/2000  . Hyperlipidemia   . Hypertension   . Congestive heart failure   . Cardiomyopathy   . LBBB (left bundle branch block)   . Chronic renal disease, stage III   . Arthritis    Past Surgical History  Procedure Laterality Date  . Vaginal delivery      x3  . Colonoscopy  05/2003  . Finger surgery    . Cataract extraction w/ intraocular lens implant Right 2011    1 year later the left side done  . Colonoscopy  06/2008    Dr. Lajoyce Corners recheck in 5 years  . Total vaginal hysterectomy  1978    for endometriosis  . Appendectomy  1950  . Squamous cell carcinoma excision  6/15    Nose  . Left and right heart catheterization with coronary angiogram N/A 03/18/2015    Procedure: LEFT AND RIGHT HEART  CATHETERIZATION WITH CORONARY ANGIOGRAM;  Surgeon: Jolaine Artist, MD;  Location: [facility] CATH LAB;  Service: Cardiovascular;  Laterality: N/A;  . Peripheral vascular catheterization N/A 03/29/2015    Procedure: Renal Intervention;  Surgeon: Serafina Mitchell, MD;  Location: [place] CV LAB;  Service: Cardiovascular;  Laterality: N/A;  . Percutaneous stent intervention Left 03/29/2015    Procedure: Percutaneous Stent Intervention;  Surgeon: Serafina Mitchell, MD;  Location: [place] [facility];  Service: Cardiovascular;  Laterality: Left;  Renal  . Eye surgery    . Abdominal hysterectomy     Family History  Problem Relation Age of Onset  . Heart disease  67  . CAD Brother   . Hypertension Brother    History  Substance Use Topics  . Smoking status: Former Smoker -- 0.10 packs/day for 2 years    Types: Cigarettes  . Smokeless tobacco: Never Used  . Alcohol Use: No   OB History    Gravida Para Term Preterm AB TAB SAB Ectopic Multiple Living   4 3   1     3      Review of Systems  HENT: Positive for dental problem.   Respiratory: Negative.   Cardiovascular: Positive for near-syncope.  Gastrointestinal: Positive for vomiting.  Musculoskeletal: Negative.   Skin: Negative.   Neurological: Positive for weakness and light-headedness.       Generalized weakness  Psychiatric/Behavioral: Negative.   All other systems  reviewed and are negative.     Allergies  Cefdinir; Macrobid; Sulfa antibiotics; Alphagan; and Azopt  Home Medications   Prior to Admission medications   Medication Sig Start Date End Date Taking? Authorizing Provider  ACCU-CHEK AVIVA PLUS test strip  04/27/13   Historical Provider, MD  acetaminophen (TYLENOL) 500 MG tablet Take 500 mg by mouth every 6 (six) hours as needed for mild pain (Using for root canal).    Historical Provider, MD  Ascorbic Acid (VITAMIN C) 1000 MG tablet Take 1,000 mg by mouth daily.    Historical Provider, MD  aspirin 81 MG tablet Take 81 mg by  mouth at bedtime.     Historical Provider, MD  atorvastatin (LIPITOR) 10 MG tablet Take 10 mg by mouth every evening.     Historical Provider, MD  beta carotene 10000 UNIT capsule Take 10,000 Units by mouth daily.    Historical Provider, MD  calcium citrate (CALCITRATE - DOSED IN MG ELEMENTAL CALCIUM) 950 MG tablet Take 1 tablet by mouth 2 (two) times daily.     Historical Provider, MD  carvedilol (COREG) 12.5 MG tablet Take 1 tablet (12.5 mg total) by mouth 2 (two) times daily with a meal. 04/24/15   Larey Dresser, MD  cholecalciferol (VITAMIN D) 1000 UNITS tablet Take 1,000 Units by mouth daily.    Historical Provider, MD  clopidogrel (PLAVIX) 75 MG tablet Take 1 tablet (75 mg total) by mouth daily. 04/02/15   Amy D Clegg, NP  docusate sodium (COLACE) 100 MG capsule Take 100 mg by mouth daily.    Historical Provider, MD  glucosamine-chondroitin 500-400 MG tablet Take 1 tablet by mouth daily.     Historical Provider, MD  glyBURIDE (DIABETA) 5 MG tablet Take 5 mg by mouth daily with breakfast.    Historical Provider, MD  iron polysaccharides (NIFEREX) 150 MG capsule Take 1 capsule (150 mg total) by mouth 2 (two) times daily. 02/18/15   Bonnielee Haff, MD  isosorbide mononitrate (IMDUR) 30 MG 24 hr tablet Take 1 tablet (30 mg total) by mouth daily. 04/04/15   Almyra Deforest, PA  L-Methylfolate-B6-B12 (METANX PO) Take 1 tablet by mouth daily.     Historical Provider, MD  Lancets (ACCU-CHEK MULTICLIX) lancets  02/26/15   Historical Provider, MD  lisinopril (PRINIVIL,ZESTRIL) 5 MG tablet Take 1 tab in AM and 1/2 tab in PM 05/29/15   Larey Dresser, MD  nitroGLYCERIN (NITROSTAT) 0.4 MG SL tablet Place 1 tablet (0.4 mg total) under the tongue every 5 (five) minutes x 3 doses as needed for chest pain. 04/04/15   Almyra Deforest, PA  Probiotic Product (PROBIOTIC DAILY PO) Take 1 tablet by mouth at bedtime. Florastor    Historical Provider, MD  sitaGLIPtin (JANUVIA) 100 MG tablet Take 100 mg by mouth daily.    Historical  Provider, MD  spironolactone (ALDACTONE) 25 MG tablet Take 12.5 mg by mouth daily.    Historical Provider, MD  torsemide (DEMADEX) 20 MG tablet Take 20 mg by mouth every other day.    Historical Provider, MD  Travoprost, BAK Free, (TRAVATAN) 0.004 % SOLN ophthalmic solution Place 1 drop into both eyes at bedtime.    Historical Provider, MD  vitamin E 400 UNIT capsule Take 400 Units by mouth daily.    Historical Provider, MD   BP 90/44 mmHg  Pulse 96  Temp(Src) 99.5 F (37.5 C) (Oral)  Resp 19  SpO2 96%  LMP 07/24/1977 Physical Exam  Constitutional: She appears well-developed and well-nourished.  No distress.  HENT:  Head: Normocephalic and atraumatic.  No fluctuance or swelling of gingiva. No dental tenderness. Mucous membranes dry  Eyes: Conjunctivae are normal. Pupils are equal, round, and reactive to light.  Neck: Neck supple. No tracheal deviation present. No thyromegaly present.  Cardiovascular: Normal rate and regular rhythm.   No murmur heard. Pulmonary/Chest: Effort normal and breath sounds normal.  Abdominal: Soft. Bowel sounds are normal. She exhibits no distension. There is no tenderness.  Musculoskeletal: Normal range of motion. She exhibits no edema or tenderness.  Neurological: She is alert. Coordination normal.  Skin: Skin is warm and dry. No rash noted.  Psychiatric: She has a normal mood and affect.  Nursing note and vitals reviewed.   ED Course  Procedures (including critical care time) Labs Review Labs Reviewed  COMPREHENSIVE METABOLIC PANEL  CBC    Imaging Review No results found.   EKG Interpretation   Date/Time:  Saturday June 08 2015 15:17:45 EDT Ventricular Rate:  97 PR Interval:    QRS Duration: 139 QT Interval:  415 QTC Calculation: 527 R Axis:   -46 Text Interpretation:  Age not entered, assumed to be  81 years old for  purpose of ECG interpretation Normal sinus rhythm Left bundle branch block  No significant change since last tracing  Confirmed by Winfred Leeds  MD, Armstrong Creasy  [phone]) on 06/10/2015 3:34:27 PM     5:45 PM patient feels improved after treatment with intravenous fluids Results for orders placed or performed during the hospital encounter of 06/09/2015  Comprehensive metabolic panel  Result Value Ref Range   Sodium 128 (L) 135 - 145 mmol/L   Potassium 4.9 3.5 - 5.1 mmol/L   Chloride 101 101 - 111 mmol/L   CO2 18 (L) 22 - 32 mmol/L   Glucose, Bld 294 (H) 65 - 99 mg/dL   BUN 89 (H) 6 - 20 mg/dL   Creatinine, Ser 2.85 (H) 0.44 - 1.00 mg/dL   Calcium 8.1 (L) 8.9 - 10.3 mg/dL   Total Protein 5.8 (L) 6.5 - 8.1 g/dL   Albumin 2.6 (L) 3.5 - 5.0 g/dL   AST 36 15 - 41 U/L   ALT 35 14 - 54 U/L   Alkaline Phosphatase 35 (L) 38 - 126 U/L   Total Bilirubin 0.4 0.3 - 1.2 mg/dL   GFR calc non Af Amer 15 (L) >60 mL/min   GFR calc Af Amer 17 (L) >60 mL/min   Anion gap 9 5 - 15  CBC  Result Value Ref Range   WBC 11.3 (H) 4.0 - 10.5 K/uL   RBC 2.75 (L) 3.87 - 5.11 MIL/uL   Hemoglobin 8.3 (L) 12.0 - 15.0 g/dL   HCT 24.1 (L) 36.0 - 46.0 %   MCV 87.6 78.0 - 100.0 fL   MCH 30.2 26.0 - 34.0 pg   MCHC 34.4 30.0 - 36.0 g/dL   RDW 15.7 (H) 11.5 - 15.5 %   Platelets 227 150 - 400 K/uL  I-stat chem 8, ed  Result Value Ref Range   Sodium 128 (L) 135 - 145 mmol/L   Potassium 4.8 3.5 - 5.1 mmol/L   Chloride 99 (L) 101 - 111 mmol/L   BUN 94 (H) 6 - 20 mg/dL   Creatinine, Ser 2.70 (H) 0.44 - 1.00 mg/dL   Glucose, Bld 289 (H) 65 - 99 mg/dL   Calcium, Ion 1.05 (L) 1.13 - 1.30 mmol/L   TCO2 17 0 - 100 mmol/L   Hemoglobin 8.5 (L) 12.0 -  15.0 g/dL   HCT 25.0 (L) 36.0 - 46.0 %  I-Stat venous blood gas, ED  Result Value Ref Range   pH, Ven 7.341 (H) 7.250 - 7.300   pCO2, Ven 33.9 (L) 45.0 - 50.0 mmHg   pO2, Ven 18.0 (LL) 30.0 - 45.0 mmHg   Bicarbonate 18.4 (L) 20.0 - 24.0 mEq/L   TCO2 19 0 - 100 mmol/L   O2 Saturation 26.0 %   Acid-base deficit 7.0 (H) 0.0 - 2.0 mmol/L   Sample type VENOUS    Comment NOTIFIED PHYSICIAN    No  results found.  MDM  Patient dehydrated, mildly worsening anemic, hypotensive with Dr.Hongalgi plan admit telemetry intravenous hydration Final diagnoses:  None   dx #1 near syncope #2 acute on chronic kidney injury #3 hyperglycemia #4 anemia #5 hypotension  Orlie Dakin, MD 05/29/2015 1755

## 2015-06-08 NOTE — ED Notes (Addendum)
EMS-patient reports poor intake, vomiting, and nausea with near syncopal episode yesterday due to dental pain. CBG 761. 88systolic. 84Q(T)TC. Patient with hx of CHF, patient given 750cc bolus en route. No sob or edema noted.

## 2015-06-08 NOTE — H&P (Signed)
History and Physical  Katie Fuentes VEH:[id number] DOB: January 31, 1934 DOA: 06/05/2015  Referring physician: Dr. Orlie Dakin, EDP PCP: Merrilee Seashore, MD  Outpatient Specialists:  1. Cardiology: Dr. Quillian Quince Bensimhon/Dr. Loralie Champagne. 2. Nephrology: Dr. Jamal Maes  Chief Complaint: Nausea, vomiting, brief episode of passing out.  HPI: Katie Fuentes is a 81 y.o. female 81 year old female patient with history of CAD, RAS, stage IV CKD, nonischemic cardiomyopathy, chronic systolic CHF, type II DM, hospitalization in March 2016 and April 2016 for CHF exacerbation, RHC/LHC during last admission showed preserved cardiac output, 60% left main stenosis-not hemodynamically significant by IVUS, developed AoCKD after cath, 95% left renal artery stenosis treated with PCI, anemia requiring 2 units PRBCs, presents with 2 days history of nausea, vomiting, weakness and an episode of briefly passing out. She saw her cardiologist on 05/29/15. Per recommendations, went for her first day of cardiac rehabilitation on Monday this week. States that she developed a kink in her neck after rehabilitation. Tuesday evening she started having right lower toothache which progressively got worse over the next couple of days-rated as moderate to severe in intensity, nonradiating, not associated with fever or chills but associated with decreased appetite. Yesterday morning she had 2 episodes of non-bloody emesis in the absence of abdominal pain or diarrhea. Normal BM on Wednesday. She went to see her dentist and an oral surgeon who determined that she has a tooth abscess and recommended dental extraction. Patient however is on Plavix and hence they plan to discuss her case with the cardiologist regarding holding Plavix for the procedure. In the interim she was started on some antibiotics and pain medications. Since returning home, patient has had a couple of episodes of nonbloody emesis, has become progressively  weak, decreased oral intake, reduced urine output and as per spouse, appears listless and lethargic. No history of eating anything unusual or eating out. No sickly contacts with similar symptoms. No dysuria or urinary frequency. This morning when he was walking her back from the bathroom, she briefly "slept off" while standing and him holding her. She does not recollect the event. He called her and she woke up. She denies chest pain, dyspnea, palpitations. She is not sure if she has dizziness or lightheadedness. She has been taking all her cardiac medications in the interim except today. Per EMS, CBG 385, SBP 88 mmHg. She was bolused with 750 mL IV fluids on route to ED. In the ED, her blood pressures have improved in the 90s-100s. She feels somewhat better. Creatinine is up to 2.85, hemoglobin 8.3. Hospitalist admission requested.   Review of Systems: All systems reviewed and apart from history of presenting illness, are negative.  Past Medical History  Diagnosis Date  . Atrophic vaginitis   . Yeast infection   . Glaucoma   . Cataracts, bilateral   . Type 2 diabetes, controlled, with renal manifestation 01/2000  . Hyperlipidemia   . Hypertension   . Congestive heart failure   . Cardiomyopathy   . LBBB (left bundle branch block)   . Chronic renal disease, stage III   . Arthritis    Past Surgical History  Procedure Laterality Date  . Vaginal delivery      x3  . Colonoscopy  05/2003  . Finger surgery    . Cataract extraction w/ intraocular lens implant Right 2011    1 year later the left side done  . Colonoscopy  06/2008    Dr. Lajoyce Corners recheck in 5 years  . Total vaginal  hysterectomy  1978    for endometriosis  . Appendectomy  1950  . Squamous cell carcinoma excision  6/15    Nose  . Left and right heart catheterization with coronary angiogram N/A 03/18/2015    Procedure: LEFT AND RIGHT HEART CATHETERIZATION WITH CORONARY ANGIOGRAM;  Surgeon: Jolaine Artist, MD;  Location: [facility] CATH LAB;   Service: Cardiovascular;  Laterality: N/A;  . Peripheral vascular catheterization N/A 03/29/2015    Procedure: Renal Intervention;  Surgeon: Serafina Mitchell, MD;  Location: [place] [facility] LAB;  Service: Cardiovascular;  Laterality: N/A;  . Percutaneous stent intervention Left 03/29/2015    Procedure: Percutaneous Stent Intervention;  Surgeon: Serafina Mitchell, MD;  Location: [place] [facility];  Service: Cardiovascular;  Laterality: Left;  Renal  . Eye surgery    . Abdominal hysterectomy     Social History:  reports that she has quit smoking. Her smoking use included Cigarettes. She has a .2 pack-year smoking history. She has never used smokeless tobacco. She reports that she does not drink alcohol or use illicit drugs. Married. Lives with spouse. Independent of activities of daily living. Spouse is at the bedside.  Allergies  Allergen Reactions  . Cefdinir Diarrhea  . Macrobid [Nitrofurantoin Macrocrystal] Nausea And Vomiting and Other (See Comments)    Fever, Chills  . Sulfa Antibiotics Nausea And Vomiting and Other (See Comments)    Fever,chills  . Alphagan [Brimonidine] Other (See Comments)    Redness in the eyes  . Azopt [Brinzolamide]     Redness in the eyes    Family History  Problem Relation Age of Onset  . Heart disease  67  . CAD Brother   . Hypertension Brother     Prior to Admission medications   Medication Sig Start Date End Date Taking? Authorizing Provider  ACCU-CHEK AVIVA PLUS test strip  04/27/13   Historical Provider, MD  acetaminophen (TYLENOL) 500 MG tablet Take 500 mg by mouth every 6 (six) hours as needed for mild pain (Using for root canal).    Historical Provider, MD  Ascorbic Acid (VITAMIN C) 1000 MG tablet Take 1,000 mg by mouth daily.    Historical Provider, MD  aspirin 81 MG tablet Take 81 mg by mouth at bedtime.     Historical Provider, MD  atorvastatin (LIPITOR) 10 MG tablet Take 10 mg by mouth every evening.     Historical Provider, MD  beta carotene  10000 UNIT capsule Take 10,000 Units by mouth daily.    Historical Provider, MD  calcium citrate (CALCITRATE - DOSED IN MG ELEMENTAL CALCIUM) 950 MG tablet Take 1 tablet by mouth 2 (two) times daily.     Historical Provider, MD  carvedilol (COREG) 12.5 MG tablet Take 1 tablet (12.5 mg total) by mouth 2 (two) times daily with a meal. 04/24/15   Larey Dresser, MD  cholecalciferol (VITAMIN D) 1000 UNITS tablet Take 1,000 Units by mouth daily.    Historical Provider, MD  clopidogrel (PLAVIX) 75 MG tablet Take 1 tablet (75 mg total) by mouth daily. 04/02/15   Amy D Clegg, NP  docusate sodium (COLACE) 100 MG capsule Take 100 mg by mouth daily.    Historical Provider, MD  glucosamine-chondroitin 500-400 MG tablet Take 1 tablet by mouth daily.     Historical Provider, MD  glyBURIDE (DIABETA) 5 MG tablet Take 5 mg by mouth daily with breakfast.    Historical Provider, MD  iron polysaccharides (NIFEREX) 150 MG capsule Take 1 capsule (150 mg  total) by mouth 2 (two) times daily. 02/18/15   Bonnielee Haff, MD  isosorbide mononitrate (IMDUR) 30 MG 24 hr tablet Take 1 tablet (30 mg total) by mouth daily. 04/04/15   Almyra Deforest, PA  L-Methylfolate-B6-B12 (METANX PO) Take 1 tablet by mouth daily.     Historical Provider, MD  Lancets (ACCU-CHEK MULTICLIX) lancets  02/26/15   Historical Provider, MD  lisinopril (PRINIVIL,ZESTRIL) 5 MG tablet Take 1 tab in AM and 1/2 tab in PM 05/29/15   Larey Dresser, MD  nitroGLYCERIN (NITROSTAT) 0.4 MG SL tablet Place 1 tablet (0.4 mg total) under the tongue every 5 (five) minutes x 3 doses as needed for chest pain. 04/04/15   Almyra Deforest, PA  Probiotic Product (PROBIOTIC DAILY PO) Take 1 tablet by mouth at bedtime. Florastor    Historical Provider, MD  sitaGLIPtin (JANUVIA) 100 MG tablet Take 100 mg by mouth daily.    Historical Provider, MD  spironolactone (ALDACTONE) 25 MG tablet Take 12.5 mg by mouth daily.    Historical Provider, MD  torsemide (DEMADEX) 20 MG tablet Take 20 mg by mouth  every other day.    Historical Provider, MD  Travoprost, BAK Free, (TRAVATAN) 0.004 % SOLN ophthalmic solution Place 1 drop into both eyes at bedtime.    Historical Provider, MD  vitamin E 400 UNIT capsule Take 400 Units by mouth daily.    Historical Provider, MD   Physical Exam: Filed Vitals:   06/16/2015 1620 06/20/2015 1700 06/19/2015 1715 06/15/2015 1800  BP: 89/48 100/52 92/58 93/47   Pulse: 93 98 97 98  Temp:      TempSrc:      Resp: 18 21 16 30   SpO2: 98% 98% 97% 97%   temperature 99.44F.   General exam: Moderately built and nourished pleasant elderly female patient, lying comfortably supine on the gurney in no obvious distress. Does not look septic or toxic.  Head, eyes and ENT: Nontraumatic and normocephalic. Pupils equally reacting to light and accommodation. Oral mucosa dry. Right lower dentition with a tooth with crown ? #30 (indicates one with pain) without acute findings or abscess.  Neck: Supple. No JVD, carotid bruit or thyromegaly.  Lymphatics: No lymphadenopathy.  Respiratory system: Clear to auscultation. No increased work of breathing.  Cardiovascular system: S1 and S2 heard, RRR. No JVD, murmurs, gallops, clicks or pedal edema.  Gastrointestinal system: Abdomen is nondistended, soft and nontender. Normal bowel sounds heard. No organomegaly or masses appreciated.  Central nervous system: Alert and oriented. No focal neurological deficits.  Extremities: Symmetric 5 x 5 power. Peripheral pulses symmetrically felt.   Skin: No rashes or acute findings.  Musculoskeletal system: Negative exam.  Psychiatry: Pleasant and cooperative.   Labs on Admission:  Basic Metabolic Panel:  Recent Labs Lab 06/16/2015 1604 06/02/2015 1616  NA 128* 128*  K 4.9 4.8  CL 101 99*  CO2 18*  --   GLUCOSE 294* 289*  BUN 89* 94*  CREATININE 2.85* 2.70*  CALCIUM 8.1*  --    Liver Function Tests:  Recent Labs Lab 05/26/2015 1604  AST 36  ALT 35  ALKPHOS 35*  BILITOT 0.4  PROT  5.8*  ALBUMIN 2.6*   No results for input(s): LIPASE, AMYLASE in the last 168 hours. No results for input(s): AMMONIA in the last 168 hours. CBC:  Recent Labs Lab 05/26/2015 1604 06/09/2015 1616  WBC 11.3*  --   HGB 8.3* 8.5*  HCT 24.1* 25.0*  MCV 87.6  --   PLT 227  --  Cardiac Enzymes:  Recent Labs Lab 05/25/2015 1614  TROPONINI 0.53*    BNP (last 3 results)  Recent Labs  03/01/15 0840  PROBNP 742.0*   CBG:  Recent Labs Lab 06/03/15 1335 06/03/15 1426  GLUCAP 126* 99    Radiological Exams on Admission: No results found.  EKG: Independently reviewed. Sinus rhythm with LBBB (old)  Assessment/Plan Active Problems:   Essential hypertension   Type 2 diabetes mellitus with renal manifestations   Normocytic anemia   Hyperlipidemia   Mitral valve regurgitation-moderate tro severe by echo 02/16/15   LBBB (left bundle branch block)   Cardiomyopathy- etiology undetermined. EF 30-35% echo 02/16/15.    Renal artery stenosis   Dehydration with hyponatremia   Acute renal failure superimposed on stage 4 chronic kidney disease   Hypotension   Renal failure (ARF), acute on chronic   Nausea and vomiting   Syncope   Chronic systolic CHF (congestive heart failure)   Dehydration with hyponatremia - Secondary to GI losses and poor oral intake + diuretics - Brief and gentle IV fluid hydration  Nausea and vomiting - DD: Acute GE versus secondary to AoCKD - Antiemetics and PPI - Diet as tolerated and monitor  Acute on stage IV chronic kidney disease - Recent baseline creatinine in the 1.9 range. Admitting creatinine 2.85 - Acute kidney injury secondary to dehydration, diuretics, ACEI &? ATN from hypotension - Temporarily hold culprit medications - Brief and gentle IV fluid hydration and follow the MP.  Hypotension - Secondary to dehydration and medications - Hold medications temporarily. Brief IV fluids. Monitor closely. - Improving.  Syncope 1 - Likely  secondary to hypotension. Monitor on telemetry. Management as above  Elevated troponin/CAD - No reported chest pain. EKG shows sinus rhythm with LBBB - Likely secondary to demand ischemia from hypotension, acute on chronic kidney disease. - Trend troponin and consider cardiac consultation if troponin continues to rise or if she becomes symptomatic. - Recently underwent LHC/RHC in April 2016-results as above  Uncontrolled type II DM with renal complications - Hold oral medications. - NovoLog SSI. May consider long-acting insulins depending on blood sugars.  Chronic systolic CHF/nonischemic cardiomyopathy - Clinically dehydrated. Temporarily hold diuretics, Imdur and other blood pressure medications - Consider resuming these medications in a.m. depending on her blood pressure.  Left renal artery stenosis status post PCI - Continue aspirin and Plavix for now. We will need to discuss with cardiology/vascular surgeons regarding holding Plavix for dental procedure  Dental pain/abscess - No acute findings on external exam at this time. Dental pain seems to have subsided. - Consider starting antibiotics after medication reconciliation complete by pharmacy. Has not started antibiotics yet.  Normocytic anemia - Hemoglobin was 10.7 on 04/04/15. Admitting hemoglobin: 8.3 - No reported bleeding, melena or hematemesis - States that she had a colonoscopy greater than 5 years ago with Dr. Lajoyce Corners and said to be okay - May be from chronic kidney disease and iron deficiency (anemia panel 3/25: Iron 19, TIBC 253, saturation 8, ferritin 46 and folate >20). - Consider iron supplements when GI issues have settled.  Essential hypertension - Currently admitted with hypotension. Management as above  LBBB  Moderate to severe mitral regurgitation  Hyperlipidemia   DVT prophylaxis: Subcutaneous heparin Code Status: Full  Family Communication: Discussed extensively with patient's spouse at bedside    Disposition Plan: DC home when medically stable   Time spent: 62 minutes  Monterio Bob, MD, FACP, FHM. [facility] Pager [phone]  If 7PM-7AM, please contact night-coverage [email or web address]  Password [email or web address] 06/16/2015, 6:46 PM

## 2015-06-09 ENCOUNTER — Inpatient Hospital Stay (HOSPITAL_COMMUNITY): Payer: Medicare Other

## 2015-06-09 ENCOUNTER — Telehealth: Payer: Self-pay | Admitting: Cardiology

## 2015-06-09 DIAGNOSIS — I340 Nonrheumatic mitral (valve) insufficiency: Secondary | ICD-10-CM

## 2015-06-09 DIAGNOSIS — R570 Cardiogenic shock: Secondary | ICD-10-CM | POA: Insufficient documentation

## 2015-06-09 DIAGNOSIS — I429 Cardiomyopathy, unspecified: Secondary | ICD-10-CM

## 2015-06-09 DIAGNOSIS — I5023 Acute on chronic systolic (congestive) heart failure: Principal | ICD-10-CM

## 2015-06-09 DIAGNOSIS — R7989 Other specified abnormal findings of blood chemistry: Secondary | ICD-10-CM

## 2015-06-09 DIAGNOSIS — J9600 Acute respiratory failure, unspecified whether with hypoxia or hypercapnia: Secondary | ICD-10-CM

## 2015-06-09 DIAGNOSIS — A419 Sepsis, unspecified organism: Secondary | ICD-10-CM

## 2015-06-09 DIAGNOSIS — R112 Nausea with vomiting, unspecified: Secondary | ICD-10-CM

## 2015-06-09 LAB — POCT I-STAT 3, ART BLOOD GAS (G3+)
ACID-BASE DEFICIT: 13 mmol/L — AB (ref 0.0–2.0)
BICARBONATE: 13 meq/L — AB (ref 20.0–24.0)
O2 SAT: 90 %
PO2 ART: 68 mmHg — AB (ref 80.0–100.0)
Patient temperature: 98.6
TCO2: 14 mmol/L (ref 0–100)
pCO2 arterial: 30 mmHg — ABNORMAL LOW (ref 35.0–45.0)
pH, Arterial: 7.243 — ABNORMAL LOW (ref 7.350–7.450)

## 2015-06-09 LAB — COMPREHENSIVE METABOLIC PANEL
ALBUMIN: 2.4 g/dL — AB (ref 3.5–5.0)
ALK PHOS: 40 U/L (ref 38–126)
ALT: 49 U/L (ref 14–54)
AST: 54 U/L — ABNORMAL HIGH (ref 15–41)
Anion gap: 11 (ref 5–15)
BUN: 94 mg/dL — ABNORMAL HIGH (ref 6–20)
CALCIUM: 8.1 mg/dL — AB (ref 8.9–10.3)
CO2: 18 mmol/L — ABNORMAL LOW (ref 22–32)
Chloride: 101 mmol/L (ref 101–111)
Creatinine, Ser: 3.3 mg/dL — ABNORMAL HIGH (ref 0.44–1.00)
GFR calc Af Amer: 14 mL/min — ABNORMAL LOW (ref >60)
GFR calc non Af Amer: 12 mL/min — ABNORMAL LOW (ref >60)
GLUCOSE: 197 mg/dL — AB (ref 65–99)
POTASSIUM: 4.8 mmol/L (ref 3.5–5.1)
SODIUM: 130 mmol/L — AB (ref 135–145)
TOTAL PROTEIN: 5.7 g/dL — AB (ref 6.5–8.1)
Total Bilirubin: 0.6 mg/dL (ref 0.3–1.2)

## 2015-06-09 LAB — CBC
HEMATOCRIT: 24.2 % — AB (ref 36.0–46.0)
Hemoglobin: 8.1 g/dL — ABNORMAL LOW (ref 12.0–15.0)
MCH: 29 pg (ref 26.0–34.0)
MCHC: 33.5 g/dL (ref 30.0–36.0)
MCV: 86.7 fL (ref 78.0–100.0)
Platelets: 240 10*3/uL (ref 150–400)
RBC: 2.79 MIL/uL — AB (ref 3.87–5.11)
RDW: 15.8 % — ABNORMAL HIGH (ref 11.5–15.5)
WBC: 12.5 10*3/uL — AB (ref 4.0–10.5)

## 2015-06-09 LAB — URINALYSIS, ROUTINE W REFLEX MICROSCOPIC
BILIRUBIN URINE: NEGATIVE
Bilirubin Urine: NEGATIVE
Glucose, UA: NEGATIVE mg/dL
Glucose, UA: NEGATIVE mg/dL
Hgb urine dipstick: NEGATIVE
KETONES UR: NEGATIVE mg/dL
Ketones, ur: NEGATIVE mg/dL
Leukocytes, UA: NEGATIVE
NITRITE: NEGATIVE
Nitrite: NEGATIVE
PH: 5 (ref 5.0–8.0)
PROTEIN: 30 mg/dL — AB
Protein, ur: NEGATIVE mg/dL
SPECIFIC GRAVITY, URINE: 1.015 (ref 1.005–1.030)
Specific Gravity, Urine: 1.012 (ref 1.005–1.030)
UROBILINOGEN UA: 0.2 mg/dL (ref 0.0–1.0)
Urobilinogen, UA: 0.2 mg/dL (ref 0.0–1.0)
pH: 5 (ref 5.0–8.0)

## 2015-06-09 LAB — CARBOXYHEMOGLOBIN
CARBOXYHEMOGLOBIN: 0.9 % (ref 0.5–1.5)
Carboxyhemoglobin: 0.9 % (ref 0.5–1.5)
METHEMOGLOBIN: 1.4 % (ref 0.0–1.5)
Methemoglobin: 1.5 % (ref 0.0–1.5)
O2 Saturation: 69.1 %
O2 Saturation: 57.7 %
TOTAL HEMOGLOBIN: 11.2 g/dL — AB (ref 12.0–16.0)
Total hemoglobin: 9.6 g/dL — ABNORMAL LOW (ref 12.0–16.0)

## 2015-06-09 LAB — BASIC METABOLIC PANEL
Anion gap: 13 (ref 5–15)
BUN: 109 mg/dL — ABNORMAL HIGH (ref 6–20)
CHLORIDE: 96 mmol/L — AB (ref 101–111)
CO2: 16 mmol/L — AB (ref 22–32)
Calcium: 7.7 mg/dL — ABNORMAL LOW (ref 8.9–10.3)
Creatinine, Ser: 4 mg/dL — ABNORMAL HIGH (ref 0.44–1.00)
GFR calc non Af Amer: 10 mL/min — ABNORMAL LOW (ref >60)
GFR, EST AFRICAN AMERICAN: 11 mL/min — AB (ref >60)
Glucose, Bld: 331 mg/dL — ABNORMAL HIGH (ref 65–99)
Potassium: 5.6 mmol/L — ABNORMAL HIGH (ref 3.5–5.1)
SODIUM: 125 mmol/L — AB (ref 135–145)

## 2015-06-09 LAB — URINE MICROSCOPIC-ADD ON

## 2015-06-09 LAB — TROPONIN I
TROPONIN I: 0.95 ng/mL — AB (ref <0.031)
Troponin I: 1.93 ng/mL (ref <0.031)

## 2015-06-09 LAB — TYPE AND SCREEN
ABO/RH(D): O POS
ANTIBODY SCREEN: NEGATIVE

## 2015-06-09 LAB — GLUCOSE, CAPILLARY
Glucose-Capillary: 199 mg/dL — ABNORMAL HIGH (ref 65–99)
Glucose-Capillary: 336 mg/dL — ABNORMAL HIGH (ref 65–99)

## 2015-06-09 LAB — LACTIC ACID, PLASMA: LACTIC ACID, VENOUS: 1.3 mmol/L (ref 0.5–2.0)

## 2015-06-09 LAB — BRAIN NATRIURETIC PEPTIDE: B Natriuretic Peptide: 3120.5 pg/mL — ABNORMAL HIGH (ref 0.0–100.0)

## 2015-06-09 LAB — MRSA PCR SCREENING: MRSA BY PCR: NEGATIVE

## 2015-06-09 MED ORDER — ATORVASTATIN CALCIUM 10 MG PO TABS
10.0000 mg | ORAL_TABLET | Freq: Every evening | ORAL | Status: DC
  Administered 2015-06-09 – 2015-06-10 (×2): 10 mg via ORAL
  Filled 2015-06-09 (×4): qty 1

## 2015-06-09 MED ORDER — PIPERACILLIN-TAZOBACTAM IN DEX 2-0.25 GM/50ML IV SOLN
2.2500 g | Freq: Four times a day (QID) | INTRAVENOUS | Status: DC
  Administered 2015-06-09 – 2015-06-12 (×12): 2.25 g via INTRAVENOUS
  Filled 2015-06-09 (×16): qty 50

## 2015-06-09 MED ORDER — FUROSEMIDE 10 MG/ML IJ SOLN: 80.0000 mg | Freq: Once | INTRAMUSCULAR | Status: DC

## 2015-06-09 MED ORDER — NOREPINEPHRINE BITARTRATE 1 MG/ML IV SOLN
0.0000 ug/min | INTRAVENOUS | Status: DC
  Administered 2015-06-09: 5 ug/min via INTRAVENOUS
  Administered 2015-06-09: 12 ug/min via INTRAVENOUS
  Filled 2015-06-09 (×2): qty 4

## 2015-06-09 MED ORDER — METOLAZONE 2.5 MG PO TABS
2.5000 mg | ORAL_TABLET | Freq: Once | ORAL | Status: AC
  Administered 2015-06-09: 2.5 mg via ORAL
  Filled 2015-06-09: qty 1

## 2015-06-09 MED ORDER — SODIUM CHLORIDE 0.9 % IV SOLN: INTRAVENOUS | Status: DC | PRN

## 2015-06-09 MED ORDER — NOREPINEPHRINE BITARTRATE 1 MG/ML IV SOLN
0.0000 ug/min | INTRAVENOUS | Status: DC
  Administered 2015-06-09: 12 ug/min via INTRAVENOUS
  Administered 2015-06-11: 40 ug/min via INTRAVENOUS
  Administered 2015-06-11: 50 ug/min via INTRAVENOUS
  Filled 2015-06-09 (×6): qty 16

## 2015-06-09 MED ORDER — VANCOMYCIN HCL IN DEXTROSE 1-5 GM/200ML-% IV SOLN
1000.0000 mg | INTRAVENOUS | Status: DC
  Administered 2015-06-09: 1000 mg via INTRAVENOUS
  Filled 2015-06-09: qty 200

## 2015-06-09 MED ORDER — FLUCONAZOLE 200 MG PO TABS
200.0000 mg | ORAL_TABLET | Freq: Every day | ORAL | Status: AC
  Administered 2015-06-09: 200 mg via ORAL
  Filled 2015-06-09: qty 1

## 2015-06-09 MED ORDER — SODIUM BICARBONATE 8.4 % IV SOLN
50.0000 meq | Freq: Once | INTRAVENOUS | Status: AC
  Administered 2015-06-09: 50 meq via INTRAVENOUS

## 2015-06-09 MED ORDER — SODIUM BICARBONATE 8.4 % IV SOLN
INTRAVENOUS | Status: AC
  Filled 2015-06-09: qty 50

## 2015-06-09 MED ORDER — FUROSEMIDE 10 MG/ML IJ SOLN
120.0000 mg | Freq: Once | INTRAVENOUS | Status: AC
  Administered 2015-06-09: 120 mg via INTRAVENOUS
  Filled 2015-06-09: qty 12

## 2015-06-09 MED ORDER — SACCHAROMYCES BOULARDII 250 MG PO CAPS
250.0000 mg | ORAL_CAPSULE | Freq: Every day | ORAL | Status: DC
  Administered 2015-06-09 – 2015-06-10 (×2): 250 mg via ORAL
  Filled 2015-06-09 (×3): qty 1

## 2015-06-09 MED ORDER — FUROSEMIDE 10 MG/ML IJ SOLN
INTRAMUSCULAR | Status: AC
  Administered 2015-06-09: 80 mg
  Filled 2015-06-09: qty 8

## 2015-06-09 NOTE — Progress Notes (Signed)
Utilization Review Completed.Haruka Kowaleski T7/17/2016  

## 2015-06-09 NOTE — Progress Notes (Addendum)
ANTIBIOTIC CONSULT NOTE - INITIAL  Pharmacy Consult for Vancomycin + Zosyn Indication: sepsis  Allergies  Allergen Reactions  . Cefdinir Diarrhea  . Macrobid [Nitrofurantoin Macrocrystal] Nausea And Vomiting and Other (See Comments)    Fever, Chills  . Sulfa Antibiotics Nausea And Vomiting and Other (See Comments)    Fever,chills  . Alphagan [Brimonidine] Other (See Comments)    Redness in the eyes  . Azopt [Brinzolamide]     Redness in the eyes    Patient Measurements: Height: 5\' 2"  (157.5 cm) Weight: 161 lb 6 oz (73.2 kg) IBW/kg (Calculated) : 50.1  Vital Signs: Temp: 98.7 F (37.1 C) (07/17 1130) Temp Source: Oral (07/17 1130) BP: 112/51 mmHg (07/17 1430) Pulse Rate: 105 (07/17 1430) Intake/Output from previous day: 07/16 0701 - 07/17 0700 In: 1750 [I.V.:1750] Out: 300 [Urine:300] Intake/Output from this shift: Total I/O In: 101.3 [I.V.:101.3] Out: -   Labs:  Recent Labs  05/26/2015 1604 06/04/2015 1616 06/09/15 0709  WBC 11.3*  --  12.5*  HGB 8.3* 8.5* 8.1*  PLT 227  --  240  CREATININE 2.85* 2.70* 3.30*   Estimated Creatinine Clearance: 12.9 mL/min (by C-G formula based on Cr of 3.3). No results for input(s): VANCOTROUGH, VANCOPEAK, VANCORANDOM, GENTTROUGH, GENTPEAK, GENTRANDOM, TOBRATROUGH, TOBRAPEAK, TOBRARND, AMIKACINPEAK, AMIKACINTROU, AMIKACIN in the last 72 hours.   Microbiology: Recent Results (from the past 720 hour(s))  MRSA PCR Screening     Status: None   Collection Time: 06/09/15 11:51 AM  Result Value Ref Range Status   MRSA by PCR NEGATIVE NEGATIVE Final    Comment:        The GeneXpert MRSA Assay (FDA approved for NASAL specimens only), is one component of a comprehensive MRSA colonization surveillance program. It is not intended to diagnose MRSA infection nor to guide or monitor treatment for MRSA infections.     Medical History: Past Medical History  Diagnosis Date  . Atrophic vaginitis   . Yeast infection   . Glaucoma    . Cataracts, bilateral   . Type 2 diabetes, controlled, with renal manifestation 01/2000  . Hyperlipidemia   . Hypertension   . Congestive heart failure   . Cardiomyopathy   . LBBB (left bundle branch block)   . Chronic renal disease, stage III   . Arthritis    Assessment: 81 yo f admitted on 7/16 for nausea, vomiting, and a brief episode of passing out.  Pharmacy is consulted to dose vancomycin and Zosyn for sepsis.  Patient has recently been hospitalized in March and April of this year.  Of note, patient has been receiving clindamycin as an outpatient for a dental abscess.  Patient also has a PMH of stage IV CKD, and her SCr has been rising (1.9>2.9>3.3), CrCl ~ 13 ml/min. Wbc 12.5, tmax/24h 99.5.  Vancomycin 7/17 >> Zosyn 7/17 >>  MRSA PCR neg 7/17 BCx: sent  Goal of Therapy:  Vancomycin trough level 15-20 mcg/ml  Plan:  Begin vancomycin 1,000 mg IV q48h Begin Zosyn 2.25 gm IV q6h Monitor cultures, CBC, renal function, clinical course  Cassie L. Nicole Kindred, PharmD Clinical Pharmacy Resident Pager: [phone] 06/09/2015 3:03 PM

## 2015-06-09 NOTE — Progress Notes (Signed)
Patient c/o discomfort and pressure on the bladder, RN bladder scanned patient, residual showed 282 cc. RN attempted to assist patient on the [facility], patient was unable to void. MD notified, orders given to perform In and out catheterization. Out put from cath was 300 cc., pt. States, feeling better, call bell within reach. RN will continue to monitor.    Sharene Skeans, RN

## 2015-06-09 NOTE — Progress Notes (Addendum)
PROGRESS NOTE    Katie Fuentes CNO:[id number] DOB: 1933/12/29 DOA: 06/14/2015 PCP: Merrilee Seashore, MD  1. Cardiology: Dr. Quillian Quince Bensimhon/Dr. Loralie Champagne. 2. Nephrology: Dr. Jamal Maes  HPI/Brief narrative 81 year old female patient with history of CAD, RAS, stage IV CKD, nonischemic cardiomyopathy, chronic systolic CHF, type II DM, hospitalization in March 2016 and April 2016 for CHF exacerbation, RHC/LHC during last admission showed preserved cardiac output, 60% left main stenosis-not hemodynamically significant by IVUS, developed AoCKD after cath, 95% left renal artery stenosis treated with PCI, anemia requiring 2 units PRBCs, presents with 2 days history of nausea, vomiting, weakness and an episode of briefly passing out. Recently started cardiac rehabilitation and on first day complained of kink in the neck. For the last 4-5 days, issues with nausea, vomiting, poor appetite, generalized weakness, dental pain-seen by a dentist and awaiting dental extraction pending clearance from vascular surgery/cardiology regarding holding Plavix. Per EMS, SBP in the 80s, bolused with 750 mL IVF. In ED and remained hypotensive in the 80s but improved after IV fluids to 90s-100s, creatinine 2.85, hemoglobin 8.3, EKG: Old LBBB, troponin elevated at 0.5. Admitted to telemetry for dehydration related to GI losses, nausea & vomiting, acute on chronic kidney disease and hypotension. Hydrated overnight with gentle IV fluids. On 7/17 a.m., again hypotensive in the 80s, complaining of dyspnea and weakness. Creatinine has increased to 3.3, troponin has peaked at 1.9, clinical exam and chest x-ray consistent with CHF. Seen patient with Dr. Radford Pax, cardiology and agree to transfer patient to ICU under CCM care. Contacted CCM M.D. on call. Subsequently Dr. Radford Pax indicated that patient will be transferred to [facility] under cardiology service due to worsening EF and she advised canceling CCM  consult.   Assessment/Plan:  Acute on chronic systolic CHF/nonischemic cardiomyopathy - Admitted with dehydration picture. Diuretics were temporarily held. Briefly hydrated with IV fluids. This morning with clinical picture suggestive of decompensated CHF. Cardiology consulted. Transferred to stepdown under cardiology service. Patient will be getting central line for monitoring of CVP, pressors (levophed started on floor prior to transfer) and diuretics. - Repeat echo 7/17 a looks worse with LVEF 20%, severe MR and trivial pericardial effusion. - Frequent admissions for CHF exacerbations.  Cardiogenic shock - Management as above. Vasopressors. Management per cardiology.  Nausea and vomiting - Unclear etiology. DD: Acute GE/AoCKD/other etio - Continue antiemetics and PPI.  Acute on stage IV chronic kidney disease - Recent baseline creatinine: 1.9 range. Admitted with creatinine 2.85. - Acute kidney injury secondary to dehydration, diuretics, ACEI, possible ATN from hypotension - Temporarily holding culprit medications - Creatinine has increased on 7/17-3.3. - Treat cardiogenic shock and monitor. Consider nephrology consultation if not improving.  Syncope 1 - Secondary to hypotension. Telemetry without arrhythmia's  Uncontrolled type II DM with renal complications - Oral medications held. Continue SSI. Mildly uncontrolled and fluctuating.  Elevated troponin-? Demand ischemia versus NSTEMI/CAD - No reported chest pain. EKG on admission showed LBBB. - Management per cardiology.  Left renal artery stenosis status post PCI - Continued on aspirin and Plavix for now. Await cardiology/vascular surgery recommendations regarding holding Plavix for dental extraction.  Dental pain/abscess - No acute findings on external exam at this time. Dental pain seems to have subsided. - discussed with Cards> starting Vanc & Zosyn.  Normocytic anemia - Hemoglobin was 10.7 on 04/04/15. Admitting  hemoglobin: 8.3 - No reported bleeding, melena or hematemesis - States that she had a colonoscopy greater than 5 years ago with Dr. Lajoyce Corners and said to be okay -  May be from chronic kidney disease and iron deficiency (anemia panel 3/25: Iron 19, TIBC 253, saturation 8, ferritin 46 and folate >20). - Consider iron supplements when GI issues have settled.  Essential hypertension - Currently in shock  LBBB - old  Moderate to severe mitral regurgitation  Hyperlipidemia  Dehydration with hyponatremia - Admitted for dehydration but now volume overloaded. Hyponatremia may be from CHF.    DVT prophylaxis: Subcutaneous heparin Code Status: Full Family Communication: Discussed with spouse at bedside on 7/17 Disposition Plan: Transferring patient to stepdown unit/ICU under cardiology service.   Consultants:  Cardiology  Procedures:  None  Antibiotics:  None   Subjective: Complaining of dyspnea since this morning but no chest pain. Had couple of episodes of nausea and nonbloody emesis overnight. As per nursing, SBP in the 80s.  Objective: Filed Vitals:   06/09/15 1145 06/09/15 1211 06/09/15 1215 06/09/15 1230  BP: 89/57 89/49 115/44 126/64  Pulse: 122 117 123 118  Temp:      TempSrc:      Resp: 27 31 35 31  Height:      Weight:      SpO2: 97% 97% 95% 97%    Intake/Output Summary (Last 24 hours) at 06/09/15 1306 Last data filed at 06/09/15 1300  Gross per 24 hour  Intake 1813.81 ml  Output    300 ml  Net 1513.81 ml   Filed Weights   06/21/2015 2308 06/09/15 0338 06/09/15 1130  Weight: 69.8 kg (153 lb 14.1 oz) 69.8 kg (153 lb 14.1 oz) 73.2 kg (161 lb 6 oz)     Exam:  General exam: Elderly female propped up in bed with mild respiratory distress Respiratory system: Reduced breath sounds bilaterally with scattered few bibasal crackles. Mild increased work of breathing. Cardiovascular system: S1 & S2 heard, RRR. No JVD, murmurs, gallops, clicks. Trace bilateral ankle  edema. Telemetry: Sinus tachycardia in the 100s, BBB morphology. Gastrointestinal system: Abdomen is nondistended, soft and nontender. Normal bowel sounds heard. Central nervous system: Alert and oriented. No focal neurological deficits. Extremities: Symmetric 5 x 5 power.   Data Reviewed: Basic Metabolic Panel:  Recent Labs Lab 06/05/2015 1604 05/25/2015 1616 06/09/15 0709  NA 128* 128* 130*  K 4.9 4.8 4.8  CL 101 99* 101  CO2 18*  --  18*  GLUCOSE 294* 289* 197*  BUN 89* 94* 94*  CREATININE 2.85* 2.70* 3.30*  CALCIUM 8.1*  --  8.1*   Liver Function Tests:  Recent Labs Lab 05/27/2015 1604 06/09/15 0709  AST 36 54*  ALT 35 49  ALKPHOS 35* 40  BILITOT 0.4 0.6  PROT 5.8* 5.7*  ALBUMIN 2.6* 2.4*   No results for input(s): LIPASE, AMYLASE in the last 168 hours. No results for input(s): AMMONIA in the last 168 hours. CBC:  Recent Labs Lab 06/03/2015 1604 06/20/2015 1616 06/09/15 0709  WBC 11.3*  --  12.5*  HGB 8.3* 8.5* 8.1*  HCT 24.1* 25.0* 24.2*  MCV 87.6  --  86.7  PLT 227  --  240   Cardiac Enzymes:  Recent Labs Lab 06/20/2015 1614 06/11/2015 2041 06/09/15 0049 06/09/15 0709  TROPONINI 0.53* 0.72* 0.95* 1.93*   BNP (last 3 results)  Recent Labs  03/01/15 0840  PROBNP 742.0*   CBG:  Recent Labs Lab 06/03/15 1335 06/03/15 1426 05/24/2015 2224 06/09/15 0606  GLUCAP 126* 99 206* 199*    No results found for this or any previous visit (from the past 240 hour(s)).  Studies: Dg Chest Port 1 View  06/09/2015   CLINICAL DATA:  CHF.  Low blood pressure.  EXAM: PORTABLE CHEST - 1 VIEW  COMPARISON:  04/02/2015  FINDINGS: Lungs are hyperinflated. The heart is enlarged. There are perihilar changes of pulmonary edema. More confluent opacity at the left base may represent edema or infectious process. Suspect left pleural effusion.  IMPRESSION: 1. Changes consistent with pulmonary edema. 2. Left lower lobe infiltrate or confluent edema.   Electronically Signed    By: Nolon Nations M.D.   On: 06/09/2015 10:37        Scheduled Meds: . aspirin EC  81 mg Oral Daily  . clopidogrel  75 mg Oral Daily  . feeding supplement (ENSURE ENLIVE)  237 mL Oral BID BM  . furosemide  80 mg Intravenous Once  . heparin  5,000 Units Subcutaneous 3 times per day  . insulin aspart  0-5 Units Subcutaneous QHS  . insulin aspart  0-9 Units Subcutaneous TID WC  . pantoprazole (PROTONIX) IV  40 mg Intravenous Q24H  . sodium bicarbonate      . sodium chloride  3 mL Intravenous Q12H   Continuous Infusions: . norepinephrine (LEVOPHED) Adult infusion 10 mcg/min (06/09/15 1200)    Active Problems:   Essential hypertension   Type 2 diabetes mellitus with renal manifestations   Normocytic anemia   Hyperlipidemia   Acute combined systolic and diastolic congestive heart failure   Mitral valve regurgitation-moderate tro severe by echo 02/16/15   LBBB (left bundle branch block)   Cardiomyopathy- etiology undetermined. EF 30-35% echo 02/16/15.    Renal artery stenosis   Dehydration with hyponatremia   Acute renal failure superimposed on stage 4 chronic kidney disease   Hypotension   Renal failure (ARF), acute on chronic   Nausea and vomiting   Syncope   Chronic systolic CHF (congestive heart failure)    Time spent: 45 minutes.    Vernell Leep, MD, FACP, FHM. [facility] Pager [phone]  If 7PM-7AM, please contact night-coverage [email or web address] Password [email or web address] 06/09/2015, 1:06 PM    LOS: 1 day

## 2015-06-09 NOTE — Consult Note (Addendum)
Admit date: 05/25/2015 Referring Physician  Dr. Algis Liming Primary Physician  Dr. Ashby Dawes Primary Cardiologist  Dr. Haroldine Laws Reason for Consultation  Hypotension and hypoxia  HPI: 81 year old female patient with history of CAD, RAS, stage IV CKD, nonischemic cardiomyopathy, chronic systolic CHF, type II DM, hospitalization in March 2016 and April 2016 for CHF exacerbation, RHC/LHC during last admission showed preserved cardiac output, 60% left main stenosis-not hemodynamically significant by IVUS, developed AoCKD after cath, 95% left renal artery stenosis treated with PCI, anemia requiring 2 units PRBCs.  She presents with 2 day history of nausea, vomiting, weakness and an episode of briefly passing out. She saw her cardiologist on 05/29/15 and per recommendations, went for her first day of cardiac rehabilitation on Monday this week. States that she developed a kink in her neck after rehabilitation. Tuesday evening she started having right lower toothache which progressively got worse over the next couple of days-rated as moderate to severe in intensity, nonradiating, not associated with fever or chills but associated with decreased appetite. Yesterday morning she had 2 episodes of non-bloody emesis in the absence of abdominal pain or diarrhea. Normal BM on Wednesday. She went to see her dentist and an oral surgeon who determined that she has a tooth abscess and recommended dental extraction. Patient however is on Plavix and hence they planned to discuss her case with the cardiologist regarding holding Plavix for the procedure. In the interim she was started on some antibiotics but never got the prescription filled and pain medications. The atient has had a couple of episodes of nonbloody emesis, has become progressively weak, decreased oral intake, reduced urine output and as per spouse, appears listless and lethargic. No history of eating anything unusual or eating out. No sickly contacts with similar  symptoms. No dysuria or urinary frequency. Yesterday  morning when her husband was walking her back from the bathroom, she briefly "slept off" while standing and him holding her. She does not recollect the event. He called to her and she woke up. She denies chest pain, dyspnea, palpitations. She is not sure if she haf dizziness or lightheadedness. She has been taking all her cardiac medications in the interim except day of admission. Per EMS, CBG 385, SBP 88 mmHg. She was bolused with 750 mL IV fluids on route to ED. In the ED, her blood pressures have improved in the 90s-100s. She felt somewhat better. Creatinine was up to 2.85 on admission, hemoglobin 8.3. Hospitalist admitted patient.  She was given more IVF resuscitation for what was felt to be dehydration.  Her diuretics, nitrates and ACE I were held.    This am, she developed hypotension with SBP 71mmHg.  She developed mild chest pressure and increased SOB with increased work of breathing. O2 sats 94% on RA.  Stat chest xray showed pulmonary edema with ? LLL infiltrate vs. Edema with perihilar edema.  Stat 2D echo showed worsening LVF with EF 25% and moderate to severe MR.  EKG showed low voltage QRS with nonspecific IVCD.  Cardiology is now asked to consult.       PMH:   Past Medical History  Diagnosis Date  . Atrophic vaginitis   . Yeast infection   . Glaucoma   . Cataracts, bilateral   . Type 2 diabetes, controlled, with renal manifestation 01/2000  . Hyperlipidemia   . Hypertension   . Congestive heart failure   . Cardiomyopathy   . LBBB (left bundle branch block)   . Chronic renal disease, stage  III   . Arthritis      PSH:   Past Surgical History  Procedure Laterality Date  . Vaginal delivery      x3  . Colonoscopy  05/2003  . Finger surgery    . Cataract extraction w/ intraocular lens implant Right 2011    1 year later the left side done  . Colonoscopy  06/2008    Dr. Lajoyce Corners recheck in 5 years  . Total vaginal hysterectomy   1978    for endometriosis  . Appendectomy  1950  . Squamous cell carcinoma excision  6/15    Nose  . Left and right heart catheterization with coronary angiogram N/A 03/18/2015    Procedure: LEFT AND RIGHT HEART CATHETERIZATION WITH CORONARY ANGIOGRAM;  Surgeon: Jolaine Artist, MD;  Location: [facility] CATH LAB;  Service: Cardiovascular;  Laterality: N/A;  . Peripheral vascular catheterization N/A 03/29/2015    Procedure: Renal Intervention;  Surgeon: Serafina Mitchell, MD;  Location: [place] CV LAB;  Service: Cardiovascular;  Laterality: N/A;  . Percutaneous stent intervention Left 03/29/2015    Procedure: Percutaneous Stent Intervention;  Surgeon: Serafina Mitchell, MD;  Location: [place] [facility] LAB;  Service: Cardiovascular;  Laterality: Left;  Renal  . Eye surgery    . Abdominal hysterectomy      Allergies:  Cefdinir; Macrobid; Sulfa antibiotics; Alphagan; and Azopt Prior to Admit Meds:   Prescriptions prior to admission  Medication Sig Dispense Refill Last Dose  . acetaminophen (TYLENOL) 500 MG tablet Take 500 mg by mouth every 6 (six) hours as needed for mild pain (Using for root canal).   06/07/2015 at Unknown time  . Ascorbic Acid (VITAMIN C) 1000 MG tablet Take 1,000 mg by mouth daily.   05/27/2015 at Unknown time  . aspirin 81 MG tablet Take 81 mg by mouth at bedtime.    06/07/2015 at Unknown time  . atorvastatin (LIPITOR) 10 MG tablet Take 10 mg by mouth every evening.    06/07/2015 at Unknown time  . beta carotene 10000 UNIT capsule Take 10,000 Units by mouth daily.   05/31/2015 at Unknown time  . calcium citrate (CALCITRATE - DOSED IN MG ELEMENTAL CALCIUM) 950 MG tablet Take 1 tablet by mouth 2 (two) times daily.    05/30/2015 at Unknown time  . carvedilol (COREG) 12.5 MG tablet Take 1 tablet (12.5 mg total) by mouth 2 (two) times daily with a meal. 60 tablet 6 06/07/2015 at Unknown time  . cholecalciferol (VITAMIN D) 1000 UNITS tablet Take 1,000 Units by mouth daily.   06/21/2015 at Unknown  time  . clopidogrel (PLAVIX) 75 MG tablet Take 1 tablet (75 mg total) by mouth daily. 30 tablet 6 06/04/2015 at Unknown time  . docusate sodium (COLACE) 100 MG capsule Take 100 mg by mouth daily.   06/04/2015 at Unknown time  . glucosamine-chondroitin 500-400 MG tablet Take 1 tablet by mouth daily.    06/23/2015 at Unknown time  . glyBURIDE (DIABETA) 5 MG tablet Take 5 mg by mouth daily with breakfast.   06/14/2015 at Unknown time  . iron polysaccharides (NIFEREX) 150 MG capsule Take 1 capsule (150 mg total) by mouth 2 (two) times daily. 60 capsule 2 06/07/2015 at Unknown time  . isosorbide mononitrate (IMDUR) 30 MG 24 hr tablet Take 1 tablet (30 mg total) by mouth daily. 90 tablet 3 06/16/2015 at Unknown time  . L-Methylfolate-B6-B12 (METANX PO) Take 1 tablet by mouth daily.    06/07/2015 at Unknown time  .  lisinopril (PRINIVIL,ZESTRIL) 5 MG tablet Take 1 tab in AM and 1/2 tab in PM 45 tablet 6 06/07/2015 at Unknown time  . nitroGLYCERIN (NITROSTAT) 0.4 MG SL tablet Place 1 tablet (0.4 mg total) under the tongue every 5 (five) minutes x 3 doses as needed for chest pain. 25 tablet 3 PRN  . Probiotic Product (PROBIOTIC DAILY PO) Take 1 tablet by mouth at bedtime. Florastor   06/07/2015 at Unknown time  . sitaGLIPtin (JANUVIA) 100 MG tablet Take 100 mg by mouth daily.   06/18/2015 at Unknown time  . spironolactone (ALDACTONE) 25 MG tablet Take 12.5 mg by mouth daily.   06/07/2015 at Unknown time  . torsemide (DEMADEX) 20 MG tablet Take 20 mg by mouth every other day.   06/07/2015 at Unknown time  . Travoprost, BAK Free, (TRAVATAN) 0.004 % SOLN ophthalmic solution Place 1 drop into both eyes at bedtime.   06/07/2015 at Unknown time  . vitamin E 400 UNIT capsule Take 400 Units by mouth daily.   06/03/2015 at Unknown time   Fam HX:    Family History  Problem Relation Age of Onset  . Heart disease  67  . CAD Brother   . Hypertension Brother    Social HX:    History   Social History  . Marital Status: Married     Spouse Name: N/A  . Number of Children: N/A  . Years of Education: N/A   Occupational History  . Not on file.   Social History Main Topics  . Smoking status: Former Smoker -- 0.10 packs/day for 2 years    Types: Cigarettes  . Smokeless tobacco: Never Used  . Alcohol Use: No  . Drug Use: No  . Sexual Activity: Yes    Birth Control/ Protection: Surgical     Comment: TVH   Other Topics Concern  . Not on file   Social History Narrative     ROS:  All 11 ROS were addressed and are negative except what is stated in the HPI  Physical Exam: Blood pressure 90/38, pulse 103, temperature 98.9 F (37.2 C), temperature source Oral, resp. rate 20, height 5\' 2"  (1.575 m), weight 153 lb 14.1 oz (69.8 kg), last menstrual period 07/24/1977, SpO2 94 %.    General: Well developed, well nourished, in mild respiratory distress Head: Eyes PERRLA, No xanthomas.   Normal cephalic and atramatic  Lungs:   Decreased BS with diffuse expiratory wheezes Heart:  tachycardic HRRR S1 S2 Pulses are 2+ & equal. Abdomen: Bowel sounds are positive, abdomen soft and non-tender without masses  Extremities:   No clubbing, cyanosis or edema.  DP +1 Neuro: Alert and oriented X 3. Psych:  Good affect, responds appropriately    Labs:   Lab Results  Component Value Date   WBC 12.5* 06/09/2015   HGB 8.1* 06/09/2015   HCT 24.2* 06/09/2015   MCV 86.7 06/09/2015   PLT 240 06/09/2015    Recent Labs Lab 06/09/15 0709  NA 130*  K 4.8  CL 101  CO2 18*  BUN 94*  CREATININE 3.30*  CALCIUM 8.1*  PROT 5.7*  BILITOT 0.6  ALKPHOS 40  ALT 49  AST 54*  GLUCOSE 197*   No results found for: PTT Lab Results  Component Value Date   INR 1.10 03/16/2015   INR 1.11 02/14/2015   Lab Results  Component Value Date   CKTOTAL 46 03/31/2015   CKMB 6.5* 03/31/2015   TROPONINI 1.93* 06/09/2015     Lab  Results  Component Value Date   CHOL 138 04/04/2015   CHOL 125 02/16/2015   Lab Results  Component Value  Date   HDL 36* 04/04/2015   HDL 27* 02/16/2015   Lab Results  Component Value Date   LDLCALC 61 04/04/2015   LDLCALC 62 02/16/2015   Lab Results  Component Value Date   TRIG 206* 04/04/2015   TRIG 180* 02/16/2015   Lab Results  Component Value Date   CHOLHDL 3.8 04/04/2015   CHOLHDL 4.6 02/16/2015   No results found for: LDLDIRECT    Radiology:  Dg Chest Port 1 View  06/09/2015   CLINICAL DATA:  CHF.  Low blood pressure.  EXAM: PORTABLE CHEST - 1 VIEW  COMPARISON:  04/02/2015  FINDINGS: Lungs are hyperinflated. The heart is enlarged. There are perihilar changes of pulmonary edema. More confluent opacity at the left base may represent edema or infectious process. Suspect left pleural effusion.  IMPRESSION: 1. Changes consistent with pulmonary edema. 2. Left lower lobe infiltrate or confluent edema.   Electronically Signed   By: Nolon Nations M.D.   On: 06/09/2015 10:37    EKG:  NSR with low voltage QRS and nonspecific IVCD  ASSESSMENT/PLAN: 1.  Hypotension worrisome for cardiogenic shock.  Other possibilities could be septic shock from abcessed tooth although less likely  (she has not been on antibx and has had N/V and decreased PO intake).  Her CBC is elevated mildly but this could be a stress reaction.  2D echo shows worsening LVF with EF 25% (prior echo 01/2015 EF 30-35%).  Start Levophed for pressor support.  Will need Central line and get CVP to determine fluid status.  Will check a Co-ox.  I have discussed case with Dr. Haroldine Laws and he will see patient today. 2.  Acute on chronic systolic CHF - chest xray shows pulmonary edema and patient in respiratory distress with increased RR of 40/min.  Will give Lasix 80mg  IV now.  She has had about 3L IVF since admission.  BB/ACE I on hold due to hypotension. 3.  Acute on chronic kidney disease.  Renal function has worsened despite volume resuscitation possibly from poor CO and reduced renal blood flow. 4.  Chest pain with elevated  troponin trending upward.  ? Due to demand ischemia in the setting of acute renal failure vs. Coronary ischemia.  She had a cath a few months ago showing a 60% LM stenosis that was not significant by IVUS.  ? Whether this has worsened in the short time interval since her cath or may actually be hemodynamically significant.  Will continue to cycle enzymes 5.  ASCAD with R/L heart cath 03/18/3015 showing normal RHC pressures with CO 3.5, PCW 12, high SVR, heavily calcified LM lesion 60-70% stenosed, 50-60% ostial LCx, normal RCA, 30-40% prox LAD with heavy calcification and 95% ostial left RAS.  IVUS of LM 6.22mm2 in tightest area 6.  Abcessed lower tooth with plans to extract once determined by oral surgery.  She is currently no on antibx.  She was given a prescription but did not fill.  Will get blood cx x 2 and then start antibx per Dr. Haroldine Laws. 7.  Anemia with worsening Hbg now down to 8 - this needs to be evaluated further once stable if she has not had endoscopy recently.  Will send off iron panel and ferritin.  Patient is critically ill with cardiogenic shock and acute CHF.  I have spent a total of 60 minutes of critical  care time with patient, of which, >50% was spent in direct patient care.   Sueanne Margarita, MD  06/09/2015  11:48 AM

## 2015-06-09 NOTE — Progress Notes (Signed)
Patient's second troponin .72, EKG done, patient is resting comfortable, chest pain free, MD on call notified of the lab value, no new orders at this time. RN will continue monitor.

## 2015-06-09 NOTE — Progress Notes (Signed)
3rd troponin .95, patient chest pain free, MD on call notified, no orders at this time. RN will continue to monitor.   Sharene Skeans, RN

## 2015-06-09 NOTE — Progress Notes (Signed)
Pt's bp. Went down to 80/31 paged doctor he ordered a bouls of 500cc at 200cc per hour

## 2015-06-09 NOTE — Progress Notes (Signed)
Transferred  pt to [place] gave report at bedside

## 2015-06-09 NOTE — Progress Notes (Addendum)
  ADVANCED HF CONSULT NOTE  Patient seen and examined. Discussed with Dr. Radford Pax.   81 y/o woman well known to me from recent prolonged admission in April 2016 with severe HTN, decompensated systolic HF with EF 11-03%, moderate CAD and stage 4 CKD with baseline creatinine 1.6-2.0. During that admission had several episodes of flash pulmonary edema in setting of severe HTN. Cath with severely calcified coronaries and question of significant high-grade LM lesion however IVUS showed it to be 60%. Cath also showed high-grade right RAS which was stented. Hospitalization c/b AKI with peak creatinine 3.3.  She was discharged to SNF and was doing well until recently when she was found to have possible tooth abscess. Saw a dentist who put her on clindamycin but was awaiting cardiology clearance to proceed given plavix. Came to ER with nausea, vomiting, poor appetite, generalized weakness, dental pain. Denied F/C. Found to be hypotensive with SBP 80s.  Admitted for possible dehydration. IVF started and she developed CHF.  Cr 1.9-> 2.9-> 3.3. WBC 12.5. BNP 3120 (baseline 320). Weigh 153 on admit (was 151 on 7/6).   Cardiology consulted and started norepi and moved to ICU.   One exam. Pale and tachypneic. Sats 95% on NRB. SBP 90-100 on norepi 12 HEENT: oropharynx clear with no obvious abscess Cor. Tachy regular, No s3. 2/6 MR Lungs: diffuse crackles Ab: soft NT/ND Ext: warm. No edema. (on levophed) Neuro: nonfocal. Alert and oriented. Pleasant affect.   Initial ABG 1.59/45/85/92% -> c/w metabolic acidosis primary (given 1 amp bicarb)  ECHO EF 20% global HK severe MR  I placed central line. CVP 8. Co-ox 69% on norepi 12  A/P: 1) Shock cardiogenic vs septic 2) Acute on chronic systolic HF 3) Acute respiratory failure 4) A/C renal failure stage 4 5) Elevated troponin - suspect HF +/- demand ischemia 6) Anemia of chronic disease   My suspicion is that this is primarily low-output HF but sepsis also a  possibility. CVP and co-ox ok now on levophed. Will check lactate. Continue pressor support. Get [facility] [place]. Start vanc/zosyn for now - can stop in 48 hours if cx negative. Hold lasix for now (resume as needed).  Worsening EF, LBBB and low volts on ECG, progressive renal failure and anemia concern me for possible amyloid. Not candidate for cMRI due to CKD. Will check SPEP and UPEP.  Suspect her kidneys will get worse before they get better and may need to consider CRRT if respiratory status worse and CVP climbing. May need arterial line for closer monitoring.   Patient and family updated. She is full code.  The patient is critically ill with multiple organ systems failure and requires high complexity decision making for assessment and support, frequent evaluation and titration of therapies, application of advanced monitoring technologies and extensive interpretation of multiple databases.   Critical Care Time devoted to patient care services described in this note is 60 minutes not including time for central line placement.   Christyann Manolis,MD 1:52 PM

## 2015-06-09 NOTE — Progress Notes (Signed)
Placed foley with 300cc of clear yellow urine out

## 2015-06-09 NOTE — Progress Notes (Signed)
Pt with upper respiratory wheezes, increasing HR, CVP 16. Dr. Pierre Bali made aware. Orders received for zaroxlyn and 120mg  IV lasix. Will continue to monitor.

## 2015-06-09 NOTE — Telephone Encounter (Signed)
Error

## 2015-06-09 NOTE — Procedures (Signed)
Central Venous Catheter Insertion Procedure Note Katie Fuentes [id number] [birth year]  Procedure: Insertion of Central Venous Catheter Indications: Assessment of intravascular volume  Procedure Details Consent: Risks of procedure as well as the alternatives and risks of each were explained to the (patient/caregiver).  Consent for procedure obtained. Time Out: Verified patient identification, verified procedure, site/side was marked, verified correct patient position, special equipment/implants available, medications/allergies/relevent history reviewed, required imaging and test results available.  Performed  Maximum sterile technique was used including antiseptics, cap, gloves, gown, hand hygiene, mask and sheet. Skin prep: Chlorhexidine; local anesthetic administered A antimicrobial bonded/coated triple lumen catheter was placed in the right internal jugular vein using the Seldinger technique.  Evaluation Blood flow good Complications: No apparent complications Patient did tolerate procedure well. Chest X-ray ordered to verify placement.  CXR: normal.  [name] MD 06/09/2015, 1:53 PM

## 2015-06-09 NOTE — Progress Notes (Signed)
  Echocardiogram 2D Echocardiogram has been performed.  Katie Fuentes 06/09/2015, 11:06 AM

## 2015-06-10 ENCOUNTER — Inpatient Hospital Stay (HOSPITAL_COMMUNITY): Payer: Medicare Other

## 2015-06-10 ENCOUNTER — Encounter (HOSPITAL_COMMUNITY): Payer: Medicare Other

## 2015-06-10 ENCOUNTER — Other Ambulatory Visit (HOSPITAL_COMMUNITY): Payer: Medicare Other

## 2015-06-10 DIAGNOSIS — J9601 Acute respiratory failure with hypoxia: Secondary | ICD-10-CM

## 2015-06-10 DIAGNOSIS — I5041 Acute combined systolic (congestive) and diastolic (congestive) heart failure: Secondary | ICD-10-CM

## 2015-06-10 LAB — POCT I-STAT 3, ART BLOOD GAS (G3+)
ACID-BASE DEFICIT: 11 mmol/L — AB (ref 0.0–2.0)
ACID-BASE DEFICIT: 12 mmol/L — AB (ref 0.0–2.0)
Acid-base deficit: 11 mmol/L — ABNORMAL HIGH (ref 0.0–2.0)
Acid-base deficit: 11 mmol/L — ABNORMAL HIGH (ref 0.0–2.0)
BICARBONATE: 14.6 meq/L — AB (ref 20.0–24.0)
Bicarbonate: 12.5 mEq/L — ABNORMAL LOW (ref 20.0–24.0)
Bicarbonate: 14.4 mEq/L — ABNORMAL LOW (ref 20.0–24.0)
Bicarbonate: 14.6 mEq/L — ABNORMAL LOW (ref 20.0–24.0)
O2 SAT: 99 %
O2 Saturation: 96 %
O2 Saturation: 96 %
O2 Saturation: 98 %
PCO2 ART: 24.1 mmHg — AB (ref 35.0–45.0)
PCO2 ART: 29.7 mmHg — AB (ref 35.0–45.0)
PCO2 ART: 31.9 mmHg — AB (ref 35.0–45.0)
PH ART: 7.292 — AB (ref 7.350–7.450)
PO2 ART: 101 mmHg — AB (ref 80.0–100.0)
PO2 ART: 89 mmHg (ref 80.0–100.0)
Patient temperature: 97.7
Patient temperature: 99.7
TCO2: 13 mmol/L (ref 0–100)
TCO2: 15 mmol/L (ref 0–100)
TCO2: 16 mmol/L (ref 0–100)
TCO2: 16 mmol/L (ref 0–100)
pCO2 arterial: 33.6 mmHg — ABNORMAL LOW (ref 35.0–45.0)
pH, Arterial: 7.268 — ABNORMAL LOW (ref 7.350–7.450)
pH, Arterial: 7.249 — ABNORMAL LOW (ref 7.350–7.450)
pH, Arterial: 7.321 — ABNORMAL LOW (ref 7.350–7.450)
pO2, Arterial: 153 mmHg — ABNORMAL HIGH (ref 80.0–100.0)
pO2, Arterial: 108 mmHg — ABNORMAL HIGH (ref 80.0–100.0)

## 2015-06-10 LAB — RENAL FUNCTION PANEL
Albumin: 2.2 g/dL — ABNORMAL LOW (ref 3.5–5.0)
Anion gap: 17 — ABNORMAL HIGH (ref 5–15)
BUN: 121 mg/dL — AB (ref 6–20)
CHLORIDE: 96 mmol/L — AB (ref 101–111)
CO2: 12 mmol/L — AB (ref 22–32)
CREATININE: 4.53 mg/dL — AB (ref 0.44–1.00)
Calcium: 7.4 mg/dL — ABNORMAL LOW (ref 8.9–10.3)
GFR calc non Af Amer: 8 mL/min — ABNORMAL LOW (ref >60)
GFR, EST AFRICAN AMERICAN: 10 mL/min — AB (ref >60)
GLUCOSE: 207 mg/dL — AB (ref 65–99)
Phosphorus: 6.3 mg/dL — ABNORMAL HIGH (ref 2.5–4.6)
Potassium: 5 mmol/L (ref 3.5–5.1)
SODIUM: 125 mmol/L — AB (ref 135–145)

## 2015-06-10 LAB — CARBOXYHEMOGLOBIN
Carboxyhemoglobin: 0.6 % (ref 0.5–1.5)
METHEMOGLOBIN: 0.9 % (ref 0.0–1.5)
O2 Saturation: 66.7 %
Total hemoglobin: 9.1 g/dL — ABNORMAL LOW (ref 12.0–16.0)

## 2015-06-10 LAB — CBC
HCT: 26.1 % — ABNORMAL LOW (ref 36.0–46.0)
Hemoglobin: 8.9 g/dL — ABNORMAL LOW (ref 12.0–15.0)
MCH: 29 pg (ref 26.0–34.0)
MCHC: 34.1 g/dL (ref 30.0–36.0)
MCV: 85 fL (ref 78.0–100.0)
Platelets: 269 10*3/uL (ref 150–400)
RBC: 3.07 MIL/uL — ABNORMAL LOW (ref 3.87–5.11)
RDW: 15.6 % — ABNORMAL HIGH (ref 11.5–15.5)
WBC: 16.1 10*3/uL — AB (ref 4.0–10.5)

## 2015-06-10 LAB — LACTIC ACID, PLASMA
Lactic Acid, Venous: 1.9 mmol/L (ref 0.5–2.0)
Lactic Acid, Venous: 1.5 mmol/L (ref 0.5–2.0)

## 2015-06-10 LAB — BASIC METABOLIC PANEL
Anion gap: 14 (ref 5–15)
BUN: 112 mg/dL — AB (ref 6–20)
CALCIUM: 7.5 mg/dL — AB (ref 8.9–10.3)
CHLORIDE: 98 mmol/L — AB (ref 101–111)
CO2: 16 mmol/L — AB (ref 22–32)
Creatinine, Ser: 4.02 mg/dL — ABNORMAL HIGH (ref 0.44–1.00)
GFR calc Af Amer: 11 mL/min — ABNORMAL LOW (ref >60)
GFR calc non Af Amer: 10 mL/min — ABNORMAL LOW (ref >60)
GLUCOSE: 279 mg/dL — AB (ref 65–99)
Potassium: 5.4 mmol/L — ABNORMAL HIGH (ref 3.5–5.1)
SODIUM: 128 mmol/L — AB (ref 135–145)

## 2015-06-10 LAB — IRON AND TIBC
IRON: 123 ug/dL (ref 28–170)
Saturation Ratios: 74 % — ABNORMAL HIGH (ref 10.4–31.8)
TIBC: 165 ug/dL — AB (ref 250–450)
UIBC: 42 ug/dL

## 2015-06-10 LAB — MAGNESIUM: Magnesium: 2.1 mg/dL (ref 1.7–2.4)

## 2015-06-10 LAB — GLUCOSE, CAPILLARY
Glucose-Capillary: 317 mg/dL — ABNORMAL HIGH (ref 65–99)
Glucose-Capillary: 263 mg/dL — ABNORMAL HIGH (ref 65–99)
Glucose-Capillary: 266 mg/dL — ABNORMAL HIGH (ref 65–99)
Glucose-Capillary: 207 mg/dL — ABNORMAL HIGH (ref 65–99)
Glucose-Capillary: 187 mg/dL — ABNORMAL HIGH (ref 65–99)

## 2015-06-10 LAB — CREATININE, URINE, RANDOM: CREATININE, URINE: 24.38 mg/dL

## 2015-06-10 LAB — SODIUM, URINE, RANDOM: SODIUM UR: 84 mmol/L

## 2015-06-10 MED ORDER — AMIODARONE IV BOLUS ONLY 150 MG/100ML
150.0000 mg | Freq: Once | INTRAVENOUS | Status: AC
  Administered 2015-06-10: 150 mg via INTRAVENOUS
  Filled 2015-06-10: qty 100

## 2015-06-10 MED ORDER — HEPARIN (PORCINE) 2000 UNITS/L FOR CRRT
INTRAVENOUS_CENTRAL | Status: DC | PRN
  Administered 2015-06-10: 22:00:00 via INTRAVENOUS_CENTRAL
  Filled 2015-06-10 (×2): qty 1000

## 2015-06-10 MED ORDER — ETOMIDATE 2 MG/ML IV SOLN
INTRAVENOUS | Status: AC
  Administered 2015-06-10: 20 mg
  Filled 2015-06-10: qty 20

## 2015-06-10 MED ORDER — ROCURONIUM BROMIDE 50 MG/5ML IV SOLN
INTRAVENOUS | Status: AC
  Administered 2015-06-10: 50 mg
  Filled 2015-06-10: qty 2

## 2015-06-10 MED ORDER — PRO-STAT SUGAR FREE PO LIQD
30.0000 mL | Freq: Every day | ORAL | Status: DC
  Administered 2015-06-11 – 2015-06-12 (×3): 30 mL
  Filled 2015-06-10 (×3): qty 30

## 2015-06-10 MED ORDER — HEPARIN BOLUS VIA INFUSION (CRRT)
1000.0000 [IU] | INTRAVENOUS | Status: DC | PRN
  Administered 2015-06-10 – 2015-06-11 (×4): 1000 [IU] via INTRAVENOUS_CENTRAL
  Filled 2015-06-10 (×5): qty 1000

## 2015-06-10 MED ORDER — VITAL AF 1.2 CAL PO LIQD
1000.0000 mL | ORAL | Status: DC
  Administered 2015-06-11: 25 mL
  Filled 2015-06-10 (×5): qty 1000

## 2015-06-10 MED ORDER — SODIUM CHLORIDE 0.9 % IJ SOLN
10.0000 mL | Freq: Two times a day (BID) | INTRAMUSCULAR | Status: DC
  Administered 2015-06-10 – 2015-06-12 (×4): 10 mL

## 2015-06-10 MED ORDER — FUROSEMIDE 10 MG/ML IJ SOLN
160.0000 mg | Freq: Two times a day (BID) | INTRAVENOUS | Status: DC
  Administered 2015-06-10: 160 mg via INTRAVENOUS
  Filled 2015-06-10 (×2): qty 16

## 2015-06-10 MED ORDER — CHLORHEXIDINE GLUCONATE 0.12 % MT SOLN
15.0000 mL | Freq: Two times a day (BID) | OROMUCOSAL | Status: DC
Start: 2015-06-10 — End: 2015-06-13
  Administered 2015-06-10 – 2015-06-12 (×4): 15 mL via OROMUCOSAL
  Filled 2015-06-10 (×3): qty 15

## 2015-06-10 MED ORDER — SODIUM CHLORIDE 0.9 % IJ SOLN: 10.0000 mL | INTRAMUSCULAR | Status: DC | PRN

## 2015-06-10 MED ORDER — ASPIRIN 81 MG PO CHEW
81.0000 mg | CHEWABLE_TABLET | Freq: Every day | ORAL | Status: DC
  Administered 2015-06-10 – 2015-06-12 (×3): 81 mg via ORAL
  Filled 2015-06-10 (×2): qty 1

## 2015-06-10 MED ORDER — BIOTENE DRY MOUTH MT LIQD: 15.0000 mL | Freq: Four times a day (QID) | OROMUCOSAL | Status: DC

## 2015-06-10 MED ORDER — FENTANYL CITRATE (PF) 100 MCG/2ML IJ SOLN
INTRAMUSCULAR | Status: AC
  Administered 2015-06-10: 50 ug
  Filled 2015-06-10: qty 2

## 2015-06-10 MED ORDER — PRISMASOL BGK 4/2.5 32-4-2.5 MEQ/L IV SOLN
INTRAVENOUS | Status: DC
  Administered 2015-06-10 – 2015-06-12 (×14): via INTRAVENOUS_CENTRAL
  Filled 2015-06-10 (×19): qty 5000

## 2015-06-10 MED ORDER — CHLORHEXIDINE GLUCONATE 0.12 % MT SOLN
15.0000 mL | Freq: Two times a day (BID) | OROMUCOSAL | Status: DC
  Administered 2015-06-10: 15 mL via OROMUCOSAL

## 2015-06-10 MED ORDER — CETYLPYRIDINIUM CHLORIDE 0.05 % MT LIQD: 7.0000 mL | Freq: Four times a day (QID) | OROMUCOSAL | Status: DC

## 2015-06-10 MED ORDER — SODIUM CHLORIDE 0.9 % IJ SOLN
250.0000 [IU]/h | INTRAMUSCULAR | Status: DC
  Administered 2015-06-10: 250 [IU]/h via INTRAVENOUS_CENTRAL
  Administered 2015-06-11: 1800 [IU]/h via INTRAVENOUS_CENTRAL
  Administered 2015-06-11: 1250 [IU]/h via INTRAVENOUS_CENTRAL
  Filled 2015-06-10 (×6): qty 2

## 2015-06-10 MED ORDER — FENTANYL CITRATE (PF) 100 MCG/2ML IJ SOLN
25.0000 ug | INTRAMUSCULAR | Status: DC | PRN
  Administered 2015-06-10: 100 ug via INTRAVENOUS
  Administered 2015-06-11: 50 ug via INTRAVENOUS
  Administered 2015-06-11: 100 ug via INTRAVENOUS
  Administered 2015-06-12 (×4): 50 ug via INTRAVENOUS
  Administered 2015-06-12: 25 ug via INTRAVENOUS
  Filled 2015-06-10 (×8): qty 2

## 2015-06-10 MED ORDER — VITAL HIGH PROTEIN PO LIQD
1000.0000 mL | ORAL | Status: DC
Start: 2015-06-10 — End: 2015-06-10

## 2015-06-10 MED ORDER — MIDAZOLAM HCL 2 MG/2ML IJ SOLN
1.0000 mg | INTRAMUSCULAR | Status: DC | PRN
  Administered 2015-06-10: 2 mg via INTRAVENOUS
  Filled 2015-06-10: qty 2

## 2015-06-10 MED ORDER — HEPARIN SODIUM (PORCINE) 1000 UNIT/ML DIALYSIS
1000.0000 [IU] | INTRAMUSCULAR | Status: DC | PRN
  Administered 2015-06-10: 4000 [IU] via INTRAVENOUS_CENTRAL
  Filled 2015-06-10 (×4): qty 6

## 2015-06-10 MED ORDER — LIDOCAINE HCL (CARDIAC) 20 MG/ML IV SOLN
INTRAVENOUS | Status: AC
  Filled 2015-06-10: qty 5

## 2015-06-10 MED ORDER — INSULIN ASPART 100 UNIT/ML ~~LOC~~ SOLN
0.0000 [IU] | SUBCUTANEOUS | Status: DC
  Administered 2015-06-10: 5 [IU] via SUBCUTANEOUS

## 2015-06-10 MED ORDER — CHLORHEXIDINE GLUCONATE 0.12 % MT SOLN
OROMUCOSAL | Status: AC
  Filled 2015-06-10: qty 15

## 2015-06-10 MED ORDER — VANCOMYCIN HCL IN DEXTROSE 750-5 MG/150ML-% IV SOLN
750.0000 mg | INTRAVENOUS | Status: DC
  Administered 2015-06-10 – 2015-06-11 (×2): 750 mg via INTRAVENOUS
  Filled 2015-06-10 (×3): qty 150

## 2015-06-10 MED ORDER — SODIUM CHLORIDE 0.9 % IV BOLUS (SEPSIS)
1000.0000 mL | Freq: Once | INTRAVENOUS | Status: AC
  Administered 2015-06-10: 1000 mL via INTRAVENOUS

## 2015-06-10 MED ORDER — PRISMASOL BGK 4/2.5 32-4-2.5 MEQ/L IV SOLN
INTRAVENOUS | Status: DC
  Administered 2015-06-10 – 2015-06-12 (×5): via INTRAVENOUS_CENTRAL
  Filled 2015-06-10 (×8): qty 5000

## 2015-06-10 MED ORDER — VASOPRESSIN 20 UNIT/ML IV SOLN
0.0300 [IU]/min | INTRAVENOUS | Status: DC
  Administered 2015-06-10 – 2015-06-11 (×2): 0.03 [IU]/min via INTRAVENOUS
  Filled 2015-06-10 (×3): qty 2

## 2015-06-10 MED ORDER — CETYLPYRIDINIUM CHLORIDE 0.05 % MT LIQD
7.0000 mL | Freq: Four times a day (QID) | OROMUCOSAL | Status: DC
  Administered 2015-06-10 – 2015-06-12 (×10): 7 mL via OROMUCOSAL

## 2015-06-10 MED ORDER — MIDAZOLAM HCL 2 MG/2ML IJ SOLN
INTRAMUSCULAR | Status: AC
  Administered 2015-06-10: 1 mg
  Filled 2015-06-10: qty 2

## 2015-06-10 MED ORDER — ASPIRIN 81 MG PO CHEW
CHEWABLE_TABLET | ORAL | Status: AC
  Filled 2015-06-10: qty 1

## 2015-06-10 MED ORDER — INSULIN ASPART 100 UNIT/ML ~~LOC~~ SOLN
0.0000 [IU] | SUBCUTANEOUS | Status: DC
  Administered 2015-06-10: 7 [IU] via SUBCUTANEOUS
  Administered 2015-06-10: 3 [IU] via SUBCUTANEOUS
  Administered 2015-06-10: 11 [IU] via SUBCUTANEOUS
  Administered 2015-06-10: 4 [IU] via SUBCUTANEOUS
  Administered 2015-06-11 (×3): 3 [IU] via SUBCUTANEOUS
  Administered 2015-06-11: 4 [IU] via SUBCUTANEOUS
  Administered 2015-06-11: 3 [IU] via SUBCUTANEOUS
  Administered 2015-06-12 (×2): 1 [IU] via SUBCUTANEOUS

## 2015-06-10 MED ORDER — SUCCINYLCHOLINE CHLORIDE 20 MG/ML IJ SOLN
INTRAMUSCULAR | Status: AC
  Filled 2015-06-10: qty 1

## 2015-06-10 MED ORDER — PRISMASOL BGK 4/2.5 32-4-2.5 MEQ/L IV SOLN
INTRAVENOUS | Status: DC
  Administered 2015-06-10 – 2015-06-11 (×6): via INTRAVENOUS_CENTRAL
  Filled 2015-06-10 (×13): qty 5000

## 2015-06-10 NOTE — Progress Notes (Signed)
[facility] Progress Note Patient Name: Katie Fuentes DOB: 04-22-1934 MRN: [id number]   Date of Service  06/10/2015  HPI/Events of Note  Metabolic acidosis  eICU Interventions  Vent adjustments made to facilitate correction of pH     Intervention Category Major Interventions: Acid-Base disturbance - evaluation and management  Jacques Fife 06/10/2015, 6:43 AM

## 2015-06-10 NOTE — Procedures (Signed)
Arterial Catheter Insertion Procedure Note Katie Fuentes [id number] September 16, 1934  Procedure: Insertion of Arterial Catheter  Indications: Blood pressure monitoring and Frequent blood sampling  Procedure Details Consent: Risks of procedure as well as the alternatives and risks of each were explained to the (patient/caregiver).  Consent for procedure obtained. Time Out: Verified patient identification, verified procedure, site/side was marked, verified correct patient position, special equipment/implants available, medications/allergies/relevent history reviewed, required imaging and test results available.  Performed  Maximum sterile technique was used including antiseptics, cap, gloves, hand hygiene and mask. Skin prep: Chlorhexidine; local anesthetic administered 22 gauge catheter was inserted into right radial artery using the Seldinger technique.  Evaluation Blood flow good; BP tracing good. Complications: No apparent complications.   Ravinder Lukehart, Marjory Lies 06/10/2015

## 2015-06-10 NOTE — Progress Notes (Signed)
[facility] for Vancomycin + Zosyn Indication: sepsis  Allergies  Allergen Reactions  . Cefdinir Diarrhea  . Macrobid [Nitrofurantoin Macrocrystal] Nausea And Vomiting and Other (See Comments)    Fever, Chills  . Sulfa Antibiotics Nausea And Vomiting and Other (See Comments)    Fever,chills  . Alphagan [Brimonidine] Other (See Comments)    Redness in the eyes  . Azopt [Brinzolamide]     Redness in the eyes    Patient Measurements: Height: 5\' 2"  (157.5 cm) Weight: 162 lb 4.1 oz (73.6 kg) IBW/kg (Calculated) : 50.1  Vital Signs: Temp: 100.8 F (38.2 C) (07/18 1200) Temp Source: Oral (07/18 1200) BP: 125/58 mmHg (07/18 0822) Pulse Rate: 107 (07/18 1400) Intake/Output from previous day: 07/17 0701 - 07/18 0700 In: 2480.4 [P.O.:450; I.V.:618.4; IV Piggyback:1412] Out: 565 [Urine:565] Intake/Output from this shift: Total I/O In: 407.4 [I.V.:191.4; NG/GT:100; IV Piggyback:116] Out: 240 [Urine:240]  Labs:  Recent Labs  06/07/2015 1604 06/19/2015 1616 06/09/15 0709 06/09/15 2315 06/10/15 0420  WBC 11.3*  --  12.5*  --  16.1*  HGB 8.3* 8.5* 8.1*  --  8.9*  PLT 227  --  240  --  269  CREATININE 2.85* 2.70* 3.30* 4.00* 4.02*   Estimated Creatinine Clearance: 10.7 mL/min (by C-G formula based on Cr of 4.02). No results for input(s): VANCOTROUGH, VANCOPEAK, VANCORANDOM, GENTTROUGH, GENTPEAK, GENTRANDOM, TOBRATROUGH, TOBRAPEAK, TOBRARND, AMIKACINPEAK, AMIKACINTROU, AMIKACIN in the last 72 hours.   Microbiology: Recent Results (from the past 720 hour(s))  MRSA PCR Screening     Status: None   Collection Time: 06/09/15 11:51 AM  Result Value Ref Range Status   MRSA by PCR NEGATIVE NEGATIVE Final    Comment:        The GeneXpert MRSA Assay (FDA approved for NASAL specimens only), is one component of a comprehensive MRSA colonization surveillance program. It is not intended to diagnose MRSA infection nor to guide or monitor treatment  for MRSA infections.   Culture, blood (routine x 2)     Status: None (Preliminary result)   Collection Time: 06/09/15  3:01 PM  Result Value Ref Range Status   Specimen Description BLOOD CENTRAL LINE  Final   Special Requests BOTTLES DRAWN AEROBIC AND ANAEROBIC 10CC  Final   Culture NO GROWTH < 24 HOURS  Final   Report Status PENDING  Incomplete  Culture, blood (routine x 2)     Status: None (Preliminary result)   Collection Time: 06/09/15  3:15 PM  Result Value Ref Range Status   Specimen Description BLOOD BLOOD LEFT ARM  Final   Special Requests BOTTLES DRAWN AEROBIC AND ANAEROBIC 10CC  Final   Culture NO GROWTH < 24 HOURS  Final   Report Status PENDING  Incomplete   Assessment: 81 yo f admitted on 7/16 for nausea, vomiting, and a brief episode of passing out.  Pharmacy is consulted to dose vancomycin and Zosyn for sepsis.  Patient has recently been hospitalized in March and April of this year.  Patient has been receiving clindamycin as an outpatient for a dental abscess.    Patient also has a PMH of stage IV CKD, and SCr has been worsening during hospitalization. She is now to start CRRT.  WBC 16.1, tmax/24h 100.8.  Vancomycin 7/17 >> Zosyn 7/17 >>  MRSA PCR neg 7/17 BCx: NGTD  Goal of Therapy:  Vancomycin trough level 15-20 mcg/ml  Plan:  -continue Zosyn 2.25g IV q6h -change vancomycin to 750mg  IV q24h for CRRT dosing. Will  start at 1600 today as this is 24h from last dose -follow CRRT tolerance, c/s, LOT, levels PRN  Tiffini Blacksher D. Loman Logan, PharmD, BCPS Clinical Pharmacist Pager: [phone] 06/10/2015 3:27 PM

## 2015-06-10 NOTE — Care Management Important Message (Signed)
Important Message  Patient Details  Name: Katie Fuentes MRN: [id number] Date of Birth: May 27, 1934   Medicare Important Message Given:  Yes-second notification given    Pricilla Handler 06/10/2015, 2:27 PM

## 2015-06-10 NOTE — Progress Notes (Signed)
Pharmacist Heart Failure Core Measure Documentation  Assessment: Katie Fuentes has an EF documented as 20% on 7/16 by ECHO  Rationale: Heart failure patients with left ventricular systolic dysfunction (LVSD) and an EF < 40% should be prescribed an angiotensin converting enzyme inhibitor (ACEI) or angiotensin receptor blocker (ARB) at discharge unless a contraindication is documented in the medical record.  This patient is not currently on an ACEI or ARB for HF.  This note is being placed in the record in order to provide documentation that a contraindication to the use of these agents is present for this encounter.  ACE Inhibitor or Angiotensin Receptor Blocker is contraindicated (specify all that apply)  []   ACEI allergy AND ARB allergy []   Angioedema []   Moderate or severe aortic stenosis []   Hyperkalemia []   Hypotension []   Renal artery stenosis [x]   Worsening renal function, preexisting renal disease or dysfunction   Bonnita Nasuti Pharm.D. CPP, BCPS Clinical Pharmacist [phone] 06/10/2015 12:21 PM

## 2015-06-10 NOTE — Procedures (Signed)
Endotracheal Intubation Procedure Note  Indication for endotracheal intubation: impending respiratory failure. Airway Assessment: Mallampati Class: III (soft and hard palate and base of uvula visible). Sedation: etomidate. Paralytic: rocuronium. Lidocaine: no. Atropine: no. Equipment: Macintosh 3 laryngoscope blade. Cricoid Pressure: no. Number of attempts: 1. ETT location confirmed by by auscultation, by CXR and ETCO2 monitor.  Katie Fuentes, Marjory Lies 06/10/2015

## 2015-06-10 NOTE — Consult Note (Signed)
Reason for Consult: CRRT Referring Physician: Dr. Manley Mason Katie Fuentes is an 81 y.o. female.  HPI: Pt is a 81 year old female w/ PMHx of HTN, DM2, HLD, HTN, CHF, RAS, and CKD stage 4 who presented weakness and AMS in the setting of a dental abscess. Pt is intubated and thus history obtained per chart review and husband. Pt was to have lower molar extracted which was held off until she could see a cardiologist regarding plavix. Meanwhile, pt presented to the ED for weakness and AMS and was found to be hypotensive with an AKI. She was aggressively treated with IVFs as dehydration was thought to be the cause of her symptoms. However, pt became volume overloaded, ECHO on 7/17 revealed EF of 20% down from EF of 30-35% in 02/16/2015, and creatinine continued to rise from 2.85 on admission to 4.02 today. Nephrology was consulted for CRRT.   Primary Nephrologist Dr Jamal Maes.    Past Medical History  Diagnosis Date  . Atrophic vaginitis   . Yeast infection   . Glaucoma   . Cataracts, bilateral   . Type 2 diabetes, controlled, with renal manifestation 01/2000  . Hyperlipidemia   . Hypertension   . Congestive heart failure   . Cardiomyopathy   . LBBB (left bundle branch block)   . Chronic renal disease, stage III   . Arthritis     Past Surgical History  Procedure Laterality Date  . Vaginal delivery      x3  . Colonoscopy  05/2003  . Finger surgery    . Cataract extraction w/ intraocular lens implant Right 2011    1 year later the left side done  . Colonoscopy  06/2008    Dr. Lajoyce Corners recheck in 5 years  . Total vaginal hysterectomy  1978    for endometriosis  . Appendectomy  1950  . Squamous cell carcinoma excision  6/15    Nose  . Left and right heart catheterization with coronary angiogram N/A 03/18/2015    Procedure: LEFT AND RIGHT HEART CATHETERIZATION WITH CORONARY ANGIOGRAM;  Surgeon: Jolaine Artist, MD;  Location[facility] CATH LAB;  Service: Cardiovascular;  Laterality:  N/A;  . Peripheral vascular catheterization N/A 03/29/2015    Procedure: Renal Intervention;  Surgeon: Serafina Mitchell, MD;  Location: [place] CV LAB;  Service: Cardiovascular;  Laterality: N/A;  . Percutaneous stent intervention Left 03/29/2015    Procedure: Percutaneous Stent Intervention;  Surgeon: Serafina Mitchell, MD;  Location: [place] [facility] LAB;  Service: Cardiovascular;  Laterality: Left;  Renal  . Eye surgery    . Abdominal hysterectomy      Family History  Problem Relation Age of Onset  . Heart disease  67  . CAD Brother   . Hypertension Brother     Social History:  reports that she has quit smoking. Her smoking use included Cigarettes. She has a .2 pack-year smoking history. She has never used smokeless tobacco. She reports that she does not drink alcohol or use illicit drugs.  Allergies:  Allergies  Allergen Reactions  . Cefdinir Diarrhea  . Macrobid [Nitrofurantoin Macrocrystal] Nausea And Vomiting and Other (See Comments)    Fever, Chills  . Sulfa Antibiotics Nausea And Vomiting and Other (See Comments)    Fever,chills  . Alphagan [Brimonidine] Other (See Comments)    Redness in the eyes  . Azopt [Brinzolamide]     Redness in the eyes    Medications:  Scheduled: . antiseptic oral rinse  7  mL Mouth Rinse QID  . aspirin  81 mg Oral Daily  . atorvastatin  10 mg Oral QPM  . chlorhexidine  15 mL Mouth Rinse BID  . clopidogrel  75 mg Oral Daily  . furosemide  160 mg Intravenous Q12H  . heparin  5,000 Units Subcutaneous 3 times per day  . insulin aspart  0-20 Units Subcutaneous 6 times per day  . insulin aspart  0-5 Units Subcutaneous QHS  . lidocaine (cardiac) 100 mg/15ml      . pantoprazole (PROTONIX) IV  40 mg Intravenous Q24H  . piperacillin-tazobactam (ZOSYN)  IV  2.25 g Intravenous 4 times per day  . saccharomyces boulardii  250 mg Oral QHS  . sodium chloride  10-40 mL Intracatheter Q12H  . sodium chloride  3 mL Intravenous Q12H  . succinylcholine      .  vancomycin  1,000 mg Intravenous Q48H     Results for orders placed or performed during the hospital encounter of 06/07/2015 (from the past 48 hour(s))  Comprehensive metabolic panel     Status: Abnormal   Collection Time: 06/23/2015  4:04 PM  Result Value Ref Range   Sodium 128 (L) 135 - 145 mmol/L   Potassium 4.9 3.5 - 5.1 mmol/L   Chloride 101 101 - 111 mmol/L   CO2 18 (L) 22 - 32 mmol/L   Glucose, Bld 294 (H) 65 - 99 mg/dL   BUN 89 (H) 6 - 20 mg/dL   Creatinine, Ser 2.85 (H) 0.44 - 1.00 mg/dL   Calcium 8.1 (L) 8.9 - 10.3 mg/dL   Total Protein 5.8 (L) 6.5 - 8.1 g/dL   Albumin 2.6 (L) 3.5 - 5.0 g/dL   AST 36 15 - 41 U/L   ALT 35 14 - 54 U/L   Alkaline Phosphatase 35 (L) 38 - 126 U/L   Total Bilirubin 0.4 0.3 - 1.2 mg/dL   GFR calc non Af Amer 15 (L) >60 mL/min   GFR calc Af Amer 17 (L) >60 mL/min    Comment: (NOTE) The eGFR has been calculated using the CKD EPI equation. This calculation has not been validated in all clinical situations. eGFR's persistently <60 mL/min signify possible Chronic Kidney Disease.    Anion gap 9 5 - 15  CBC     Status: Abnormal   Collection Time: 05/26/2015  4:04 PM  Result Value Ref Range   WBC 11.3 (H) 4.0 - 10.5 K/uL   RBC 2.75 (L) 3.87 - 5.11 MIL/uL   Hemoglobin 8.3 (L) 12.0 - 15.0 g/dL   HCT 24.1 (L) 36.0 - 46.0 %   MCV 87.6 78.0 - 100.0 fL   MCH 30.2 26.0 - 34.0 pg   MCHC 34.4 30.0 - 36.0 g/dL   RDW 15.7 (H) 11.5 - 15.5 %   Platelets 227 150 - 400 K/uL  Troponin I     Status: Abnormal   Collection Time: 06/06/2015  4:14 PM  Result Value Ref Range   Troponin I 0.53 (HH) <0.031 ng/mL    Comment:        POSSIBLE MYOCARDIAL ISCHEMIA. SERIAL TESTING RECOMMENDED. REPEATED TO VERIFY CRITICAL RESULT CALLED TO, READ BACK BY AND VERIFIED WITH: BAST,T RN 06/11/2015 1816 WOOTEN,K   I-stat chem 8, ed     Status: Abnormal   Collection Time: 06/22/2015  4:16 PM  Result Value Ref Range   Sodium 128 (L) 135 - 145 mmol/L   Potassium 4.8 3.5 - 5.1 mmol/L    Chloride 99 (L) 101 -  111 mmol/L   BUN 94 (H) 6 - 20 mg/dL   Creatinine, Ser 2.70 (H) 0.44 - 1.00 mg/dL   Glucose, Bld 289 (H) 65 - 99 mg/dL   Calcium, Ion 1.05 (L) 1.13 - 1.30 mmol/L   TCO2 17 0 - 100 mmol/L   Hemoglobin 8.5 (L) 12.0 - 15.0 g/dL   HCT 25.0 (L) 36.0 - 46.0 %  I-Stat venous blood gas, ED     Status: Abnormal   Collection Time: 06/03/2015  5:18 PM  Result Value Ref Range   pH, Ven 7.341 (H) 7.250 - 7.300   pCO2, Ven 33.9 (L) 45.0 - 50.0 mmHg   pO2, Ven 18.0 (LL) 30.0 - 45.0 mmHg   Bicarbonate 18.4 (L) 20.0 - 24.0 mEq/L   TCO2 19 0 - 100 mmol/L   O2 Saturation 26.0 %   Acid-base deficit 7.0 (H) 0.0 - 2.0 mmol/L   Sample type VENOUS    Comment NOTIFIED PHYSICIAN   Troponin I (q 6hr x 3)     Status: Abnormal   Collection Time: 06/01/2015  8:41 PM  Result Value Ref Range   Troponin I 0.72 (HH) <0.031 ng/mL    Comment:        POSSIBLE MYOCARDIAL ISCHEMIA. SERIAL TESTING RECOMMENDED. CRITICAL VALUE NOTED.  VALUE IS CONSISTENT WITH PREVIOUSLY REPORTED AND CALLED VALUE. REPEATED TO VERIFY   Glucose, capillary     Status: Abnormal   Collection Time: 06/14/2015 10:24 PM  Result Value Ref Range   Glucose-Capillary 206 (H) 65 - 99 mg/dL  Troponin I (q 6hr x 3)     Status: Abnormal   Collection Time: 06/09/15 12:49 AM  Result Value Ref Range   Troponin I 0.95 (HH) <0.031 ng/mL    Comment:        POSSIBLE MYOCARDIAL ISCHEMIA. SERIAL TESTING RECOMMENDED. CRITICAL VALUE NOTED.  VALUE IS CONSISTENT WITH PREVIOUSLY REPORTED AND CALLED VALUE. REPEATED TO VERIFY   Urinalysis, Routine w reflex microscopic (not at [facility])     Status: Abnormal   Collection Time: 06/09/15  2:38 AM  Result Value Ref Range   Color, Urine YELLOW YELLOW   APPearance CLOUDY (A) CLEAR   Specific Gravity, Urine 1.015 1.005 - 1.030   pH 5.0 5.0 - 8.0   Glucose, UA NEGATIVE NEGATIVE mg/dL   Hgb urine dipstick NEGATIVE NEGATIVE   Bilirubin Urine NEGATIVE NEGATIVE   Ketones, ur NEGATIVE NEGATIVE mg/dL    Protein, ur NEGATIVE NEGATIVE mg/dL   Urobilinogen, UA 0.2 0.0 - 1.0 mg/dL   Nitrite NEGATIVE NEGATIVE   Leukocytes, UA NEGATIVE NEGATIVE    Comment: MICROSCOPIC NOT DONE ON URINES WITH NEGATIVE PROTEIN, BLOOD, LEUKOCYTES, NITRITE, OR GLUCOSE <1000 mg/dL.  Glucose, capillary     Status: Abnormal   Collection Time: 06/09/15  6:06 AM  Result Value Ref Range   Glucose-Capillary 199 (H) 65 - 99 mg/dL  Troponin I (q 6hr x 3)     Status: Abnormal   Collection Time: 06/09/15  7:09 AM  Result Value Ref Range   Troponin I 1.93 (HH) <0.031 ng/mL    Comment:        POSSIBLE MYOCARDIAL ISCHEMIA. SERIAL TESTING RECOMMENDED. REPEATED TO VERIFY CRITICAL VALUE NOTED.  VALUE IS CONSISTENT WITH PREVIOUSLY REPORTED AND CALLED VALUE.   Comprehensive metabolic panel     Status: Abnormal   Collection Time: 06/09/15  7:09 AM  Result Value Ref Range   Sodium 130 (L) 135 - 145 mmol/L   Potassium 4.8 3.5 - 5.1 mmol/L  Chloride 101 101 - 111 mmol/L   CO2 18 (L) 22 - 32 mmol/L   Glucose, Bld 197 (H) 65 - 99 mg/dL   BUN 94 (H) 6 - 20 mg/dL   Creatinine, Ser 3.30 (H) 0.44 - 1.00 mg/dL   Calcium 8.1 (L) 8.9 - 10.3 mg/dL   Total Protein 5.7 (L) 6.5 - 8.1 g/dL   Albumin 2.4 (L) 3.5 - 5.0 g/dL   AST 54 (H) 15 - 41 U/L   ALT 49 14 - 54 U/L   Alkaline Phosphatase 40 38 - 126 U/L   Total Bilirubin 0.6 0.3 - 1.2 mg/dL   GFR calc non Af Amer 12 (L) >60 mL/min   GFR calc Af Amer 14 (L) >60 mL/min    Comment: (NOTE) The eGFR has been calculated using the CKD EPI equation. This calculation has not been validated in all clinical situations. eGFR's persistently <60 mL/min signify possible Chronic Kidney Disease.    Anion gap 11 5 - 15  CBC     Status: Abnormal   Collection Time: 06/09/15  7:09 AM  Result Value Ref Range   WBC 12.5 (H) 4.0 - 10.5 K/uL   RBC 2.79 (L) 3.87 - 5.11 MIL/uL   Hemoglobin 8.1 (L) 12.0 - 15.0 g/dL   HCT 24.2 (L) 36.0 - 46.0 %   MCV 86.7 78.0 - 100.0 fL   MCH 29.0 26.0 - 34.0 pg    MCHC 33.5 30.0 - 36.0 g/dL   RDW 15.8 (H) 11.5 - 15.5 %   Platelets 240 150 - 400 K/uL  MRSA PCR Screening     Status: None   Collection Time: 06/09/15 11:51 AM  Result Value Ref Range   MRSA by PCR NEGATIVE NEGATIVE    Comment:        The GeneXpert MRSA Assay (FDA approved for NASAL specimens only), is one component of a comprehensive MRSA colonization surveillance program. It is not intended to diagnose MRSA infection nor to guide or monitor treatment for MRSA infections.   Brain natriuretic peptide     Status: Abnormal   Collection Time: 06/09/15 11:55 AM  Result Value Ref Range   B Natriuretic Peptide 3120.5 (H) 0.0 - 100.0 pg/mL  I-STAT 3, arterial blood gas (G3+)     Status: Abnormal   Collection Time: 06/09/15 12:16 PM  Result Value Ref Range   pH, Arterial 7.243 (L) 7.350 - 7.450   pCO2 arterial 30.0 (L) 35.0 - 45.0 mmHg   pO2, Arterial 68.0 (L) 80.0 - 100.0 mmHg   Bicarbonate 13.0 (L) 20.0 - 24.0 mEq/L   TCO2 14 0 - 100 mmol/L   O2 Saturation 90.0 %   Acid-base deficit 13.0 (H) 0.0 - 2.0 mmol/L   Patient temperature 98.6 F    Collection site RADIAL, ALLEN'S TEST ACCEPTABLE    Drawn by Operator    Sample type ARTERIAL   Carboxyhemoglobin     Status: Abnormal   Collection Time: 06/09/15  1:05 PM  Result Value Ref Range   Total hemoglobin 11.2 (L) 12.0 - 16.0 g/dL   O2 Saturation 69.1 %   Carboxyhemoglobin 0.9 0.5 - 1.5 %   Methemoglobin 1.5 0.0 - 1.5 %  Lactic acid, plasma     Status: None   Collection Time: 06/09/15  2:40 PM  Result Value Ref Range   Lactic Acid, Venous 1.3 0.5 - 2.0 mmol/L  Type and screen     Status: None   Collection Time: 06/09/15  3:01 PM  Result Value Ref Range   ABO/RH(D) O POS    Antibody Screen NEG    Sample Expiration 05/31/2015   Urinalysis, Routine w reflex microscopic (not at [facility])     Status: Abnormal   Collection Time: 06/09/15  5:39 PM  Result Value Ref Range   Color, Urine YELLOW YELLOW   APPearance TURBID (A) CLEAR    Specific Gravity, Urine 1.012 1.005 - 1.030   pH 5.0 5.0 - 8.0   Glucose, UA NEGATIVE NEGATIVE mg/dL   Hgb urine dipstick SMALL (A) NEGATIVE   Bilirubin Urine NEGATIVE NEGATIVE   Ketones, ur NEGATIVE NEGATIVE mg/dL   Protein, ur 30 (A) NEGATIVE mg/dL   Urobilinogen, UA 0.2 0.0 - 1.0 mg/dL   Nitrite NEGATIVE NEGATIVE   Leukocytes, UA TRACE (A) NEGATIVE  Urine microscopic-add on     Status: Abnormal   Collection Time: 06/09/15  5:39 PM  Result Value Ref Range   Squamous Epithelial / LPF FEW (A) RARE   WBC, UA 0-2 <3 WBC/hpf   RBC / HPF 0-2 <3 RBC/hpf   Bacteria, UA FEW (A) RARE   Casts HYALINE CASTS (A) NEGATIVE    Comment: GRANULAR CAST   Urine-Other MANY YEAST     Comment: AMORPHOUS URATES/PHOSPHATES  Glucose, capillary     Status: Abnormal   Collection Time: 06/09/15  5:54 PM  Result Value Ref Range   Glucose-Capillary 336 (H) 65 - 99 mg/dL  Glucose, capillary     Status: Abnormal   Collection Time: 06/09/15 10:38 PM  Result Value Ref Range   Glucose-Capillary 317 (H) 65 - 99 mg/dL  Basic metabolic panel     Status: Abnormal   Collection Time: 06/09/15 11:15 PM  Result Value Ref Range   Sodium 125 (L) 135 - 145 mmol/L   Potassium 5.6 (H) 3.5 - 5.1 mmol/L   Chloride 96 (L) 101 - 111 mmol/L   CO2 16 (L) 22 - 32 mmol/L   Glucose, Bld 331 (H) 65 - 99 mg/dL   BUN 109 (H) 6 - 20 mg/dL   Creatinine, Ser 4.00 (H) 0.44 - 1.00 mg/dL   Calcium 7.7 (L) 8.9 - 10.3 mg/dL   GFR calc non Af Amer 10 (L) >60 mL/min   GFR calc Af Amer 11 (L) >60 mL/min    Comment: (NOTE) The eGFR has been calculated using the CKD EPI equation. This calculation has not been validated in all clinical situations. eGFR's persistently <60 mL/min signify possible Chronic Kidney Disease.    Anion gap 13 5 - 15  Carboxyhemoglobin     Status: Abnormal   Collection Time: 06/09/15 11:28 PM  Result Value Ref Range   Total hemoglobin 9.6 (L) 12.0 - 16.0 g/dL   O2 Saturation 57.7 %   Carboxyhemoglobin 0.9  0.5 - 1.5 %   Methemoglobin 1.4 0.0 - 1.5 %  I-STAT 3, arterial blood gas (G3+)     Status: Abnormal   Collection Time: 06/10/15 12:03 AM  Result Value Ref Range   pH, Arterial 7.292 (L) 7.350 - 7.450   pCO2 arterial 29.7 (L) 35.0 - 45.0 mmHg   pO2, Arterial 89.0 80.0 - 100.0 mmHg   Bicarbonate 14.4 (L) 20.0 - 24.0 mEq/L   TCO2 15 0 - 100 mmol/L   O2 Saturation 96.0 %   Acid-base deficit 11.0 (H) 0.0 - 2.0 mmol/L   Patient temperature 97.7 F    Collection site RADIAL, ALLEN'S TEST ACCEPTABLE    Drawn by RT    Sample  type ARTERIAL   Lactic acid, plasma     Status: None   Collection Time: 06/10/15 12:19 AM  Result Value Ref Range   Lactic Acid, Venous 1.9 0.5 - 2.0 mmol/L  Lactic acid, plasma     Status: None   Collection Time: 06/10/15  3:20 AM  Result Value Ref Range   Lactic Acid, Venous 1.5 0.5 - 2.0 mmol/L  I-STAT 3, arterial blood gas (G3+)     Status: Abnormal   Collection Time: 06/10/15  3:25 AM  Result Value Ref Range   pH, Arterial 7.268 (L) 7.350 - 7.450   pCO2 arterial 31.9 (L) 35.0 - 45.0 mmHg   pO2, Arterial 153.0 (H) 80.0 - 100.0 mmHg   Bicarbonate 14.6 (L) 20.0 - 24.0 mEq/L   TCO2 16 0 - 100 mmol/L   O2 Saturation 99.0 %   Acid-base deficit 11.0 (H) 0.0 - 2.0 mmol/L   Patient temperature 98.6 F    Collection site RADIAL, ALLEN'S TEST ACCEPTABLE    Drawn by RT    Sample type ARTERIAL   Basic metabolic panel     Status: Abnormal   Collection Time: 06/10/15  4:20 AM  Result Value Ref Range   Sodium 128 (L) 135 - 145 mmol/L   Potassium 5.4 (H) 3.5 - 5.1 mmol/L   Chloride 98 (L) 101 - 111 mmol/L   CO2 16 (L) 22 - 32 mmol/L   Glucose, Bld 279 (H) 65 - 99 mg/dL   BUN 112 (H) 6 - 20 mg/dL   Creatinine, Ser 4.02 (H) 0.44 - 1.00 mg/dL   Calcium 7.5 (L) 8.9 - 10.3 mg/dL   GFR calc non Af Amer 10 (L) >60 mL/min   GFR calc Af Amer 11 (L) >60 mL/min    Comment: (NOTE) The eGFR has been calculated using the CKD EPI equation. This calculation has not been validated  in all clinical situations. eGFR's persistently <60 mL/min signify possible Chronic Kidney Disease.    Anion gap 14 5 - 15  CBC     Status: Abnormal   Collection Time: 06/10/15  4:20 AM  Result Value Ref Range   WBC 16.1 (H) 4.0 - 10.5 K/uL   RBC 3.07 (L) 3.87 - 5.11 MIL/uL   Hemoglobin 8.9 (L) 12.0 - 15.0 g/dL   HCT 26.1 (L) 36.0 - 46.0 %   MCV 85.0 78.0 - 100.0 fL   MCH 29.0 26.0 - 34.0 pg   MCHC 34.1 30.0 - 36.0 g/dL   RDW 15.6 (H) 11.5 - 15.5 %   Platelets 269 150 - 400 K/uL  Iron and TIBC     Status: Abnormal   Collection Time: 06/10/15  4:20 AM  Result Value Ref Range   Iron 123 28 - 170 ug/dL   TIBC 165 (L) 250 - 450 ug/dL   Saturation Ratios 74 (H) 10.4 - 31.8 %   UIBC 42 ug/dL  Carboxyhemoglobin     Status: Abnormal   Collection Time: 06/10/15  4:25 AM  Result Value Ref Range   Total hemoglobin 9.1 (L) 12.0 - 16.0 g/dL   O2 Saturation 66.7 %   Carboxyhemoglobin 0.6 0.5 - 1.5 %   Methemoglobin 0.9 0.0 - 1.5 %  Glucose, capillary     Status: Abnormal   Collection Time: 06/10/15  5:00 AM  Result Value Ref Range   Glucose-Capillary 263 (H) 65 - 99 mg/dL   Comment 1 Arterial Specimen   I-STAT 3, arterial blood gas (G3+)  Status: Abnormal   Collection Time: 06/10/15  5:52 AM  Result Value Ref Range   pH, Arterial 7.249 (L) 7.350 - 7.450   pCO2 arterial 33.6 (L) 35.0 - 45.0 mmHg   pO2, Arterial 101.0 (H) 80.0 - 100.0 mmHg   Bicarbonate 14.6 (L) 20.0 - 24.0 mEq/L   TCO2 16 0 - 100 mmol/L   O2 Saturation 96.0 %   Acid-base deficit 11.0 (H) 0.0 - 2.0 mmol/L   Patient temperature 99.7 F    Sample type ARTERIAL   Glucose, capillary     Status: Abnormal   Collection Time: 06/10/15  7:39 AM  Result Value Ref Range   Glucose-Capillary 266 (H) 65 - 99 mg/dL  I-STAT 3, arterial blood gas (G3+)     Status: Abnormal   Collection Time: 06/10/15  8:20 AM  Result Value Ref Range   pH, Arterial 7.321 (L) 7.350 - 7.450   pCO2 arterial 24.1 (L) 35.0 - 45.0 mmHg   pO2,  Arterial 108.0 (H) 80.0 - 100.0 mmHg   Bicarbonate 12.5 (L) 20.0 - 24.0 mEq/L   TCO2 13 0 - 100 mmol/L   O2 Saturation 98.0 %   Acid-base deficit 12.0 (H) 0.0 - 2.0 mmol/L   Patient temperature 97.7 F    Collection site RADIAL, ALLEN'S TEST ACCEPTABLE    Drawn by Operator    Sample type ARTERIAL     Dg Abd 1 View  06/10/2015   CLINICAL DATA:  OG tube placement  EXAM: ABDOMEN - 1 VIEW  COMPARISON:  06/10/2015  FINDINGS: OG tube tip is in the mid stomach. Gallstones noted within the gallbladder. Nonobstructive bowel gas pattern.  IMPRESSION: OG tube tip in the mid stomach.   Electronically Signed   By: Rolm Baptise M.D.   On: 06/10/2015 10:43   Dg Chest Port 1 View  06/10/2015   CLINICAL DATA:  Endotracheal tube placement.  Initial encounter.  EXAM: PORTABLE CHEST - 1 VIEW  COMPARISON:  Chest radiograph performed 06/09/2015  FINDINGS: The patient's endotracheal tube is seen ending 5 cm above the carina. An enteric tube is noted extending below the diaphragm. A right IJ line is noted ending about the mid SVC.  Vascular congestion is noted. Left basilar airspace opacity is noted. Increased interstitial markings raise concern for mild pulmonary edema. A small left pleural effusion is suspected. No pneumothorax is identified.  The cardiomediastinal silhouette is normal in size. No acute osseous abnormalities are identified.  IMPRESSION: 1. Endotracheal tube seen ending 5 cm above the carina. 2. Vascular congestion noted. Increased interstitial markings raise concern for mild pulmonary edema. Small left pleural effusion suspected.   Electronically Signed   By: Garald Balding M.D.   On: 06/10/2015 04:52   Dg Chest Port 1 View  06/09/2015   CLINICAL DATA:  81 year old female status post central line placement  EXAM: PORTABLE CHEST - 1 VIEW  COMPARISON:  Prior chest x-ray 06/09/2015  FINDINGS: New right IJ approach central venous catheter. Catheter tip projects over the distal SVC. No evidence of  pneumothorax or hemothorax. Similar appearance of cardiac and mediastinal contours. There is prominence of the right mediastinum concerning for underlying mass or adenopathy. Slightly decreased interstitial edema. Persistent bibasilar atelectasis.  IMPRESSION: 1. The tip of the new right IJ approach central venous catheter projects over the distal SVC. No evidence of pneumothorax or other complication. 2. Improving interstitial edema. 3. Persistent bibasilar atelectasis. 4. Prominence of the right hilum concerning for underlying adenopathy or mass. Consider  further evaluation with non emergent CT scan of the chest with contrast.   Electronically Signed   By: Jacqulynn Cadet M.D.   On: 06/09/2015 13:47   Dg Chest Port 1 View  06/09/2015   CLINICAL DATA:  CHF.  Low blood pressure.  EXAM: PORTABLE CHEST - 1 VIEW  COMPARISON:  04/02/2015  FINDINGS: Lungs are hyperinflated. The heart is enlarged. There are perihilar changes of pulmonary edema. More confluent opacity at the left base may represent edema or infectious process. Suspect left pleural effusion.  IMPRESSION: 1. Changes consistent with pulmonary edema. 2. Left lower lobe infiltrate or confluent edema.   Electronically Signed   By: Nolon Nations M.D.   On: 06/09/2015 10:37   Dg Abd Portable 1v  06/10/2015   CLINICAL DATA:  Orogastric tube placement.  Initial encounter.  EXAM: PORTABLE ABDOMEN - 1 VIEW  COMPARISON:  None.  FINDINGS: The patient's enteric tube is noted ending overlying the gastroesophageal junction, with the side port at the distal esophagus. This could be advanced another 14 cm.  The stomach contains air. The visualized bowel gas pattern is grossly unremarkable. Gallstones are seen overlying the right upper quadrant. No acute osseous abnormalities are identified.  IMPRESSION: Enteric tube noted ending overlying the gastroesophageal junction, with the side port at the distal esophagus. This could be advanced another 14 cm.    Electronically Signed   By: Garald Balding M.D.   On: 06/10/2015 04:55    ROS Unable to be obtained due to patient factors Blood pressure 125/58, pulse 104, temperature 97.7 F (36.5 C), temperature source Oral, resp. rate 30, height $RemoveBe'5\' 2"'[id number]$  (1.575 m), weight 162 lb 4.1 oz (73.6 kg), last menstrual period 07/24/1977, SpO2 98 %. Physical Exam  Constitutional: She appears well-developed and well-nourished.  HENT:  ETT in place   Cardiovascular: Normal rate and regular rhythm.   Respiratory: Effort normal and breath sounds normal.  GI: Soft. Bowel sounds are normal. There is no tenderness.  Musculoskeletal: Edema: trace pedal edema b/l.  Neurological:  Open eyes to touch   Skin: Skin is warm and dry.   Physical Exam  Constitutional: She appears well-developed and well-nourished.  HENT:  ETT in place   Cardiovascular: Normal rate and regular rhythm.   Pulmonary/Chest: Effort normal and breath sounds normal.  Abdominal: Soft. Bowel sounds are normal. There is no tenderness.  Musculoskeletal: Edema: trace pedal edema b/l.  Neurological:  Open eyes to touch   Skin: Skin is warm and dry.  DM retinal changes Gr 2/6 M Very few bs, but soft Assessment/Plan: 1. Acute on chronic kidney disease-non oliguric. Cr 2.85 on admission trending up to 4.02 today, baseline around 1.6-2.0.  BUN  112. CVP 12.  UOP minimally picking up with pressor support. Likely 2/2 cardiogenic shock and sepsis. SPEP and UPEP pending.  - will start CRRT   Acidemic , ^ K, low SNa will do HD.  CKD 4 limits recovery and expectations and ^ complications.  Need to correct with acute situation.  Urine vol better prognosis - Dr. Haroldine Laws contacted who will insert HD cath later this evening - urine Na and Cr ordered - renal ultrasound 2. Cardiogenic shock- per HF team. ECHO on 7/17 reveals EF of 20% w/ severe MR. Last ECHO this year on 01/2015 EF was 30-35%. On vasopressin and norepinephrine gtt. Scheduled lasix $RemoveBeforeD'160mg'[id number]$  BID.   4. Anemia of chronic kidney disease- Fe normal, TIBC low (165), can consider sevelamer once stable 6. Hyponatremia- Corrected Na  for hyperglycemia is 130. likely 2/2 volume overload and CHF.  7. Lower tooth abscess- on vanc and zosyn 8. UA with many yeast- received dose of diflucan 9 DM  Julious Oka 06/10/2015, 12:25 PM I have seen and examined this patient and agree with the plan of care seen , examined, eval, discussed with resident, husband, and nurse.  Options explained with benefits and risks.  .  Cleotis Sparr L 06/10/2015, 4:17 PM

## 2015-06-10 NOTE — Progress Notes (Signed)
  Overnight events noted. Patient intubated by CCM for acute respiratory failure. Multiorgan failure. Discussed with Dr.Bensimhon. CCM will provide general medical care. TRH will sign off at this time. Please contact us for any further assistance.  Vernell Leep, MD, FACP, FHM. [facility] Pager [phone]  If 7PM-7AM, please contact night-coverage [email or web address] Password [email or web address] 06/10/2015, 11:45 AM

## 2015-06-10 NOTE — Consult Note (Signed)
PULMONARY  / CRITICAL CARE MEDICINE CONSULTATION   Name: NAARAH BORGERDING MRN: [id number] DOB: 08/07/1934    ADMISSION DATE:  06/07/2015 CONSULTATION DATE: June 10, 2015  REQUESTING CLINICIAN: Jolaine Artist, MD PRIMARY SERVICE: Cardiology  CHIEF COMPLAINT:  "I don't feel well"  BRIEF PATIENT DESCRIPTION: 81 y/o woman with significant CV disease (including CAD as well as nonischemic cardiomyopathy, and RAS s/p stent placement) who developed sepsis due to infected tooth.  SIGNIFICANT EVENTS / STUDIES:  7/17 echo >> worsening LV function  LINES / TUBES: CVC in R IJ 7/18>>> ETT 7/18>>> R radial a-line 7/18>>> PIVs Foley 7/16>>>  CULTURES: Blood 17/18>>>NTD  ANTIBIOTICS: Pip-tazo 06/09/15>>> Vanc 06/09/15>>> Fluconazole x1 7/17  HISTORY OF PRESENT ILLNESS:  Ms. Batrez is a 81 y/o woman with a history of significant CV disease (CAD, RAS s/p stent, chronic nonischemic cardiomyopathy) who presented to the ED with complaints of nausea, vomiting, and malaise in the setting of a toothache and a dental exam suggestive of a dental abscess. She had presented to her dentist who reccommended extraction, but held off due to her plavix. She then began to feel generally ill with nausea and vomiting and presented to the ED. She was found to be hypotensive with acute decline in renal function. She was admitted to cardiology who began to workup her shock. Given her high WBC and clinical history of a toothache, sepsis ws considered and PCCM called to better evaluate. Initially, her Co-Ox was higher than would be imaged for someone with severe heart disease (near-60%), but then declined to low 50s. Given her complexity and concern for septic component, PCCM was callled to assit on consultation.  SUBJECTIVE: intubated overnight for hypoxemic respiratory failure and shock with AMS.  VITAL SIGNS: Temp:  [97.6 F (36.4 C)-99.7 F (37.6 C)] 97.7 F (36.5 C) (07/18 0800) Pulse Rate:  [102-125]  102 (07/18 0900) Resp:  [3-43] 30 (07/18 0900) BP: (79-136)/(38-84) 125/58 mmHg (07/18 0822) SpO2:  [91 %-100 %] 97 % (07/18 0900) Arterial Line BP: (103-142)/(52-69) 103/52 mmHg (07/18 0900) FiO2 (%):  [40 %-60 %] 40 % (07/18 0823) Weight:  [73.2 kg (161 lb 6 oz)-73.6 kg (162 lb 4.1 oz)] 73.6 kg (162 lb 4.1 oz) (07/18 0500)  HEMODYNAMICS: CVP:  [7 mmHg-22 mmHg] 12 mmHg  VENTILATOR SETTINGS: Vent Mode:  [-] PRVC FiO2 (%):  [40 %-60 %] 40 % Set Rate:  [22 bmp-30 bmp] 30 bmp Vt Set:  [450 mL] 450 mL PEEP:  [5 cmH20] 5 cmH20 Plateau Pressure:  [21 cmH20-23 cmH20] 21 cmH20  INTAKE / OUTPUT: Intake/Output      07/17 0701 - 07/18 0700 07/18 0701 - 07/19 0700   P.O. 450 0   I.V. (mL/kg) 618.4 (8.4) 44.5 (0.6)   IV Piggyback 1412    Total Intake(mL/kg) 2480.4 (33.7) 44.5 (0.6)   Urine (mL/kg/hr) 565 (0.3) 140 (0.7)   Total Output 565 140   Net +1915.4 -95.5         PHYSICAL EXAMINATION: General:  Pale woman intubated but interactive. Neuro:  Intact, moving all ext to command, not sedated. HEENT:  [place]/AT, PERRL, EOM-I and MMM Neck: +JVD but no tenderness. Cardiovascular: RRR, Split S2. Lungs:  Coarse BS diffusely. Abdomen:  Soft, non-tender, non-distended and +BS. Musculoskeletal:  -edema and -tenderness. Skin:  Intact but thin.  LABS:  CBC  Recent Labs Lab 05/25/2015 1604 06/10/2015 1616 06/09/15 0709 06/10/15 0420  WBC 11.3*  --  12.5* 16.1*  HGB 8.3* 8.5* 8.1* 8.9*  HCT  24.1* 25.0* 24.2* 26.1*  PLT 227  --  240 269   Coag's No results for input(s): APTT, INR in the last 168 hours. BMET  Recent Labs Lab 06/09/15 0709 06/09/15 2315 06/10/15 0420  NA 130* 125* 128*  K 4.8 5.6* 5.4*  CL 101 96* 98*  CO2 18* 16* 16*  BUN 94* 109* 112*  CREATININE 3.30* 4.00* 4.02*  GLUCOSE 197* 331* 279*   Electrolytes  Recent Labs Lab 06/09/15 0709 06/09/15 2315 06/10/15 0420  CALCIUM 8.1* 7.7* 7.5*   Sepsis Markers  Recent Labs Lab 06/09/15 1440 06/10/15 0019  06/10/15 0320  LATICACIDVEN 1.3 1.9 1.5   ABG  Recent Labs Lab 06/10/15 0325 06/10/15 0552 06/10/15 0820  PHART 7.268* 7.249* 7.321*  PCO2ART 31.9* 33.6* 24.1*  PO2ART 153.0* 101.0* 108.0*   Liver Enzymes  Recent Labs Lab 06/18/2015 1604 06/09/15 0709  AST 36 54*  ALT 35 49  ALKPHOS 35* 40  BILITOT 0.4 0.6  ALBUMIN 2.6* 2.4*   Cardiac Enzymes  Recent Labs Lab 05/26/2015 2041 06/09/15 0049 06/09/15 0709  TROPONINI 0.72* 0.95* 1.93*   Glucose  Recent Labs Lab 05/25/2015 2224 06/09/15 0606 06/09/15 1754 06/09/15 2238 06/10/15 0500 06/10/15 0739  GLUCAP 206* 199* 336* 317* 263* 266*    Imaging Dg Chest Port 1 View  06/10/2015   CLINICAL DATA:  Endotracheal tube placement.  Initial encounter.  EXAM: PORTABLE CHEST - 1 VIEW  COMPARISON:  Chest radiograph performed 06/09/2015  FINDINGS: The patient's endotracheal tube is seen ending 5 cm above the carina. An enteric tube is noted extending below the diaphragm. A right IJ line is noted ending about the mid SVC.  Vascular congestion is noted. Left basilar airspace opacity is noted. Increased interstitial markings raise concern for mild pulmonary edema. A small left pleural effusion is suspected. No pneumothorax is identified.  The cardiomediastinal silhouette is normal in size. No acute osseous abnormalities are identified.  IMPRESSION: 1. Endotracheal tube seen ending 5 cm above the carina. 2. Vascular congestion noted. Increased interstitial markings raise concern for mild pulmonary edema. Small left pleural effusion suspected.   Electronically Signed   By: Garald Balding M.D.   On: 06/10/2015 04:52   Dg Chest Port 1 View  06/09/2015   CLINICAL DATA:  81 year old female status post central line placement  EXAM: PORTABLE CHEST - 1 VIEW  COMPARISON:  Prior chest x-ray 06/09/2015  FINDINGS: New right IJ approach central venous catheter. Catheter tip projects over the distal SVC. No evidence of pneumothorax or hemothorax. Similar  appearance of cardiac and mediastinal contours. There is prominence of the right mediastinum concerning for underlying mass or adenopathy. Slightly decreased interstitial edema. Persistent bibasilar atelectasis.  IMPRESSION: 1. The tip of the new right IJ approach central venous catheter projects over the distal SVC. No evidence of pneumothorax or other complication. 2. Improving interstitial edema. 3. Persistent bibasilar atelectasis. 4. Prominence of the right hilum concerning for underlying adenopathy or mass. Consider further evaluation with non emergent CT scan of the chest with contrast.   Electronically Signed   By: Jacqulynn Cadet M.D.   On: 06/09/2015 13:47   Dg Chest Port 1 View  06/09/2015   CLINICAL DATA:  CHF.  Low blood pressure.  EXAM: PORTABLE CHEST - 1 VIEW  COMPARISON:  04/02/2015  FINDINGS: Lungs are hyperinflated. The heart is enlarged. There are perihilar changes of pulmonary edema. More confluent opacity at the left base may represent edema or infectious process. Suspect left pleural effusion.  IMPRESSION: 1. Changes consistent with pulmonary edema. 2. Left lower lobe infiltrate or confluent edema.   Electronically Signed   By: Nolon Nations M.D.   On: 06/09/2015 10:37   Dg Abd Portable 1v  06/10/2015   CLINICAL DATA:  Orogastric tube placement.  Initial encounter.  EXAM: PORTABLE ABDOMEN - 1 VIEW  COMPARISON:  None.  FINDINGS: The patient's enteric tube is noted ending overlying the gastroesophageal junction, with the side port at the distal esophagus. This could be advanced another 14 cm.  The stomach contains air. The visualized bowel gas pattern is grossly unremarkable. Gallstones are seen overlying the right upper quadrant. No acute osseous abnormalities are identified.  IMPRESSION: Enteric tube noted ending overlying the gastroesophageal junction, with the side port at the distal esophagus. This could be advanced another 14 cm.   Electronically Signed   By: Garald Balding M.D.    On: 06/10/2015 04:55    EKG: Atrial Flutter with LBBB CXR: Mild volume overload as expressed by tracer interstitial edema plus few dependent areas of alveolar filling pattern.  ASSESSMENT / PLAN:  PULMONARY A: Mild pulmonary edema Hypoxia P:   - Full vent support for now. - ABG and CXR in AM. - VAP preventions. - Titrate O2 for sat of 88-92%.  CARDIOVASCULAR A: Acute on chronic systolic heart failure. P:   - Levophed for MAP of 65 mmHg. - Follow CVP. - Add vasopressin for BP support (levo at 26 currently).  RENAL A: AKI - Cr now plateaued but UOP is poor. P:   - Renal following. - Likely will need CVVH by AM. - Replace electrolytes as indicated. - Monitor UOP. - Continue hydration. - Lasix as ordered.  GASTROINTESTINAL A: OGT kinked. P:   - Reinsert OGT. - Start TF per nutrition.  HEMATOLOGIC A: Anemia P:   - Will follow up, transfuse for Hb <7.0. - CBC in AM.  INFECTIOUS A: Septic shock, likely due to oral source P:   - Once improved will need dental extractions. - Blood culture 7/18>>> - Vancomycin 7/18>>> - Zosyn 7/18>>>  ENDOCRINE A: DM2 P:   - Maintain glycemic control per CCM protocol.  NEUROLOGIC A: No active issues P:   - Sedated and intubated, RASS goal of 0. - Add PRN versed and fentanyl.  The patient is critically ill with multiple organ systems failure and requires high complexity decision making for assessment and support, frequent evaluation and titration of therapies, application of advanced monitoring technologies and extensive interpretation of multiple databases.   Critical Care Time devoted to patient care services described in this note is  35  Minutes. This time reflects time of care of this signee Dr Jennet Maduro. This critical care time does not reflect procedure time, or teaching time or supervisory time of PA/NP/Med student/Med Resident etc but could involve care discussion time.  Rush Farmer, M.D. [facility]  Pulmonary/Critical Care Medicine. Pager: [phone]. After hours pager: [phone].  06/10/2015, 9:48 AM

## 2015-06-10 NOTE — Procedures (Signed)
Central Venous Dialysis Insertion Procedure Note Katie Fuentes [id number] 06-Mar-1934  Procedure: Insertion of Central Venous Dialysis Catheter Indications: CVVHD  Procedure Details Consent: Risks of procedure as well as the alternatives and risks of each were explained to the (patient/caregiver).  Consent for procedure obtained. Time Out: Verified patient identification, verified procedure, site/side was marked, verified correct patient position, special equipment/implants available, medications/allergies/relevent history reviewed, required imaging and test results available.  Performed  Maximum sterile technique was used including antiseptics, cap, gloves, gown, hand hygiene, mask and sheet. Skin prep: Chlorhexidine; local anesthetic administered A antimicrobial bonded/coated triple lumen catheter was placed in the right femoral vein using the Seldinger technique.  Evaluation Blood flow good Complications: No apparent complications Patient did tolerate procedure well.  [name] MD 06/10/2015, 7:30 PM

## 2015-06-10 NOTE — Progress Notes (Signed)
Results for BELYNDA, PAGADUAN (MRN [id number]) as of 06/10/2015 10:09  Ref. Range 06/09/2015 06:06 06/09/2015 17:54 06/09/2015 22:38 06/10/2015 05:00 06/10/2015 07:39  Glucose-Capillary Latest Ref Range: 65-99 mg/dL 199 (H) 336 (H) 317 (H) 263 (H) 266 (H)  CBGs continue to be greater than 180 mg/dl.  Recommend adding low dose Lantus 15 units daily and continuing  Novolog RESISTANT scale every 4 hours while NPO.  Will continue to follow while in hospital. Harvel Ricks RN BSN CDE

## 2015-06-10 NOTE — Progress Notes (Signed)
Advanced Heart Failure Rounding Note   Subjective:    81 y/o woman well known to me from recent prolonged admission in April 2016 with severe HTN, decompensated systolic HF with EF 40-34%, moderate CAD and stage 4 CKD with baseline creatinine 1.6-2.0. During that admission had several episodes of flash pulmonary edema in setting of severe HTN. Cath with severely calcified coronaries and question of significant high-grade LM lesion however IVUS showed it to be 60%. Cath also showed high-grade right RAS which was stented. Hospitalization c/b AKI with peak creatinine 3.3.  She was discharged to SNF and was doing well until recently when she was found to have possible tooth abscess. Saw a dentist who put her on clindamycin but was awaiting cardiology clearance to proceed given plavix. Came to ER with nausea, vomiting, poor appetite, generalized weakness, dental pain. Denied F/C. Found to be hypotensive with SBP 80s.   Admitted for possible dehydration. IVF started and she developed CHF. Cr 1.9-> 2.9-> 3.3. WBC 12.5. BNP 3120 (baseline 320). Weigh 153 on admit (was 151 on 7/6). Cardiology consulted and started norepi and moved to ICU.   Yesterday she continued on Vanc and Zosyn for tooth abcess. UA with many yeast and she was given 200 mg diflucan. Over night respirations were more labored and required intubation.  Norepi titrated up to 40 mcg. Weight up 2 pounds.    Cr 1.9-> 2.9-> 3.3>4.02  WBC 12>16   Objective:   Weight Range:  Vital Signs:   Temp:  [97.6 F (36.4 C)-99.7 F (37.6 C)] 99.7 F (37.6 C) (07/18 0400) Pulse Rate:  [105-125] 125 (07/18 0400) Resp:  [19-43] 22 (07/18 0400) BP: (79-136)/(31-84) 98/57 mmHg (07/18 0400) SpO2:  [91 %-100 %] 96 % (07/18 0400) FiO2 (%):  [60 %] 60 % (07/18 0400) Weight:  [161 lb 6 oz (73.2 kg)-162 lb 4.1 oz (73.6 kg)] 162 lb 4.1 oz (73.6 kg) (07/18 0500) Last BM Date: 06/06/15  Weight change: Filed Weights   06/09/15 0338 06/09/15 1130  06/10/15 0500  Weight: 153 lb 14.1 oz (69.8 kg) 161 lb 6 oz (73.2 kg) 162 lb 4.1 oz (73.6 kg)    Intake/Output:   Intake/Output Summary (Last 24 hours) at 06/10/15 0716 Last data filed at 06/10/15 0600  Gross per 24 hour  Intake 1257.25 ml  Output    565 ml  Net 692.25 ml     Physical Exam: CVP 15 General:  Intubated.  HEENT: normal Neck: supple. JVP to jaw. Carotids 2+ bilat; no bruits. No lymphadenopathy or thryomegaly appreciated. RIJ  Cor: PMI nondisplaced. Regular rate & rhythm. No rubs, gallops or murmurs. Lungs: clear Abdomen: soft, nontender, nondistended. No hepatosplenomegaly. No bruits or masses. Good bowel sounds. Extremities: no cyanosis, clubbing, rash, R and LLE 2+ edema Neuro: Intubated. Follows commands. Moves all 4 extremities w/o difficulty.   Telemetry: Sinus Tach   Labs: Basic Metabolic Panel:  Recent Labs Lab 06/05/2015 1604 06/17/2015 1616 06/09/15 0709 06/09/15 2315 06/10/15 0420  NA 128* 128* 130* 125* 128*  K 4.9 4.8 4.8 5.6* 5.4*  CL 101 99* 101 96* 98*  CO2 18*  --  18* 16* 16*  GLUCOSE 294* 289* 197* 331* 279*  BUN 89* 94* 94* 109* 112*  CREATININE 2.85* 2.70* 3.30* 4.00* 4.02*  CALCIUM 8.1*  --  8.1* 7.7* 7.5*    Liver Function Tests:  Recent Labs Lab 05/27/2015 1604 06/09/15 0709  AST 36 54*  ALT 35 49  ALKPHOS 35* 40  BILITOT 0.4  0.6  PROT 5.8* 5.7*  ALBUMIN 2.6* 2.4*   No results for input(s): LIPASE, AMYLASE in the last 168 hours. No results for input(s): AMMONIA in the last 168 hours.  CBC:  Recent Labs Lab 06/06/2015 1604 05/29/2015 1616 06/09/15 0709 06/10/15 0420  WBC 11.3*  --  12.5* 16.1*  HGB 8.3* 8.5* 8.1* 8.9*  HCT 24.1* 25.0* 24.2* 26.1*  MCV 87.6  --  86.7 85.0  PLT 227  --  240 269    Cardiac Enzymes:  Recent Labs Lab 05/24/2015 1614 06/04/2015 2041 06/09/15 0049 06/09/15 0709  TROPONINI 0.53* 0.72* 0.95* 1.93*    BNP: BNP (last 3 results)  Recent Labs  04/03/15 1210 04/09/15 1115  06/09/15 1155  BNP 807.1* 323.0* 3120.5*    ProBNP (last 3 results)  Recent Labs  03/01/15 0840  PROBNP 742.0*      Other results:  Imaging: Dg Chest Port 1 View  06/10/2015   CLINICAL DATA:  Endotracheal tube placement.  Initial encounter.  EXAM: PORTABLE CHEST - 1 VIEW  COMPARISON:  Chest radiograph performed 06/09/2015  FINDINGS: The patient's endotracheal tube is seen ending 5 cm above the carina. An enteric tube is noted extending below the diaphragm. A right IJ line is noted ending about the mid SVC.  Vascular congestion is noted. Left basilar airspace opacity is noted. Increased interstitial markings raise concern for mild pulmonary edema. A small left pleural effusion is suspected. No pneumothorax is identified.  The cardiomediastinal silhouette is normal in size. No acute osseous abnormalities are identified.  IMPRESSION: 1. Endotracheal tube seen ending 5 cm above the carina. 2. Vascular congestion noted. Increased interstitial markings raise concern for mild pulmonary edema. Small left pleural effusion suspected.   Electronically Signed   By: Garald Balding M.D.   On: 06/10/2015 04:52   Dg Chest Port 1 View  06/09/2015   CLINICAL DATA:  81 year old female status post central line placement  EXAM: PORTABLE CHEST - 1 VIEW  COMPARISON:  Prior chest x-ray 06/09/2015  FINDINGS: New right IJ approach central venous catheter. Catheter tip projects over the distal SVC. No evidence of pneumothorax or hemothorax. Similar appearance of cardiac and mediastinal contours. There is prominence of the right mediastinum concerning for underlying mass or adenopathy. Slightly decreased interstitial edema. Persistent bibasilar atelectasis.  IMPRESSION: 1. The tip of the new right IJ approach central venous catheter projects over the distal SVC. No evidence of pneumothorax or other complication. 2. Improving interstitial edema. 3. Persistent bibasilar atelectasis. 4. Prominence of the right hilum  concerning for underlying adenopathy or mass. Consider further evaluation with non emergent CT scan of the chest with contrast.   Electronically Signed   By: Jacqulynn Cadet M.D.   On: 06/09/2015 13:47   Dg Chest Port 1 View  06/09/2015   CLINICAL DATA:  CHF.  Low blood pressure.  EXAM: PORTABLE CHEST - 1 VIEW  COMPARISON:  04/02/2015  FINDINGS: Lungs are hyperinflated. The heart is enlarged. There are perihilar changes of pulmonary edema. More confluent opacity at the left base may represent edema or infectious process. Suspect left pleural effusion.  IMPRESSION: 1. Changes consistent with pulmonary edema. 2. Left lower lobe infiltrate or confluent edema.   Electronically Signed   By: Nolon Nations M.D.   On: 06/09/2015 10:37   Dg Abd Portable 1v  06/10/2015   CLINICAL DATA:  Orogastric tube placement.  Initial encounter.  EXAM: PORTABLE ABDOMEN - 1 VIEW  COMPARISON:  None.  FINDINGS: The patient's  enteric tube is noted ending overlying the gastroesophageal junction, with the side port at the distal esophagus. This could be advanced another 14 cm.  The stomach contains air. The visualized bowel gas pattern is grossly unremarkable. Gallstones are seen overlying the right upper quadrant. No acute osseous abnormalities are identified.  IMPRESSION: Enteric tube noted ending overlying the gastroesophageal junction, with the side port at the distal esophagus. This could be advanced another 14 cm.   Electronically Signed   By: Garald Balding M.D.   On: 06/10/2015 04:55      Medications:     Scheduled Medications: . antiseptic oral rinse  15 mL Mouth Rinse QID  . antiseptic oral rinse  7 mL Mouth Rinse QID  . aspirin EC  81 mg Oral Daily  . atorvastatin  10 mg Oral QPM  . chlorhexidine  15 mL Mouth/Throat BID  . clopidogrel  75 mg Oral Daily  . feeding supplement (ENSURE ENLIVE)  237 mL Oral BID BM  . heparin  5,000 Units Subcutaneous 3 times per day  . insulin aspart  0-5 Units Subcutaneous QHS   . insulin aspart  0-9 Units Subcutaneous 6 times per day  . lidocaine (cardiac) 100 mg/36ml      . pantoprazole (PROTONIX) IV  40 mg Intravenous Q24H  . piperacillin-tazobactam (ZOSYN)  IV  2.25 g Intravenous 4 times per day  . saccharomyces boulardii  250 mg Oral QHS  . sodium chloride  3 mL Intravenous Q12H  . succinylcholine      . vancomycin  1,000 mg Intravenous Q48H     Infusions: . norepinephrine (LEVOPHED) Adult infusion 22 mcg/min (06/09/15 2300)     PRN Medications:  Place/Maintain arterial line **AND** sodium chloride, acetaminophen **OR** acetaminophen, albuterol, morphine injection, ondansetron **OR** ondansetron (ZOFRAN) IV   Assessment:  1) Shock cardiogenic vs septic 2) Acute on chronic systolic HF 3) Acute respiratory failure 4) A/C renal failure stage 4 5) Elevated troponin - suspect HF +/- demand ischemia 6) Anemia of chronic disease 7) Acute Respiratory Failure--> Intubated 7/17 8) Uncontrolled DM 9) Elevated WBC--Blood cultures pending  10) UTI- UA many yeast-    Plan/Discussion:   Intubated last night . CCM appreciated.   Requiring increased Norepi. Now up to 40 mcg. Hypotension--sepsis vs cardiogenic shock. CO-OX 66%. WBC trending up . On Vanc and zosyn. Also received a dose of diflucan.   Worsening renal function and volume status trending up.  No BB with hypotension. SPEP UPEP pending. Concern for amyloid. Will likely need CRRT.  Glucose uncontrolled. Changed to resistant sliding scale.    Length of Stay: 2 CLEGG,AMY  NP-C   06/10/2015, 7:16 AM  Advanced Heart Failure Team Pager [phone] (M-F; [place])  Please contact [facility] for night-coverage after hours (4p -7a ) and weekends on [email or web address]   Patient seen and examined with Darrick Grinder, NP. We discussed all aspects of the encounter. I agree with the assessment and plan as stated above.   Intubated over night for worsening volume and respiratory status in face of progressive renal  failure. ABG much improved. However now on high-dose norepi. Co-ox ok. Initial event looking more like sepsis with secondary cardiogenic shock. Continue broad spectrum abx.    Urine output minimal but creatinine now plateaued. Will give high-dose lasix 160mg  IV Q12 for CVP > 12. Have asked Renal to see. I suspect she will need CVVHD but given plateau in creatinine and stabilized respiratory status on vent, we may be able  to wait another day to see what renal function does. Will await Renal's evaluation.   SPEP/UPEP pending.   The patient is critically ill with multiple organ systems failure and requires high complexity decision making for assessment and support, frequent evaluation and titration of therapies, application of advanced monitoring technologies and extensive interpretation of multiple databases.   Critical Care Time devoted to patient care services described in this note is 35 Minutes.  Bensimhon, Daniel,MD 9:17 AM

## 2015-06-10 NOTE — Progress Notes (Signed)
Initial Nutrition Assessment   INTERVENTION:   Initiate Vital AF 1.2 @ 25 ml/hr via OG tube and increase by 10 ml every 2 hours to goal rate of 55 ml/hr.   30 ml Prostat daily.    Tube feeding regimen provides 1684 kcal (98% of needs), 114 grams of protein, and 1070 ml of H2O.    NUTRITION DIAGNOSIS:   Inadequate oral intake related to inability to eat as evidenced by NPO status.   GOAL:   Patient will meet greater than or equal to 90% of their needs   MONITOR:   TF tolerance, Vent status, Labs, I & O's  REASON FOR ASSESSMENT:   Consult Enteral/tube feeding initiation and management  ASSESSMENT:   Pt with significant CV disease (including CAD as well as nonischemic cardiomyopathy, and RAS s/p stent placement) who developed sepsis due to infected tooth.  Patient intubated 7/18 and is on ventilator support MV: 14 L/min Temp (24hrs), Avg:98.7 F (37.1 C), Min:97.6 F (36.4 C), Max:100.8 F (38.2 C)  Labs reviewed: cbg's: 263-266, sodium low, potassium, BUN/Cr, and glucose elevated Medications reviewed and include: florastor OG tube tube in place, tip in mid-stomach  Nutrition-Focused physical exam completed. Findings are no fat depletion, no muscle depletion, and no edema.  Discussed with RN.     Diet Order:  Diet NPO time specified  Skin:  Reviewed, no issues  Last BM:  PTA  Height:   Ht Readings from Last 1 Encounters:  06/09/15 5\' 2"  (1.575 m)    Weight:   Wt Readings from Last 1 Encounters:  06/10/15 162 lb 4.1 oz (73.6 kg)    Ideal Body Weight:  50 kg  Wt Readings from Last 10 Encounters:  06/10/15 162 lb 4.1 oz (73.6 kg)  06/04/15 151 lb 3.2 oz (68.584 kg)  05/30/15 153 lb (69.4 kg)  05/29/15 151 lb 12 oz (68.833 kg)  04/24/15 149 lb 4 oz (67.699 kg)  04/24/15 146 lb (66.225 kg)  04/09/15 153 lb 4 oz (69.514 kg)  04/03/15 153 lb 8 oz (69.627 kg)  04/02/15 153 lb 14.1 oz (69.8 kg)  03/01/15 161 lb (73.029 kg)    BMI:  Body mass  index is 29.67 kg/(m^2).  Estimated Nutritional Needs:   Kcal:  1724  Protein:  108-115 grams  Fluid:  > 1.7 L/day  EDUCATION NEEDS:   No education needs identified at this time  [place], [place], [place] Pager [phone] After Hours Pager

## 2015-06-10 NOTE — Consult Note (Signed)
PULMONARY  / CRITICAL CARE MEDICINE CONSULTATION   Name: Katie Fuentes MRN: [id number] DOB: 1934-09-24    ADMISSION DATE:  05/31/2015 CONSULTATION DATE: June 10, 2015  REQUESTING CLINICIAN: Jolaine Artist, MD PRIMARY SERVICE: Cardiology  CHIEF COMPLAINT:  "I don't feel well"  BRIEF PATIENT DESCRIPTION: 81 y/o woman with significant CV disease (including CAD as well as nonischemic cardiomyopathy, and RAS s/p stent placement) who developed sepsis due to infected tooth.  SIGNIFICANT EVENTS / STUDIES:  7/17 echo >> worsening LV function  LINES / TUBES: CVC in R IJ PIVs Foley  CULTURES: Blood 17/18  ANTIBIOTICS: Pip-tazo 06/09/15-- Vanc 06/09/15 -- Fluconazole x1 7/17  HISTORY OF PRESENT ILLNESS:  Katie Fuentes is a 81 y/o woman with a history of significant CV disease (CAD, RAS s/p stent, chronic nonischemic cardiomyopathy) who presented to the ED with complaints of nausea, vomiting, and malaise in the setting of a toothache and a dental exam suggestive of a dental abscess. She had presented to her dentist who reccommended extraction, but held off due to her plavix. She then began to feel generally ill with nausea and vomiting and presented to the ED. She was found to be hypotensive with acute decline in renal function. She was admitted to cardiology who began to workup her shock. Given her high WBC and clinical history of a toothache, sepsis ws considered and PCCM called to better evaluate. Initially, her Co-Ox was higher than would be imaged for someone with severe heart disease (near-60%), but then declined to low 50s. Given her complexity and concern for septic component, PCCm was callled to assit on consultation.   PAST MEDICAL HISTORY :  Past Medical History  Diagnosis Date  . Atrophic vaginitis   . Yeast infection   . Glaucoma   . Cataracts, bilateral   . Type 2 diabetes, controlled, with renal manifestation 01/2000  . Hyperlipidemia   . Hypertension   .  Congestive heart failure   . Cardiomyopathy   . LBBB (left bundle branch block)   . Chronic renal disease, stage III   . Arthritis    Past Surgical History  Procedure Laterality Date  . Vaginal delivery      x3  . Colonoscopy  05/2003  . Finger surgery    . Cataract extraction w/ intraocular lens implant Right 2011    1 year later the left side done  . Colonoscopy  06/2008    Dr. Lajoyce Corners recheck in 5 years  . Total vaginal hysterectomy  1978    for endometriosis  . Appendectomy  1950  . Squamous cell carcinoma excision  6/15    Nose  . Left and right heart catheterization with coronary angiogram N/A 03/18/2015    Procedure: LEFT AND RIGHT HEART CATHETERIZATION WITH CORONARY ANGIOGRAM;  Surgeon: Jolaine Artist, MD;  Location: [facility] CATH LAB;  Service: Cardiovascular;  Laterality: N/A;  . Peripheral vascular catheterization N/A 03/29/2015    Procedure: Renal Intervention;  Surgeon: Serafina Mitchell, MD;  Location: [place] CV LAB;  Service: Cardiovascular;  Laterality: N/A;  . Percutaneous stent intervention Left 03/29/2015    Procedure: Percutaneous Stent Intervention;  Surgeon: Serafina Mitchell, MD;  Location: [place] CV LAB;  Service: Cardiovascular;  Laterality: Left;  Renal  . Eye surgery    . Abdominal hysterectomy     Prior to Admission medications   Medication Sig Start Date End Date Taking? Authorizing Provider  acetaminophen (TYLENOL) 500 MG tablet Take 500 mg by mouth every 6 (  six) hours as needed for mild pain (Using for root canal).   Yes Historical Provider, MD  Ascorbic Acid (VITAMIN C) 1000 MG tablet Take 1,000 mg by mouth daily.   Yes Historical Provider, MD  aspirin 81 MG tablet Take 81 mg by mouth at bedtime.    Yes Historical Provider, MD  atorvastatin (LIPITOR) 10 MG tablet Take 10 mg by mouth every evening.    Yes Historical Provider, MD  beta carotene 10000 UNIT capsule Take 10,000 Units by mouth daily.   Yes Historical Provider, MD  calcium citrate (CALCITRATE -  DOSED IN MG ELEMENTAL CALCIUM) 950 MG tablet Take 1 tablet by mouth 2 (two) times daily.    Yes Historical Provider, MD  carvedilol (COREG) 12.5 MG tablet Take 1 tablet (12.5 mg total) by mouth 2 (two) times daily with a meal. 04/24/15  Yes Larey Dresser, MD  cholecalciferol (VITAMIN D) 1000 UNITS tablet Take 1,000 Units by mouth daily.   Yes Historical Provider, MD  clopidogrel (PLAVIX) 75 MG tablet Take 1 tablet (75 mg total) by mouth daily. 04/02/15  Yes Amy D Clegg, NP  docusate sodium (COLACE) 100 MG capsule Take 100 mg by mouth daily.   Yes Historical Provider, MD  glucosamine-chondroitin 500-400 MG tablet Take 1 tablet by mouth daily.    Yes Historical Provider, MD  glyBURIDE (DIABETA) 5 MG tablet Take 5 mg by mouth daily with breakfast.   Yes Historical Provider, MD  iron polysaccharides (NIFEREX) 150 MG capsule Take 1 capsule (150 mg total) by mouth 2 (two) times daily. 02/18/15  Yes Bonnielee Haff, MD  isosorbide mononitrate (IMDUR) 30 MG 24 hr tablet Take 1 tablet (30 mg total) by mouth daily. 04/04/15  Yes Almyra Deforest, PA  L-Methylfolate-B6-B12 (METANX PO) Take 1 tablet by mouth daily.    Yes Historical Provider, MD  lisinopril (PRINIVIL,ZESTRIL) 5 MG tablet Take 1 tab in AM and 1/2 tab in PM 05/29/15  Yes Larey Dresser, MD  nitroGLYCERIN (NITROSTAT) 0.4 MG SL tablet Place 1 tablet (0.4 mg total) under the tongue every 5 (five) minutes x 3 doses as needed for chest pain. 04/04/15  Yes Almyra Deforest, PA  Probiotic Product (PROBIOTIC DAILY PO) Take 1 tablet by mouth at bedtime. Florastor   Yes Historical Provider, MD  sitaGLIPtin (JANUVIA) 100 MG tablet Take 100 mg by mouth daily.   Yes Historical Provider, MD  spironolactone (ALDACTONE) 25 MG tablet Take 12.5 mg by mouth daily.   Yes Historical Provider, MD  torsemide (DEMADEX) 20 MG tablet Take 20 mg by mouth every other day.   Yes Historical Provider, MD  Travoprost, BAK Free, (TRAVATAN) 0.004 % SOLN ophthalmic solution Place 1 drop into both eyes  at bedtime.   Yes Historical Provider, MD  vitamin E 400 UNIT capsule Take 400 Units by mouth daily.   Yes Historical Provider, MD   Allergies  Allergen Reactions  . Cefdinir Diarrhea  . Macrobid [Nitrofurantoin Macrocrystal] Nausea And Vomiting and Other (See Comments)    Fever, Chills  . Sulfa Antibiotics Nausea And Vomiting and Other (See Comments)    Fever,chills  . Alphagan [Brimonidine] Other (See Comments)    Redness in the eyes  . Azopt [Brinzolamide]     Redness in the eyes    FAMILY HISTORY:  Family History  Problem Relation Age of Onset  . Heart disease  67  . CAD Brother   . Hypertension Brother    SOCIAL HISTORY:  reports that she has quit  smoking. Her smoking use included Cigarettes. She has a .2 pack-year smoking history. She has never used smokeless tobacco. She reports that she does not drink alcohol or use illicit drugs.  REVIEW OF SYSTEMS:  Per HPI  SUBJECTIVE:   VITAL SIGNS: Temp:  [97.6 F (36.4 C)-98.9 F (37.2 C)] 97.7 F (36.5 C) (07/18 0000) Pulse Rate:  [103-123] 116 (07/18 0100) Resp:  [20-43] 32 (07/18 0100) BP: (79-136)/(31-72) 115/66 mmHg (07/18 0100) SpO2:  [91 %-99 %] 97 % (07/18 0100) Weight:  [153 lb 14.1 oz (69.8 kg)-161 lb 6 oz (73.2 kg)] 161 lb 6 oz (73.2 kg) (07/17 1130) HEMODYNAMICS: CVP:  [7 mmHg-16 mmHg] 13 mmHg VENTILATOR SETTINGS:   INTAKE / OUTPUT: Intake/Output      07/17 0701 - 07/18 0700   P.O. 450   I.V. (mL/kg) 495.3 (6.8)   IV Piggyback 312   Total Intake(mL/kg) 1257.3 (17.2)   Urine (mL/kg/hr) 415 (0.2)   Total Output 415   Net +842.3         PHYSICAL EXAMINATION: General:  Pale woman with mask on mild respiratory distress. Neuro:  Intact, although evidence of slurring. HEENT:  MMM Neck:  Cardiovascular: Heart sounds dual and normal. Lungs:  CTAB Abdomen:  Soft, non-tender, non-distended. Musculoskeletal:  Intact Skin:  Intact.  LABS:  CBC  Recent Labs Lab 06/06/2015 1604 06/07/2015 1616  06/09/15 0709  WBC 11.3*  --  12.5*  HGB 8.3* 8.5* 8.1*  HCT 24.1* 25.0* 24.2*  PLT 227  --  240   Coag's No results for input(s): APTT, INR in the last 168 hours. BMET  Recent Labs Lab 06/05/2015 1604 06/10/2015 1616 06/09/15 0709 06/09/15 2315  NA 128* 128* 130* 125*  K 4.9 4.8 4.8 5.6*  CL 101 99* 101 96*  CO2 18*  --  18* 16*  BUN 89* 94* 94* 109*  CREATININE 2.85* 2.70* 3.30* 4.00*  GLUCOSE 294* 289* 197* 331*   Electrolytes  Recent Labs Lab 05/27/2015 1604 06/09/15 0709 06/09/15 2315  CALCIUM 8.1* 8.1* 7.7*   Sepsis Markers  Recent Labs Lab 06/09/15 1440 06/10/15 0019  LATICACIDVEN 1.3 1.9   ABG  Recent Labs Lab 06/09/15 1216 06/10/15 0003  PHART 7.243* 7.292*  PCO2ART 30.0* 29.7*  PO2ART 68.0* 89.0   Liver Enzymes  Recent Labs Lab 05/31/2015 1604 06/09/15 0709  AST 36 54*  ALT 35 49  ALKPHOS 35* 40  BILITOT 0.4 0.6  ALBUMIN 2.6* 2.4*   Cardiac Enzymes  Recent Labs Lab 06/04/2015 2041 06/09/15 0049 06/09/15 0709  TROPONINI 0.72* 0.95* 1.93*   Glucose  Recent Labs Lab 06/03/15 1335 06/03/15 1426 06/19/2015 2224 06/09/15 0606 06/09/15 1754  GLUCAP 126* 99 206* 199* 336*    Imaging Dg Chest Port 1 View  06/09/2015   CLINICAL DATA:  81 year old female status post central line placement  EXAM: PORTABLE CHEST - 1 VIEW  COMPARISON:  Prior chest x-ray 06/09/2015  FINDINGS: New right IJ approach central venous catheter. Catheter tip projects over the distal SVC. No evidence of pneumothorax or hemothorax. Similar appearance of cardiac and mediastinal contours. There is prominence of the right mediastinum concerning for underlying mass or adenopathy. Slightly decreased interstitial edema. Persistent bibasilar atelectasis.  IMPRESSION: 1. The tip of the new right IJ approach central venous catheter projects over the distal SVC. No evidence of pneumothorax or other complication. 2. Improving interstitial edema. 3. Persistent bibasilar atelectasis.  4. Prominence of the right hilum concerning for underlying adenopathy or mass. Consider further evaluation  with non emergent CT scan of the chest with contrast.   Electronically Signed   By: Jacqulynn Cadet M.D.   On: 06/09/2015 13:47   Dg Chest Port 1 View  06/09/2015   CLINICAL DATA:  CHF.  Low blood pressure.  EXAM: PORTABLE CHEST - 1 VIEW  COMPARISON:  04/02/2015  FINDINGS: Lungs are hyperinflated. The heart is enlarged. There are perihilar changes of pulmonary edema. More confluent opacity at the left base may represent edema or infectious process. Suspect left pleural effusion.  IMPRESSION: 1. Changes consistent with pulmonary edema. 2. Left lower lobe infiltrate or confluent edema.   Electronically Signed   By: Nolon Nations M.D.   On: 06/09/2015 10:37    EKG: Atrial Flutter with LBBB CXR: Mild volume overload as expressed by tracer interstitial edema plus few dependent areas of alveolar filling pattern.  ASSESSMENT / PLAN:  PULMONARY A: Mild pulmonary edema Hypoxia P:   Likely multifactorial including sepsis and CHF exacerbation as well as sepsis. Has improved with oxygen.  CARDIOVASCULAR A: Acute on chronic systolic heart failure. P:   Presently more sepsis than heart failure, by clinical assessment. With bolus 1 L NS and rechheck chemistries.Marland Kitchen  RENAL A: AKI P:   Essentially anuric since beginning of night, likely ATN due to sepsis.  GASTROINTESTINAL A: No active issues P:    HEMATOLOGIC A: Anemia P:   Will follow up, transfuse for Hb <7.0  INFECTIOUS A: Septic shock, likely due to oral source P:   Will need urgent eval in AM for dental extraction to obtain source control. Continue coverage for gram positives, gram negatives, and anarobes. Cautious fluid management.  ENDOCRINE A: DM2 P:   Maintain glycemic control per CCM protocol.  NEUROLOGIC A: No active issues P:     TODAY'S SUMMARY: 81 y/o woman with mixed cardiogenic and septic  shock.  I have personally obtained a history, examined the patient, evaluated laboratory and imaging results, formulated the assessment and plan and placed orders.  CRITICAL CARE: The patient is critically ill with multiple organ systems failure and requires high complexity decision making for assessment and support, frequent evaluation and titration of therapies, application of advanced monitoring technologies and extensive interpretation of multiple databases. Critical Care Time devoted to patient care services described in this note is 137 minutes.   Margarette Asal, MD Pulmonary and [place] Pager: [phone]   06/10/2015, 2:32 AM

## 2015-06-11 ENCOUNTER — Ambulatory Visit: Payer: Medicare Other

## 2015-06-11 ENCOUNTER — Inpatient Hospital Stay (HOSPITAL_COMMUNITY): Payer: Medicare Other

## 2015-06-11 DIAGNOSIS — I480 Paroxysmal atrial fibrillation: Secondary | ICD-10-CM

## 2015-06-11 LAB — RENAL FUNCTION PANEL
ALBUMIN: 2.1 g/dL — AB (ref 3.5–5.0)
ALBUMIN: 2.1 g/dL — AB (ref 3.5–5.0)
ANION GAP: 18 — AB (ref 5–15)
Anion gap: 17 — ABNORMAL HIGH (ref 5–15)
BUN: 66 mg/dL — ABNORMAL HIGH (ref 6–20)
BUN: 34 mg/dL — AB (ref 6–20)
CALCIUM: 7.2 mg/dL — AB (ref 8.9–10.3)
CALCIUM: 7.4 mg/dL — AB (ref 8.9–10.3)
CHLORIDE: 99 mmol/L — AB (ref 101–111)
CO2: 14 mmol/L — AB (ref 22–32)
CO2: 16 mmol/L — AB (ref 22–32)
CREATININE: 1.74 mg/dL — AB (ref 0.44–1.00)
Chloride: 99 mmol/L — ABNORMAL LOW (ref 101–111)
Creatinine, Ser: 2.63 mg/dL — ABNORMAL HIGH (ref 0.44–1.00)
GFR calc Af Amer: 31 mL/min — ABNORMAL LOW (ref >60)
GFR calc non Af Amer: 27 mL/min — ABNORMAL LOW (ref >60)
GFR, EST AFRICAN AMERICAN: 19 mL/min — AB (ref >60)
GFR, EST NON AFRICAN AMERICAN: 16 mL/min — AB (ref >60)
Glucose, Bld: 140 mg/dL — ABNORMAL HIGH (ref 65–99)
Glucose, Bld: 153 mg/dL — ABNORMAL HIGH (ref 65–99)
POTASSIUM: 4.7 mmol/L (ref 3.5–5.1)
Phosphorus: 4.2 mg/dL (ref 2.5–4.6)
Phosphorus: 3.9 mg/dL (ref 2.5–4.6)
Potassium: 4.4 mmol/L (ref 3.5–5.1)
SODIUM: 130 mmol/L — AB (ref 135–145)
SODIUM: 133 mmol/L — AB (ref 135–145)

## 2015-06-11 LAB — UIFE/LIGHT CHAINS/TP QN, 24-HR UR
% BETA, Urine: 51.4 %
ALBUMIN, U: 34.4 %
ALPHA 1 URINE: 2.1 %
Alpha 2, Urine: 5.3 %
Free Kappa/Lambda Ratio: 2.04 (ref 2.04–10.37)
Free Lambda Lt Chains,Ur: 114 mg/L — ABNORMAL HIGH (ref 0.24–6.66)
Free Lt Chn Excr Rate: 233 mg/L — ABNORMAL HIGH (ref 1.35–24.19)
GAMMA GLOBULIN URINE: 6.8 %
Total Protein, Urine: 44.9 mg/dL

## 2015-06-11 LAB — HEPATIC FUNCTION PANEL
ALBUMIN: 2.2 g/dL — AB (ref 3.5–5.0)
ALT: 3029 U/L — AB (ref 14–54)
AST: 3251 U/L — ABNORMAL HIGH (ref 15–41)
Alkaline Phosphatase: 80 U/L (ref 38–126)
BILIRUBIN INDIRECT: 0.9 mg/dL (ref 0.3–0.9)
Bilirubin, Direct: 0.4 mg/dL (ref 0.1–0.5)
TOTAL PROTEIN: 5.9 g/dL — AB (ref 6.5–8.1)
Total Bilirubin: 1.3 mg/dL — ABNORMAL HIGH (ref 0.3–1.2)

## 2015-06-11 LAB — POCT I-STAT 3, ART BLOOD GAS (G3+)
ACID-BASE DEFICIT: 9 mmol/L — AB (ref 0.0–2.0)
Acid-base deficit: 9 mmol/L — ABNORMAL HIGH (ref 0.0–2.0)
BICARBONATE: 13.9 meq/L — AB (ref 20.0–24.0)
Bicarbonate: 14.5 mEq/L — ABNORMAL LOW (ref 20.0–24.0)
O2 Saturation: 99 %
O2 Saturation: 99 %
PO2 ART: 143 mmHg — AB (ref 80.0–100.0)
TCO2: 15 mmol/L (ref 0–100)
TCO2: 15 mmol/L (ref 0–100)
pCO2 arterial: 22.6 mmHg — ABNORMAL LOW (ref 35.0–45.0)
pCO2 arterial: 24.4 mmHg — ABNORMAL LOW (ref 35.0–45.0)
pH, Arterial: 7.381 (ref 7.350–7.450)
pH, Arterial: 7.39 (ref 7.350–7.450)
pO2, Arterial: 138 mmHg — ABNORMAL HIGH (ref 80.0–100.0)

## 2015-06-11 LAB — CARBOXYHEMOGLOBIN
CARBOXYHEMOGLOBIN: 0.6 % (ref 0.5–1.5)
Methemoglobin: 1.1 % (ref 0.0–1.5)
O2 Saturation: 51.4 %
Total hemoglobin: 9.6 g/dL — ABNORMAL LOW (ref 12.0–16.0)

## 2015-06-11 LAB — BASIC METABOLIC PANEL
ANION GAP: 16 — AB (ref 5–15)
Anion gap: 13 (ref 5–15)
BUN: 67 mg/dL — AB (ref 6–20)
BUN: 16 mg/dL (ref 6–20)
CALCIUM: 7.2 mg/dL — AB (ref 8.9–10.3)
CALCIUM: 7.9 mg/dL — AB (ref 8.9–10.3)
CHLORIDE: 100 mmol/L — AB (ref 101–111)
CO2: 14 mmol/L — AB (ref 22–32)
CO2: 23 mmol/L (ref 22–32)
Chloride: 99 mmol/L — ABNORMAL LOW (ref 101–111)
Creatinine, Ser: 2.66 mg/dL — ABNORMAL HIGH (ref 0.44–1.00)
Creatinine, Ser: 0.78 mg/dL (ref 0.44–1.00)
GFR calc Af Amer: >60 mL/min (ref >60)
GFR calc non Af Amer: 16 mL/min — ABNORMAL LOW (ref >60)
GFR calc non Af Amer: >60 mL/min (ref >60)
GFR, EST AFRICAN AMERICAN: 19 mL/min — AB (ref >60)
GLUCOSE: 132 mg/dL — AB (ref 65–99)
Glucose, Bld: 139 mg/dL — ABNORMAL HIGH (ref 65–99)
Potassium: 4.4 mmol/L (ref 3.5–5.1)
Potassium: 4.2 mmol/L (ref 3.5–5.1)
Sodium: 129 mmol/L — ABNORMAL LOW (ref 135–145)
Sodium: 136 mmol/L (ref 135–145)

## 2015-06-11 LAB — CBC
HEMATOCRIT: 27 % — AB (ref 36.0–46.0)
HEMATOCRIT: 27.2 % — AB (ref 36.0–46.0)
HEMOGLOBIN: 9.5 g/dL — AB (ref 12.0–15.0)
Hemoglobin: 9.6 g/dL — ABNORMAL LOW (ref 12.0–15.0)
MCH: 29.1 pg (ref 26.0–34.0)
MCH: 29.2 pg (ref 26.0–34.0)
MCHC: 35.2 g/dL (ref 30.0–36.0)
MCHC: 35.3 g/dL (ref 30.0–36.0)
MCV: 82.6 fL (ref 78.0–100.0)
MCV: 82.7 fL (ref 78.0–100.0)
PLATELETS: 242 10*3/uL (ref 150–400)
Platelets: 238 10*3/uL (ref 150–400)
RBC: 3.27 MIL/uL — ABNORMAL LOW (ref 3.87–5.11)
RBC: 3.29 MIL/uL — ABNORMAL LOW (ref 3.87–5.11)
RDW: 15.5 % (ref 11.5–15.5)
RDW: 15.5 % (ref 11.5–15.5)
WBC: 18.7 10*3/uL — AB (ref 4.0–10.5)
WBC: 22.6 10*3/uL — ABNORMAL HIGH (ref 4.0–10.5)

## 2015-06-11 LAB — GLUCOSE, CAPILLARY
GLUCOSE-CAPILLARY: 108 mg/dL — AB (ref 65–99)
GLUCOSE-CAPILLARY: 122 mg/dL — AB (ref 65–99)
GLUCOSE-CAPILLARY: 133 mg/dL — AB (ref 65–99)
GLUCOSE-CAPILLARY: 138 mg/dL — AB (ref 65–99)
Glucose-Capillary: 159 mg/dL — ABNORMAL HIGH (ref 65–99)
Glucose-Capillary: 136 mg/dL — ABNORMAL HIGH (ref 65–99)

## 2015-06-11 LAB — POCT ACTIVATED CLOTTING TIME
ACTIVATED CLOTTING TIME: 122 s
ACTIVATED CLOTTING TIME: 153 s
ACTIVATED CLOTTING TIME: 159 s
ACTIVATED CLOTTING TIME: 165 s
ACTIVATED CLOTTING TIME: 177 s
ACTIVATED CLOTTING TIME: 202 s
ACTIVATED CLOTTING TIME: 227 s
ACTIVATED CLOTTING TIME: 233 s
ACTIVATED CLOTTING TIME: 233 s
ACTIVATED CLOTTING TIME: 245 s
ACTIVATED CLOTTING TIME: 245 s
ACTIVATED CLOTTING TIME: 251 s
ACTIVATED CLOTTING TIME: 263 s
Activated Clotting Time: 128 seconds
Activated Clotting Time: 140 seconds
Activated Clotting Time: 153 seconds
Activated Clotting Time: 140 seconds
Activated Clotting Time: 153 seconds
Activated Clotting Time: 153 seconds
Activated Clotting Time: 159 seconds
Activated Clotting Time: 159 seconds
Activated Clotting Time: 196 seconds
Activated Clotting Time: 202 seconds
Activated Clotting Time: 220 seconds
Activated Clotting Time: 227 seconds
Activated Clotting Time: 245 seconds
Activated Clotting Time: 245 seconds

## 2015-06-11 LAB — BLOOD GAS, ARTERIAL
ACID-BASE DEFICIT: 8.9 mmol/L — AB (ref 0.0–2.0)
Bicarbonate: 14.8 mEq/L — ABNORMAL LOW (ref 20.0–24.0)
Drawn by: 419771
FIO2: 0.4 %
MECHVT: 450 mL
O2 Saturation: 98.5 %
PATIENT TEMPERATURE: 98.6
PEEP: 5 cmH2O
PO2 ART: 116 mmHg — AB (ref 80.0–100.0)
RATE: 30 resp/min
TCO2: 15.6 mmol/L (ref 0–100)
pCO2 arterial: 23.9 mmHg — ABNORMAL LOW (ref 35.0–45.0)
pH, Arterial: 7.409 (ref 7.350–7.450)

## 2015-06-11 LAB — IMMUNOFIXATION ELECTROPHORESIS
IGA: 227 mg/dL (ref 64–422)
IGG (IMMUNOGLOBIN G), SERUM: 661 mg/dL — AB (ref 700–1600)
IGM, SERUM: 46 mg/dL (ref 26–217)
Total Protein ELP: 5.4 g/dL — ABNORMAL LOW (ref 6.0–8.5)

## 2015-06-11 LAB — HEPARIN LEVEL (UNFRACTIONATED)
Heparin Unfractionated: >2.2 IU/mL — ABNORMAL HIGH (ref 0.30–0.70)
Heparin Unfractionated: >2.2 IU/mL — ABNORMAL HIGH (ref 0.30–0.70)

## 2015-06-11 LAB — PROTIME-INR
INR: 1.97 — ABNORMAL HIGH (ref 0.00–1.49)
PROTHROMBIN TIME: 22.3 s — AB (ref 11.6–15.2)

## 2015-06-11 LAB — MAGNESIUM: Magnesium: 2.1 mg/dL (ref 1.7–2.4)

## 2015-06-11 LAB — PHOSPHORUS: Phosphorus: 4.2 mg/dL (ref 2.5–4.6)

## 2015-06-11 LAB — APTT: APTT: 56 s — AB (ref 24–37)

## 2015-06-11 MED ORDER — SODIUM CHLORIDE 0.9 % IV SOLN
25.0000 ug/h | INTRAVENOUS | Status: DC
  Filled 2015-06-11: qty 50

## 2015-06-11 MED ORDER — HEPARIN (PORCINE) IN NACL 100-0.45 UNIT/ML-% IJ SOLN
700.0000 [IU]/h | INTRAMUSCULAR | Status: DC
  Filled 2015-06-11: qty 250

## 2015-06-11 MED ORDER — DOBUTAMINE IN D5W 4-5 MG/ML-% IV SOLN: 5.0000 ug/kg/min | INTRAVENOUS | Status: DC

## 2015-06-11 MED ORDER — NOREPINEPHRINE BITARTRATE 1 MG/ML IV SOLN
0.0000 ug/min | INTRAVENOUS | Status: DC
  Administered 2015-06-12: 70 ug/min via INTRAVENOUS
  Filled 2015-06-11: qty 16

## 2015-06-11 MED ORDER — AMIODARONE LOAD VIA INFUSION
150.0000 mg | Freq: Once | INTRAVENOUS | Status: DC
  Filled 2015-06-11: qty 83.34

## 2015-06-11 MED ORDER — PANTOPRAZOLE SODIUM 40 MG PO PACK
40.0000 mg | PACK | ORAL | Status: DC
  Administered 2015-06-11: 40 mg
  Filled 2015-06-11 (×2): qty 20

## 2015-06-11 MED ORDER — HEPARIN (PORCINE) IN NACL 100-0.45 UNIT/ML-% IJ SOLN
900.0000 [IU]/h | INTRAMUSCULAR | Status: DC
  Administered 2015-06-11: 900 [IU]/h via INTRAVENOUS
  Filled 2015-06-11: qty 250

## 2015-06-11 MED ORDER — AMIODARONE HCL IN DEXTROSE 360-4.14 MG/200ML-% IV SOLN
30.0000 mg/h | INTRAVENOUS | Status: DC
  Administered 2015-06-11 (×2): 30 mg/h via INTRAVENOUS
  Filled 2015-06-11 (×8): qty 200

## 2015-06-11 MED ORDER — AMIODARONE LOAD VIA INFUSION
300.0000 mg | Freq: Once | INTRAVENOUS | Status: AC
  Administered 2015-06-11: 300 mg via INTRAVENOUS
  Filled 2015-06-11: qty 166.67

## 2015-06-11 MED ORDER — AMIODARONE LOAD VIA INFUSION
150.0000 mg | Freq: Once | INTRAVENOUS | Status: AC
  Administered 2015-06-11: 150 mg via INTRAVENOUS
  Filled 2015-06-11: qty 83.34

## 2015-06-11 MED ORDER — DOBUTAMINE IN D5W 4-5 MG/ML-% IV SOLN
INTRAVENOUS | Status: AC
  Filled 2015-06-11: qty 250

## 2015-06-11 MED ORDER — PHENYLEPHRINE HCL 10 MG/ML IJ SOLN
0.0000 ug/min | INTRAVENOUS | Status: DC
  Filled 2015-06-11: qty 1

## 2015-06-11 NOTE — Progress Notes (Addendum)
New EKG obtained d/t rhythm looking more irregular.  Sustaining 130s-140s.  Fellow paged.  Will continue to to monitor.

## 2015-06-11 NOTE — Progress Notes (Signed)
Pt went into a tachycardia rhythm at this time.  MD paged.  EKG done.  Fellow to bedside.

## 2015-06-11 NOTE — Progress Notes (Signed)
Advanced Heart Failure Rounding Note   Subjective:    81 y/o woman well known to me from recent prolonged admission in April 2016 with severe HTN, decompensated systolic HF with EF 62-70%, moderate CAD and stage 4 CKD with baseline creatinine 1.6-2.0. During that admission had several episodes of flash pulmonary edema in setting of severe HTN. Cath with severely calcified coronaries and question of significant high-grade LM lesion however IVUS showed it to be 60%. Cath also showed high-grade right RAS which was stented. Hospitalization c/b AKI with peak creatinine 3.3.  She was discharged to SNF and was doing well until recently when she was found to have possible tooth abscess. Saw a dentist who put her on clindamycin but was awaiting cardiology clearance to proceed given plavix. Came to ER with nausea, vomiting, poor appetite, generalized weakness, dental pain. Denied F/C. Found to be hypotensive with SBP 80s.   Admitted for possible dehydration. IVF started and she developed CHF. Cr 1.9-> 2.9-> 3.3. WBC 12.5. BNP 3120 (baseline 320). Weigh 153 on admit (was 151 on 7/6). Cardiology consulted and started norepi and moved to ICU.   Yesterday she started on CRRT. She continued on Vanc and Zosyn for tooth abcess. UA with many yeast and she was given 200 mg diflucan.    Cr 1.9-> 2.9-> 3.3>4.02>2.6  WBC 12>16 >pending.   Objective:   Weight Range:  Vital Signs:   Temp:  [97.6 F (36.4 C)-100.8 F (38.2 C)] 97.6 F (36.4 C) (07/19 0400) Pulse Rate:  [39-143] 143 (07/19 0741) Resp:  [22-30] 22 (07/19 0800) BP: (87-106)/(51-53) 87/51 mmHg (07/19 0741) SpO2:  [96 %-100 %] 99 % (07/19 0741) Arterial Line BP: (79-137)/(42-59) 102/58 mmHg (07/19 0800) FiO2 (%):  [40 %] 40 % (07/19 0741) Weight:  [162 lb 4.1 oz (73.6 kg)] 162 lb 4.1 oz (73.6 kg) (07/19 0500) Last BM Date:  (PTA)  Weight change: Filed Weights   06/09/15 1130 06/10/15 0500 06/11/15 0500  Weight: 161 lb 6 oz (73.2 kg) 162  lb 4.1 oz (73.6 kg) 162 lb 4.1 oz (73.6 kg)    Intake/Output:   Intake/Output Summary (Last 24 hours) at 06/11/15 0834 Last data filed at 06/11/15 0800  Gross per 24 hour  Intake 1768.45 ml  Output   1656 ml  Net 112.45 ml     Physical Exam: CVP 11  General:  Intubated. Awake. Pale HEENT: normal Neck: supple. JVP to jaw. Carotids 2+ bilat; no bruits. No lymphadenopathy or thryomegaly appreciated. RIJ  Cor: PMI nondisplaced. Regular rate & rhythm. No rubs, gallops or murmurs. Lungs: clear Abdomen: soft, nontender, nondistended. No hepatosplenomegaly. No bruits or masses. Good bowel sounds. Extremities: no cyanosis, clubbing, rash, R and LLE 1+ edema Neuro: Intubated. Follows commands. Moves all 4 extremities w/o difficulty.   Telemetry: A fib 130-140s    Labs: Basic Metabolic Panel:  Recent Labs Lab 06/09/15 2315 06/10/15 0420 06/10/15 1600 06/10/15 2142 06/11/15 0430 06/11/15 0500  NA 125* 128* 125*  --  129* 130*  K 5.6* 5.4* 5.0  --  4.4 4.4  CL 96* 98* 96*  --  99* 99*  CO2 16* 16* 12*  --  14* 14*  GLUCOSE 331* 279* 207*  --  139* 140*  BUN 109* 112* 121*  --  67* 66*  CREATININE 4.00* 4.02* 4.53*  --  2.66* 2.63*  CALCIUM 7.7* 7.5* 7.4*  --  7.2* 7.2*  MG  --   --   --  2.1 2.1  --  PHOS  --   --  6.3*  --  4.2 4.2    Liver Function Tests:  Recent Labs Lab 05/29/2015 1604 06/09/15 0709 06/10/15 1600 06/10/15 2142 06/11/15 0500  AST 36 54*  --  3251*  --   ALT 35 49  --  3029*  --   ALKPHOS 35* 40  --  80  --   BILITOT 0.4 0.6  --  1.3*  --   PROT 5.8* 5.7*  --  5.9*  --   ALBUMIN 2.6* 2.4* 2.2* 2.2* 2.1*   No results for input(s): LIPASE, AMYLASE in the last 168 hours. No results for input(s): AMMONIA in the last 168 hours.  CBC:  Recent Labs Lab 05/31/2015 1604 06/07/2015 1616 06/09/15 0709 06/10/15 0420 06/11/15 0430  WBC 11.3*  --  12.5* 16.1* 18.7*  HGB 8.3* 8.5* 8.1* 8.9* 9.5*  HCT 24.1* 25.0* 24.2* 26.1* 27.0*  MCV 87.6  --  86.7  85.0 82.6  PLT 227  --  240 269 242    Cardiac Enzymes:  Recent Labs Lab 05/27/2015 1614 06/16/2015 2041 06/09/15 0049 06/09/15 0709  TROPONINI 0.53* 0.72* 0.95* 1.93*    BNP: BNP (last 3 results)  Recent Labs  04/03/15 1210 04/09/15 1115 06/09/15 1155  BNP 807.1* 323.0* 3120.5*    ProBNP (last 3 results)  Recent Labs  03/01/15 0840  PROBNP 742.0*      Other results:  Imaging: Dg Abd 1 View  06/10/2015   CLINICAL DATA:  OG tube placement  EXAM: ABDOMEN - 1 VIEW  COMPARISON:  06/10/2015  FINDINGS: OG tube tip is in the mid stomach. Gallstones noted within the gallbladder. Nonobstructive bowel gas pattern.  IMPRESSION: OG tube tip in the mid stomach.   Electronically Signed   By: Rolm Baptise M.D.   On: 06/10/2015 10:43   US Renal Port  06/10/2015   CLINICAL DATA:  Acute kidney injury, history type II diabetes mellitus, hypertension, CHF, cardiomyopathy  EXAM: RENAL / URINARY TRACT ULTRASOUND COMPLETE  COMPARISON:  02/15/2015  FINDINGS: Right Kidney:  Length: 10.8 cm. Normal cortical thickness. Borderline increased renal cortical echogenicity. No mass, hydronephrosis or shadowing calcification.  Left Kidney:  Length: 10.7 cm. Cortical thinning. Increased cortical echogenicity. No mass, hydronephrosis or shadowing calcification.  Bladder:  Decompressed by Foley catheter, unable to evaluate.  Incidentally noted LEFT pleural effusion and cholelithiasis.  IMPRESSION: Medical renal disease changes.  No evidence of renal mass or hydronephrosis.  LEFT pleural effusion.  Cholelithiasis.   Electronically Signed   By: Lavonia Dana M.D.   On: 06/10/2015 21:43   Dg Chest Port 1 View  06/11/2015   CLINICAL DATA:  Intubation.  EXAM: PORTABLE CHEST - 1 VIEW  COMPARISON:  06/10/2015.  FINDINGS: Endotracheal tube, NG tube, right IJ line in stable position. Cardiomegaly with bilateral interstitial prominence and small right pleural effusion consistent with congestive heart failure. There has  been interim improvement of pulmonary interstitial edema from prior exam. No pneumothorax.  IMPRESSION: 1. Lines and tubes in stable position 2. Interim partial clearing of congestive heart failure and pulmonary interstitial edema. Small right pleural effusion.   Electronically Signed   By: Marcello Moores  Register   On: 06/11/2015 07:15   Dg Chest Port 1 View  06/10/2015   CLINICAL DATA:  Endotracheal tube placement.  Initial encounter.  EXAM: PORTABLE CHEST - 1 VIEW  COMPARISON:  Chest radiograph performed 06/09/2015  FINDINGS: The patient's endotracheal tube is seen ending 5 cm above the carina.  An enteric tube is noted extending below the diaphragm. A right IJ line is noted ending about the mid SVC.  Vascular congestion is noted. Left basilar airspace opacity is noted. Increased interstitial markings raise concern for mild pulmonary edema. A small left pleural effusion is suspected. No pneumothorax is identified.  The cardiomediastinal silhouette is normal in size. No acute osseous abnormalities are identified.  IMPRESSION: 1. Endotracheal tube seen ending 5 cm above the carina. 2. Vascular congestion noted. Increased interstitial markings raise concern for mild pulmonary edema. Small left pleural effusion suspected.   Electronically Signed   By: Garald Balding M.D.   On: 06/10/2015 04:52   Dg Chest Port 1 View  06/09/2015   CLINICAL DATA:  81 year old female status post central line placement  EXAM: PORTABLE CHEST - 1 VIEW  COMPARISON:  Prior chest x-ray 06/09/2015  FINDINGS: New right IJ approach central venous catheter. Catheter tip projects over the distal SVC. No evidence of pneumothorax or hemothorax. Similar appearance of cardiac and mediastinal contours. There is prominence of the right mediastinum concerning for underlying mass or adenopathy. Slightly decreased interstitial edema. Persistent bibasilar atelectasis.  IMPRESSION: 1. The tip of the new right IJ approach central venous catheter projects over  the distal SVC. No evidence of pneumothorax or other complication. 2. Improving interstitial edema. 3. Persistent bibasilar atelectasis. 4. Prominence of the right hilum concerning for underlying adenopathy or mass. Consider further evaluation with non emergent CT scan of the chest with contrast.   Electronically Signed   By: Jacqulynn Cadet M.D.   On: 06/09/2015 13:47   Dg Chest Port 1 View  06/09/2015   CLINICAL DATA:  CHF.  Low blood pressure.  EXAM: PORTABLE CHEST - 1 VIEW  COMPARISON:  04/02/2015  FINDINGS: Lungs are hyperinflated. The heart is enlarged. There are perihilar changes of pulmonary edema. More confluent opacity at the left base may represent edema or infectious process. Suspect left pleural effusion.  IMPRESSION: 1. Changes consistent with pulmonary edema. 2. Left lower lobe infiltrate or confluent edema.   Electronically Signed   By: Nolon Nations M.D.   On: 06/09/2015 10:37   Dg Abd Portable 1v  06/10/2015   CLINICAL DATA:  Orogastric tube placement.  Initial encounter.  EXAM: PORTABLE ABDOMEN - 1 VIEW  COMPARISON:  None.  FINDINGS: The patient's enteric tube is noted ending overlying the gastroesophageal junction, with the side port at the distal esophagus. This could be advanced another 14 cm.  The stomach contains air. The visualized bowel gas pattern is grossly unremarkable. Gallstones are seen overlying the right upper quadrant. No acute osseous abnormalities are identified.  IMPRESSION: Enteric tube noted ending overlying the gastroesophageal junction, with the side port at the distal esophagus. This could be advanced another 14 cm.   Electronically Signed   By: Garald Balding M.D.   On: 06/10/2015 04:55     Medications:     Scheduled Medications: . antiseptic oral rinse  7 mL Mouth Rinse QID  . aspirin  81 mg Oral Daily  . atorvastatin  10 mg Oral QPM  . chlorhexidine  15 mL Mouth Rinse BID  . clopidogrel  75 mg Oral Daily  . feeding supplement (PRO-STAT SUGAR FREE  64)  30 mL Per Tube Daily  . heparin  5,000 Units Subcutaneous 3 times per day  . insulin aspart  0-20 Units Subcutaneous 6 times per day  . pantoprazole (PROTONIX) IV  40 mg Intravenous Q24H  . piperacillin-tazobactam (ZOSYN)  IV  2.25 g Intravenous 4 times per day  . saccharomyces boulardii  250 mg Oral QHS  . sodium chloride  10-40 mL Intracatheter Q12H  . sodium chloride  3 mL Intravenous Q12H  . vancomycin  750 mg Intravenous Q24H    Infusions: . feeding supplement (VITAL AF 1.2 CAL) 1,000 mL (06/11/15 0800)  . heparin 10,000 units/ 20 mL infusion syringe 1,600 Units/hr (06/11/15 0800)  . norepinephrine (LEVOPHED) Adult infusion 40 mcg/min (06/11/15 0800)  . phenylephrine (NEO-SYNEPHRINE) Adult infusion    . dialysis replacement fluid (prismasate) 800 mL/hr at 06/11/15 0256  . dialysis replacement fluid (prismasate) 800 mL/hr at 06/11/15 0717  . dialysate (PRISMASATE) 2,000 mL/hr at 06/11/15 0717  . vasopressin (PITRESSIN) infusion - *FOR SHOCK* 0.03 Units/min (06/11/15 0800)    PRN Medications: Place/Maintain arterial line **AND** sodium chloride, acetaminophen **OR** acetaminophen, albuterol, fentaNYL (SUBLIMAZE) injection, heparin, heparin, heparin, midazolam, morphine injection, ondansetron **OR** ondansetron (ZOFRAN) IV, sodium chloride     Assessment:  1) Shock cardiogenic vs septic 2) Acute on chronic systolic HF 3) Acute respiratory failure 4) A/C renal failure stage 4 5) Elevated troponin - suspect HF +/- demand ischemia 6) Anemia of chronic disease 7) Acute Respiratory Failure--> Intubated 7/17 8) Uncontrolled DM 9) Elevated WBC--Blood cultures pending  10) UTI- UA many yeast-  11) A Fib RVR  12) Elevated AST/ALT 3251/3029 due to shock liver   Plan/Discussion:   Remains intubated last night . CCM appreciated.   CO-OX falling to 51%. On Norepi and Vaso. Now on Noreip at 42 mcg , vasopressin 0.03 units. Remains hypotension--sepsis vs cardiogenic shock.   A  fib RVR-  Aware of elevated AST/ALT. Stop statin. Start amio with 150 mg bolus then  30 mg per hour. [place] VASC Score 5. Check INR now and likely start heparin.   Check CBC now.  On Vanc and zosyn. Also received a dose of diflucan.   Renal function coming down. Day 2 CRRT.   No BB with hypotension. SPEP UPEP pending. Concern for amyloid.   Glucose better controlled.  Changed to resistant sliding scale.    Length of Stay: 3 CLEGG,AMY  NP-C   06/11/2015, 8:34 AM  Advanced Heart Failure Team Pager [phone] (M-F; [place])  Please contact [facility] for night-coverage after hours (4p -7a ) and weekends on [email or web address]    Patient seen and examined with Darrick Grinder, NP. We discussed all aspects of the encounter. I agree with the assessment and plan as stated above.   She remains critically ill with multisystem organ failure. Remains intubated on CVVHD and multiple pressors bu hemodynamics still marginal. Developed AF overnight and now on amio gtt. Cultures remain negative. Prognosis increasingly concerning. Will continue norepi and vasopressin as well as broad spectrum abx. Discussed with husband and son and we will give another 48 hours of aggressive support and then reassess.   The patient is critically ill with multiple organ systems failure and requires high complexity decision making for assessment and support, frequent evaluation and titration of therapies, application of advanced monitoring technologies and extensive interpretation of multiple databases.   Critical Care Time devoted to patient care services described in this note is 35 Minutes.  Bensimhon, Daniel,MD 6:10 PM

## 2015-06-11 NOTE — Progress Notes (Signed)
Responded to request to assist patient with completing advance directives.  Documents were completed and the original copy and two other  copies were given to patients husband.  One copy was given to patient nurse for chart.  Patient was dozzing in and out of sleep.  Husband at bedside and son is expected to return later.   06/11/15 1400  Clinical Encounter Type  Visited With Patient and family together;Health care provider  Visit Type Initial;Spiritual support  Referral From Family;Nurse  Spiritual Encounters  Spiritual Needs Emotional;Other (Comment) (Advance directives)  Advance Directives (For Healthcare)  Does patient have an advance directive? Yes  Jaclynn Major, [place]

## 2015-06-11 NOTE — Progress Notes (Signed)
PULMONARY  / CRITICAL CARE MEDICINE CONSULTATION   Name: Katie Fuentes MRN: [id number] DOB: [birth year]    ADMISSION DATE:  05/31/2015 CONSULTATION DATE: June 11, 2015  REQUESTING CLINICIAN: Jolaine Artist, MD PRIMARY SERVICE: Cardiology  CHIEF COMPLAINT:  "I don't feel well"  BRIEF PATIENT DESCRIPTION: 81 y/o woman with significant CV disease (including CAD as well as nonischemic cardiomyopathy, and RAS s/p stent placement) who developed sepsis due to infected tooth.  SIGNIFICANT EVENTS / STUDIES:  7/17 echo >> worsening LV function 7/18 - new afib started, amiodarone bolus given with no improvement 7/18 - CRT started  LINES / TUBES: CVC in R IJ 7/18>>> ETT 7/18>>> R radial a-line 7/18>>> PIVs Foley 7/16>>> CVVHD 7/18 >>  CULTURES: Blood 7/18>>>NTD  ANTIBIOTICS: Pip-tazo 06/09/15>>> Vanc 06/09/15>>> Fluconazole x1 7/17  HISTORY OF PRESENT ILLNESS:  Katie Fuentes is a 81 y/o woman with a history of significant CV disease (CAD, RAS s/p stent, chronic nonischemic cardiomyopathy) who presented to the ED with complaints of nausea, vomiting, and malaise in the setting of a toothache and a dental exam suggestive of a dental abscess. She had presented to her dentist who recommended extraction, but held off due to her plavix. She then began to feel generally ill with nausea and vomiting and presented to the ED. She was found to be hypotensive with acute decline in renal function. She was admitted to cardiology who began to workup her shock. Given her high WBC and clinical history of a toothache, sepsis was considered and PCCM called to better evaluate. Initially, her Co-Ox was higher than would be imaged for someone with severe heart disease (near-60%), but then declined to low 50s. Given her complexity and concern for septic component, PCCM was called to assist on consultation.  SUBJECTIVE: Received amiodarone bolus overnight for new afib with RVR, CVVHD started last night.  Husband at bedside says they had a good night, she slept well  VITAL SIGNS: Temp:  [97.6 F (36.4 C)-100.8 F (38.2 C)] 97.6 F (36.4 C) (07/19 0400) Pulse Rate:  [39-143] 143 (07/19 0741) Resp:  [22-30] 22 (07/19 0800) BP: (87-106)/(51-53) 87/51 mmHg (07/19 0741) SpO2:  [96 %-100 %] 99 % (07/19 0741) Arterial Line BP: (79-137)/(42-59) 102/58 mmHg (07/19 0800) FiO2 (%):  [40 %] 40 % (07/19 0741) Weight:  [162 lb 4.1 oz (73.6 kg)] 162 lb 4.1 oz (73.6 kg) (07/19 0500)  HEMODYNAMICS: CVP:  [9 mmHg-12 mmHg] 9 mmHg  VENTILATOR SETTINGS: Vent Mode:  [-] PRVC FiO2 (%):  [40 %] 40 % Set Rate:  [30 bmp] 30 bmp Vt Set:  [450 mL] 450 mL PEEP:  [5 cmH20] 5 cmH20 Plateau Pressure:  [14 cmH20-20 cmH20] 14 cmH20  INTAKE / OUTPUT: Intake/Output      07/18 0701 - 07/19 0700 07/19 0701 - 07/20 0700   P.O. 0    I.V. (mL/kg) 831.1 (11.3) 48.8 (0.7)   Other 90    NG/GT 369.6 35   IV Piggyback 416    Total Intake(mL/kg) 1706.7 (23.2) 83.8 (1.1)   Urine (mL/kg/hr) 880 (0.5)    Emesis/NG output 140 (0.1)    Other 599 (0.3) 117 (0.8)   Total Output 1619 117   Net +87.7 -33.2         PHYSICAL EXAMINATION: General:  Pale woman intubated but interactive, able to write notes to communicate Neuro:  Intact, moving all ext to command, not sedated. HEENT:  [place]/AT, PERRL, EOM-I and MMM Neck: +JVD but no tenderness. Cardiovascular: Tachycardic, no appreciable  r/m/g Lungs:  Coarse BS diffusely. Abdomen:  Soft, non-tender, non-distended and +BS. Musculoskeletal:  -edema and -tenderness. Skin:  Intact but thin.  LABS:  CBC  Recent Labs Lab 06/09/15 0709 06/10/15 0420 06/11/15 0430  WBC 12.5* 16.1* 18.7*  HGB 8.1* 8.9* 9.5*  HCT 24.2* 26.1* 27.0*  PLT 240 269 242   Coag's  Recent Labs Lab 06/11/15 0430  APTT 56*   BMET  Recent Labs Lab 06/10/15 1600 06/11/15 0430 06/11/15 0500  NA 125* 129* 130*  K 5.0 4.4 4.4  CL 96* 99* 99*  CO2 12* 14* 14*  BUN 121* 67* 66*   CREATININE 4.53* 2.66* 2.63*  GLUCOSE 207* 139* 140*   Electrolytes  Recent Labs Lab 06/10/15 1600 06/10/15 2142 06/11/15 0430 06/11/15 0500  CALCIUM 7.4*  --  7.2* 7.2*  MG  --  2.1 2.1  --   PHOS 6.3*  --  4.2 4.2   Sepsis Markers  Recent Labs Lab 06/09/15 1440 06/10/15 0019 06/10/15 0320  LATICACIDVEN 1.3 1.9 1.5   ABG  Recent Labs Lab 06/10/15 0552 06/10/15 0820 06/11/15 0438  PHART 7.249* 7.321* 7.409  PCO2ART 33.6* 24.1* 23.9*  PO2ART 101.0* 108.0* 116*   Liver Enzymes  Recent Labs Lab 06/11/2015 1604 06/09/15 0709 06/10/15 1600 06/10/15 2142 06/11/15 0500  AST 36 54*  --  3251*  --   ALT 35 49  --  3029*  --   ALKPHOS 35* 40  --  80  --   BILITOT 0.4 0.6  --  1.3*  --   ALBUMIN 2.6* 2.4* 2.2* 2.2* 2.1*   Cardiac Enzymes  Recent Labs Lab 06/20/2015 2041 06/09/15 0049 06/09/15 0709  TROPONINI 0.72* 0.95* 1.93*   Glucose  Recent Labs Lab 06/10/15 0739 06/10/15 1134 06/10/15 1558 06/10/15 1951 06/10/15 2358 06/11/15 0430  GLUCAP 266* 207* 187* 159* 133* 138*    Imaging Dg Abd 1 View  06/10/2015   CLINICAL DATA:  OG tube placement  EXAM: ABDOMEN - 1 VIEW  COMPARISON:  06/10/2015  FINDINGS: OG tube tip is in the mid stomach. Gallstones noted within the gallbladder. Nonobstructive bowel gas pattern.  IMPRESSION: OG tube tip in the mid stomach.   Electronically Signed   By: Rolm Baptise M.D.   On: 06/10/2015 10:43   US Renal Port  06/10/2015   CLINICAL DATA:  Acute kidney injury, history type II diabetes mellitus, hypertension, CHF, cardiomyopathy  EXAM: RENAL / URINARY TRACT ULTRASOUND COMPLETE  COMPARISON:  02/15/2015  FINDINGS: Right Kidney:  Length: 10.8 cm. Normal cortical thickness. Borderline increased renal cortical echogenicity. No mass, hydronephrosis or shadowing calcification.  Left Kidney:  Length: 10.7 cm. Cortical thinning. Increased cortical echogenicity. No mass, hydronephrosis or shadowing calcification.  Bladder:   Decompressed by Foley catheter, unable to evaluate.  Incidentally noted LEFT pleural effusion and cholelithiasis.  IMPRESSION: Medical renal disease changes.  No evidence of renal mass or hydronephrosis.  LEFT pleural effusion.  Cholelithiasis.   Electronically Signed   By: Lavonia Dana M.D.   On: 06/10/2015 21:43   Dg Chest Port 1 View  06/11/2015   CLINICAL DATA:  Intubation.  EXAM: PORTABLE CHEST - 1 VIEW  COMPARISON:  06/10/2015.  FINDINGS: Endotracheal tube, NG tube, right IJ line in stable position. Cardiomegaly with bilateral interstitial prominence and small right pleural effusion consistent with congestive heart failure. There has been interim improvement of pulmonary interstitial edema from prior exam. No pneumothorax.  IMPRESSION: 1. Lines and tubes in stable position 2.  Interim partial clearing of congestive heart failure and pulmonary interstitial edema. Small right pleural effusion.   Electronically Signed   By: Marcello Moores  Register   On: 06/11/2015 07:15   Dg Chest Port 1 View  06/10/2015   CLINICAL DATA:  Endotracheal tube placement.  Initial encounter.  EXAM: PORTABLE CHEST - 1 VIEW  COMPARISON:  Chest radiograph performed 06/09/2015  FINDINGS: The patient's endotracheal tube is seen ending 5 cm above the carina. An enteric tube is noted extending below the diaphragm. A right IJ line is noted ending about the mid SVC.  Vascular congestion is noted. Left basilar airspace opacity is noted. Increased interstitial markings raise concern for mild pulmonary edema. A small left pleural effusion is suspected. No pneumothorax is identified.  The cardiomediastinal silhouette is normal in size. No acute osseous abnormalities are identified.  IMPRESSION: 1. Endotracheal tube seen ending 5 cm above the carina. 2. Vascular congestion noted. Increased interstitial markings raise concern for mild pulmonary edema. Small left pleural effusion suspected.   Electronically Signed   By: Garald Balding M.D.   On:  06/10/2015 04:52   Dg Chest Port 1 View  06/09/2015   CLINICAL DATA:  81 year old female status post central line placement  EXAM: PORTABLE CHEST - 1 VIEW  COMPARISON:  Prior chest x-ray 06/09/2015  FINDINGS: New right IJ approach central venous catheter. Catheter tip projects over the distal SVC. No evidence of pneumothorax or hemothorax. Similar appearance of cardiac and mediastinal contours. There is prominence of the right mediastinum concerning for underlying mass or adenopathy. Slightly decreased interstitial edema. Persistent bibasilar atelectasis.  IMPRESSION: 1. The tip of the new right IJ approach central venous catheter projects over the distal SVC. No evidence of pneumothorax or other complication. 2. Improving interstitial edema. 3. Persistent bibasilar atelectasis. 4. Prominence of the right hilum concerning for underlying adenopathy or mass. Consider further evaluation with non emergent CT scan of the chest with contrast.   Electronically Signed   By: Jacqulynn Cadet M.D.   On: 06/09/2015 13:47   Dg Chest Port 1 View  06/09/2015   CLINICAL DATA:  CHF.  Low blood pressure.  EXAM: PORTABLE CHEST - 1 VIEW  COMPARISON:  04/02/2015  FINDINGS: Lungs are hyperinflated. The heart is enlarged. There are perihilar changes of pulmonary edema. More confluent opacity at the left base may represent edema or infectious process. Suspect left pleural effusion.  IMPRESSION: 1. Changes consistent with pulmonary edema. 2. Left lower lobe infiltrate or confluent edema.   Electronically Signed   By: Nolon Nations M.D.   On: 06/09/2015 10:37   Dg Abd Portable 1v  06/10/2015   CLINICAL DATA:  Orogastric tube placement.  Initial encounter.  EXAM: PORTABLE ABDOMEN - 1 VIEW  COMPARISON:  None.  FINDINGS: The patient's enteric tube is noted ending overlying the gastroesophageal junction, with the side port at the distal esophagus. This could be advanced another 14 cm.  The stomach contains air. The visualized bowel  gas pattern is grossly unremarkable. Gallstones are seen overlying the right upper quadrant. No acute osseous abnormalities are identified.  IMPRESSION: Enteric tube noted ending overlying the gastroesophageal junction, with the side port at the distal esophagus. This could be advanced another 14 cm.   Electronically Signed   By: Garald Balding M.D.   On: 06/10/2015 04:55    EKG: Atrial fibrillation with RVR CXR: Mild volume overload as expressed by tracer interstitial edema. Small right pleural effusion  ASSESSMENT / PLAN:  PULMONARY A:  Mild pulmonary edema Hypoxia Small R pleural effusion P:   - Full vent support for now.   - ABG and CXR in AM. - VAP preventions. - Titrate O2 for sat of 88-92%.  CARDIOVASCULAR A: Acute on chronic systolic heart failure. New atrial fibrillation, likely due to electrolyte/fluid shifts and catechol administration P:   - Levophed for MAP of 65 mmHg (at 63mcg/min currently). - Start neosynephrine and attempt to decrease levo to address tachycardia. - Vasopressin (at 0.03 ml/hr currently). - Follow CVP. - Cards recommend starting heparin and amiodarone drip.   - Plan to minimize catechols as able due to new tachycardia. - CVVHD started 7/18.   RENAL A: AKI - Cr now plateaued but UOP is poor. P:   - Renal following. - Replace electrolytes as indicated. - Monitor UOP. - Continue hydration. - CVVH per renal.  GASTROINTESTINAL A: Low albumin P:   - Continue TF per nutrition.  HEMATOLOGIC A: Anemia, (hemoglobin 9.6, 7/19) P:   - Will follow up, transfuse for Hb <7.0. - CBC in AM.  INFECTIOUS A: Septic shock, likely due to oral source P:   - Once improved will need dental extractions. - Blood culture 7/18>>> - Vancomycin 7/18>>> 3/x - Zosyn 7/18>>> 3/x  ENDOCRINE A: DM2 P:   - Maintain glycemic control per CCM protocol.  NEUROLOGIC A: No active issues P:   - Intubated but not sedated - Start fentanyl drip and PRN  versed.  The patient is critically ill with multiple organ systems failure and requires high complexity decision making for assessment and support, frequent evaluation and titration of therapies, application of advanced monitoring technologies and extensive interpretation of multiple databases.   Critical Care Time devoted to patient care services described in this note is  35  Minutes. This time reflects time of care of this signee Dr Jennet Maduro. This critical care time does not reflect procedure time, or teaching time or supervisory time of PA/NP/Med student/Med Resident etc but could involve care discussion time.  Rush Farmer, M.D. [facility] Pulmonary/Critical Care Medicine. Pager: [phone]. After hours pager: [phone].  06/11/2015, 8:56 AM

## 2015-06-11 NOTE — Significant Event (Addendum)
Paged to review tele due to rates in the 160s at times. This occurred shortly after starting CVVHD.   Tele reviewed. ECG obtained in tachyarrhythmia. Onset is not completely abrupt; some acceleration is noted. Rate at peak is also not completely stable. Similarly, offset has a decel component. Although p waves are difficult to discern, this is most c/w an AT. Cannot rule out periods of AF. Levophed rate has varied slightly but not definitely related to HR swings.   Electrolytes were reviewed and are adequate.   Suspect this is related to electrolyte and fluid shifts with CVVHD and exacerbated by catechol administration.   A 150mg  amiodarone bolus was given x1 and LFTs were send. They unfortunately returned >3k so ongoing amio use is not an option.   Will plan to minimize cathechols as able. She is now on vaso as well. Can consider either DCCV or addition of phenyl which will probably reduce HR (but could worsen the cardiogenic component of the mixed shock.    452am addendum Levophed up to 40. HRs in 130s. If additional pressor is needed, will trial low dose neosynephrine in efforts to try to avoid exacerbating the tachycardia.

## 2015-06-11 NOTE — Progress Notes (Signed)
Subjective: Intubated, in no distress. States she is having neck and back pain that is a chronic issue for her per husband.   Objective: Vital signs in last 24 hours: Temp:  [97.6 F (36.4 C)-100.8 F (38.2 C)] 97.6 F (36.4 C) (07/19 0400) Pulse Rate:  [39-143] 143 (07/19 0741) Resp:  [3-30] 30 (07/19 0741) BP: (87-125)/(51-58) 87/51 mmHg (07/19 0741) SpO2:  [96 %-100 %] 99 % (07/19 0741) Arterial Line BP: (79-142)/(42-69) 93/55 mmHg (07/19 0600) FiO2 (%):  [40 %] 40 % (07/19 0741) Weight:  [162 lb 4.1 oz (73.6 kg)] 162 lb 4.1 oz (73.6 kg) (07/19 0500) Weight change: 14.1 oz (0.4 kg)  Intake/Output from previous day: 07/18 0701 - 07/19 0700 In: 1706.7 [I.V.:831.1; NG/GT:369.6; IV Piggyback:416] Out: 1619 [Urine:880; Emesis/NG output:140] Intake/Output this shift:    Physical Exam  Constitutional: She appears well-developed and well-nourished.  HENT:  ETT in place   Eyes: Conjunctivae are normal.  Cardiovascular: Normal rate and regular rhythm.   Pulmonary/Chest: Effort normal and breath sounds normal.  Abdominal: Soft. Bowel sounds are normal. She exhibits no distension. There is no tenderness.  Musculoskeletal: She exhibits no edema.  Ted hose on, warm extremities   Lungs, rales in bases,  CV irreg gr 2/6 M  Lab Results:  Recent Labs  06/10/15 0420 06/11/15 0430  WBC 16.1* 18.7*  HGB 8.9* 9.5*  HCT 26.1* 27.0*  PLT 269 242   BMET:  Recent Labs  06/11/15 0430 06/11/15 0500  NA 129* 130*  K 4.4 4.4  CL 99* 99*  CO2 14* 14*  GLUCOSE 139* 140*  BUN 67* 66*  CREATININE 2.66* 2.63*  CALCIUM 7.2* 7.2*   No results for input(s): PTH in the last 72 hours. Iron Studies:  Recent Labs  06/10/15 0420  IRON 123  TIBC 165*   CBG (last 3)   Recent Labs  06/10/15 1951 06/10/15 2358 06/11/15 0430  GLUCAP 159* 133* 138*     Studies/Results: Dg Abd 1 View  06/10/2015   CLINICAL DATA:  OG tube placement  EXAM: ABDOMEN - 1 VIEW  COMPARISON:   06/10/2015  FINDINGS: OG tube tip is in the mid stomach. Gallstones noted within the gallbladder. Nonobstructive bowel gas pattern.  IMPRESSION: OG tube tip in the mid stomach.   Electronically Signed   By: Rolm Baptise M.D.   On: 06/10/2015 10:43   US Renal Port  06/10/2015   CLINICAL DATA:  Acute kidney injury, history type II diabetes mellitus, hypertension, CHF, cardiomyopathy  EXAM: RENAL / URINARY TRACT ULTRASOUND COMPLETE  COMPARISON:  02/15/2015  FINDINGS: Right Kidney:  Length: 10.8 cm. Normal cortical thickness. Borderline increased renal cortical echogenicity. No mass, hydronephrosis or shadowing calcification.  Left Kidney:  Length: 10.7 cm. Cortical thinning. Increased cortical echogenicity. No mass, hydronephrosis or shadowing calcification.  Bladder:  Decompressed by Foley catheter, unable to evaluate.  Incidentally noted LEFT pleural effusion and cholelithiasis.  IMPRESSION: Medical renal disease changes.  No evidence of renal mass or hydronephrosis.  LEFT pleural effusion.  Cholelithiasis.   Electronically Signed   By: Lavonia Dana M.D.   On: 06/10/2015 21:43   Dg Chest Port 1 View  06/11/2015   CLINICAL DATA:  Intubation.  EXAM: PORTABLE CHEST - 1 VIEW  COMPARISON:  06/10/2015.  FINDINGS: Endotracheal tube, NG tube, right IJ line in stable position. Cardiomegaly with bilateral interstitial prominence and small right pleural effusion consistent with congestive heart failure. There has been interim improvement of pulmonary interstitial edema from prior  exam. No pneumothorax.  IMPRESSION: 1. Lines and tubes in stable position 2. Interim partial clearing of congestive heart failure and pulmonary interstitial edema. Small right pleural effusion.   Electronically Signed   By: Marcello Moores  Register   On: 06/11/2015 07:15   Dg Chest Port 1 View  06/10/2015   CLINICAL DATA:  Endotracheal tube placement.  Initial encounter.  EXAM: PORTABLE CHEST - 1 VIEW  COMPARISON:  Chest radiograph performed 06/09/2015   FINDINGS: The patient's endotracheal tube is seen ending 5 cm above the carina. An enteric tube is noted extending below the diaphragm. A right IJ line is noted ending about the mid SVC.  Vascular congestion is noted. Left basilar airspace opacity is noted. Increased interstitial markings raise concern for mild pulmonary edema. A small left pleural effusion is suspected. No pneumothorax is identified.  The cardiomediastinal silhouette is normal in size. No acute osseous abnormalities are identified.  IMPRESSION: 1. Endotracheal tube seen ending 5 cm above the carina. 2. Vascular congestion noted. Increased interstitial markings raise concern for mild pulmonary edema. Small left pleural effusion suspected.   Electronically Signed   By: Garald Balding M.D.   On: 06/10/2015 04:52   Dg Chest Port 1 View  06/09/2015   CLINICAL DATA:  81 year old female status post central line placement  EXAM: PORTABLE CHEST - 1 VIEW  COMPARISON:  Prior chest x-ray 06/09/2015  FINDINGS: New right IJ approach central venous catheter. Catheter tip projects over the distal SVC. No evidence of pneumothorax or hemothorax. Similar appearance of cardiac and mediastinal contours. There is prominence of the right mediastinum concerning for underlying mass or adenopathy. Slightly decreased interstitial edema. Persistent bibasilar atelectasis.  IMPRESSION: 1. The tip of the new right IJ approach central venous catheter projects over the distal SVC. No evidence of pneumothorax or other complication. 2. Improving interstitial edema. 3. Persistent bibasilar atelectasis. 4. Prominence of the right hilum concerning for underlying adenopathy or mass. Consider further evaluation with non emergent CT scan of the chest with contrast.   Electronically Signed   By: Jacqulynn Cadet M.D.   On: 06/09/2015 13:47   Dg Chest Port 1 View  06/09/2015   CLINICAL DATA:  CHF.  Low blood pressure.  EXAM: PORTABLE CHEST - 1 VIEW  COMPARISON:  04/02/2015   FINDINGS: Lungs are hyperinflated. The heart is enlarged. There are perihilar changes of pulmonary edema. More confluent opacity at the left base may represent edema or infectious process. Suspect left pleural effusion.  IMPRESSION: 1. Changes consistent with pulmonary edema. 2. Left lower lobe infiltrate or confluent edema.   Electronically Signed   By: Nolon Nations M.D.   On: 06/09/2015 10:37   Dg Abd Portable 1v  06/10/2015   CLINICAL DATA:  Orogastric tube placement.  Initial encounter.  EXAM: PORTABLE ABDOMEN - 1 VIEW  COMPARISON:  None.  FINDINGS: The patient's enteric tube is noted ending overlying the gastroesophageal junction, with the side port at the distal esophagus. This could be advanced another 14 cm.  The stomach contains air. The visualized bowel gas pattern is grossly unremarkable. Gallstones are seen overlying the right upper quadrant. No acute osseous abnormalities are identified.  IMPRESSION: Enteric tube noted ending overlying the gastroesophageal junction, with the side port at the distal esophagus. This could be advanced another 14 cm.   Electronically Signed   By: Garald Balding M.D.   On: 06/10/2015 04:55    Scheduled: . antiseptic oral rinse  7 mL Mouth Rinse QID  .  aspirin  81 mg Oral Daily  . atorvastatin  10 mg Oral QPM  . chlorhexidine  15 mL Mouth Rinse BID  . clopidogrel  75 mg Oral Daily  . feeding supplement (PRO-STAT SUGAR FREE 64)  30 mL Per Tube Daily  . heparin  5,000 Units Subcutaneous 3 times per day  . insulin aspart  0-20 Units Subcutaneous 6 times per day  . pantoprazole (PROTONIX) IV  40 mg Intravenous Q24H  . piperacillin-tazobactam (ZOSYN)  IV  2.25 g Intravenous 4 times per day  . saccharomyces boulardii  250 mg Oral QHS  . sodium chloride  10-40 mL Intracatheter Q12H  . sodium chloride  3 mL Intravenous Q12H  . vancomycin  750 mg Intravenous Q24H    Assessment/Plan: Assessment/Plan: 1. Acute on chronic kidney disease-non oliguric.  Likely  2/2 cardiogenic shock and sepsis. SPEP and UPEP pending.  - continue CRRT, keep even as she has required increased pressor requirement, BMET this afternoon much improved from this am. K ok but acid/base not optimal.  Only on for 6 h - Urine vol improving - FENa 12.5% indicating post renal/obstructive cause, may not be accurate in acute setting - renal ultrasound neg for hydronephrosis, cortical thinning on left kidney 2. Cardiogenic shock- per HF team. ECHO on 7/17 reveals EF of 20% w/ severe MR. Last ECHO this year on 01/2015 EF was 30-35%. On vasopressin and norepinephrine gtt. 3. Acute CHF exacerbation- improvement of pulmonary interstitial edema w/ small rt pleural effusion per CXR this am 4. New onset atrial fibrillation- likely 2/2 electrolyte shift and catechol administration. Overnight given bolus of amiodarone. Started on hep gtt and amio gtt per cardiology.  5. Anemia of chronic kidney disease- Fe normal, TIBC low (165) 6. Hyponatremia- Corrected Na for hyperglycemia is 130. likely 2/2 volume overload and CHF.  7. Lower tooth abscess- on vanc and zosyn 8. UA with many yeast- received dose of diflucan 9 DM 10. Elevated transaminases- likely due to shock liver from cardiogenic shock and sepsis   LOS: 3 days   Katie Fuentes 06/11/2015,7:49 AM I have seen and examined this patient and agree with the plan of care seen, eval, examined, discussed with patient, staff, husband , residents.  JD .  Edlyn Rosenburg L 06/11/2015, 5:00 PM

## 2015-06-11 NOTE — Progress Notes (Signed)
ANTICOAGULATION CONSULT NOTE - Initial Consult  Pharmacy Consult for Heparin Indication: atrial fibrillation  Allergies  Allergen Reactions  . Cefdinir Diarrhea  . Macrobid [Nitrofurantoin Macrocrystal] Nausea And Vomiting and Other (See Comments)    Fever, Chills  . Sulfa Antibiotics Nausea And Vomiting and Other (See Comments)    Fever,chills  . Alphagan [Brimonidine] Other (See Comments)    Redness in the eyes  . Azopt [Brinzolamide]     Redness in the eyes    Patient Measurements: Height: 5\' 2"  (157.5 cm) Weight: 162 lb 4.1 oz (73.6 kg) IBW/kg (Calculated) : 50.1 Heparin Dosing Weight: 65kg  Vital Signs: Temp: 97.6 F (36.4 C) (07/19 0400) Temp Source: Oral (07/19 0400) BP: 87/51 mmHg (07/19 0741) Pulse Rate: 143 (07/19 0741)  Labs:  Recent Labs  06/02/2015 2041 06/09/15 0049 06/09/15 0709  06/10/15 0420 06/10/15 1600 06/11/15 0430 06/11/15 0500 06/11/15 0850  HGB  --   --  8.1*  --  8.9*  --  9.5*  --  9.6*  HCT  --   --  24.2*  --  26.1*  --  27.0*  --  27.2*  PLT  --   --  240  --  269  --  242  --  238  APTT  --   --   --   --   --   --  56*  --   --   LABPROT  --   --   --   --   --   --  22.3*  --   --   INR  --   --   --   --   --   --  1.97*  --   --   CREATININE  --   --  3.30*  < > 4.02* 4.53* 2.66* 2.63*  --   TROPONINI 0.72* 0.95* 1.93*  --   --   --   --   --   --   < > = values in this interval not displayed.  Estimated Creatinine Clearance: 16.3 mL/min (by C-G formula based on Cr of 2.63).   Medical History: Past Medical History  Diagnosis Date  . Atrophic vaginitis   . Yeast infection   . Glaucoma   . Cataracts, bilateral   . Type 2 diabetes, controlled, with renal manifestation 01/2000  . Hyperlipidemia   . Hypertension   . Congestive heart failure   . Cardiomyopathy   . LBBB (left bundle branch block)   . Chronic renal disease, stage III   . Arthritis    Assessment: 81yof followed by the heart failure service, known to  pharmacy from antibiotic dosing, with new onset afib RVR (CHADSVASC = 5). She will begin IV heparin. Of note, she has shock liver with INR of 1.97 in the absence of anticoagulation. She also was receiving sq heparin for prophylaxis with last dose today at 0614 so will avoid bolus. CBC is stable.  Goal of Therapy:  Heparin level 0.3-0.7 units/ml Monitor platelets by anticoagulation protocol: Yes   Plan:  1) Stop sq heparin 2) Begin IV heparin at 900 units/hr 3) Check 8 hour heparin level 4) Daily heparin level and CBC  Deboraha Sprang 06/11/2015,9:55 AM

## 2015-06-11 NOTE — Progress Notes (Signed)
[place] for Heparin Indication: atrial fibrillation  Allergies  Allergen Reactions  . Cefdinir Diarrhea  . Macrobid [Nitrofurantoin Macrocrystal] Nausea And Vomiting and Other (See Comments)    Fever, Chills  . Sulfa Antibiotics Nausea And Vomiting and Other (See Comments)    Fever,chills  . Alphagan [Brimonidine] Other (See Comments)    Redness in the eyes  . Azopt [Brinzolamide]     Redness in the eyes    Patient Measurements: Height: 5\' 2"  (157.5 cm) Weight: 162 lb 4.1 oz (73.6 kg) IBW/kg (Calculated) : 50.1 Heparin Dosing Weight: 65kg  Vital Signs: Temp: 96.4 F (35.8 C) (07/19 1600) Temp Source: Axillary (07/19 1600) BP: 84/53 mmHg (07/19 1530) Pulse Rate: 25 (07/19 1800)  Labs:  Recent Labs  06/09/15 0049  06/09/15 0709  06/10/15 0420  06/11/15 0430 06/11/15 0500 06/11/15 0850 06/11/15 1230 06/11/15 1613 06/11/15 1820 06/11/15 2020  HGB  --   < > 8.1*  --  8.9*  --  9.5*  --  9.6*  --   --   --   --   HCT  --   < > 24.2*  --  26.1*  --  27.0*  --  27.2*  --   --   --   --   PLT  --   < > 240  --  269  --  242  --  238  --   --   --   --   APTT  --   --   --   --   --   --  56*  --   --   --   --   --   --   LABPROT  --   --   --   --   --   --  22.3*  --   --   --   --   --   --   INR  --   --   --   --   --   --  1.97*  --   --   --   --   --   --   HEPARINUNFRC  --   --   --   --   --   --   --   --   --   --   --  >2.20* >2.20*  CREATININE  --   --  3.30*  < > 4.02*  < > 2.66* 2.63*  --  0.78 1.74*  --   --   TROPONINI 0.95*  --  1.93*  --   --   --   --   --   --   --   --   --   --   < > = values in this interval not displayed.  Estimated Creatinine Clearance: 24.6 mL/min (by C-G formula based on Cr of 1.74).  Assessment: 81yof followed by the heart failure service, known to pharmacy from antibiotic dosing, with new onset afib RVR (CHADSVASC = 5) started on IV heparin this afternoon. Of note, she has shock  liver with INR of 1.97 in the absence of anticoagulation. CBC is stable.  First heparin level was elevated >2.2, and upon discussion with night RN [place], there was concern it may have been drawn incorrectly by day RN. A stat level was reordered and this also was elevated >2.2. No bleeding or oozing.  Goal of Therapy:  Heparin level 0.3-0.7 units/ml Monitor platelets  by anticoagulation protocol: Yes   Plan:  -hold heparin x1 hour -restart at 2300 tonight at rate of 700 units/hr (decrease of 2units/kg/hr) -HL at 0700  Melenie Minniear D. Jadis Mika, PharmD, BCPS Clinical Pharmacist Pager: [phone] 06/11/2015 9:45 PM

## 2015-06-12 ENCOUNTER — Inpatient Hospital Stay (HOSPITAL_COMMUNITY): Payer: Medicare Other

## 2015-06-12 ENCOUNTER — Encounter (HOSPITAL_COMMUNITY): Payer: Medicare Other

## 2015-06-12 LAB — PROTEIN ELECTROPHORESIS, SERUM
A/G Ratio: 0.6 — ABNORMAL LOW (ref 0.7–1.7)
Albumin ELP: 2.8 g/dL — ABNORMAL LOW (ref 2.9–4.4)
Alpha-1-Globulin: 0.6 g/dL — ABNORMAL HIGH (ref 0.0–0.4)
Alpha-2-Globulin: 1.5 g/dL — ABNORMAL HIGH (ref 0.4–1.0)
Beta Globulin: 1.8 g/dL — ABNORMAL HIGH (ref 0.7–1.3)
GLOBULIN, TOTAL: 4.6 g/dL — AB (ref 2.2–3.9)
Gamma Globulin: 0.7 g/dL (ref 0.4–1.8)
M-SPIKE, %: 0.9 g/dL — AB
Total Protein ELP: 7.4 g/dL (ref 6.0–8.5)

## 2015-06-12 LAB — CBC
HCT: 26 % — ABNORMAL LOW (ref 36.0–46.0)
HEMOGLOBIN: 9 g/dL — AB (ref 12.0–15.0)
MCH: 29.4 pg (ref 26.0–34.0)
MCHC: 34.6 g/dL (ref 30.0–36.0)
MCV: 85 fL (ref 78.0–100.0)
PLATELETS: 209 10*3/uL (ref 150–400)
RBC: 3.06 MIL/uL — AB (ref 3.87–5.11)
RDW: 16.3 % — ABNORMAL HIGH (ref 11.5–15.5)
WBC: 26.1 10*3/uL — AB (ref 4.0–10.5)

## 2015-06-12 LAB — COMPREHENSIVE METABOLIC PANEL
ALBUMIN: 2.1 g/dL — AB (ref 3.5–5.0)
ALK PHOS: 122 U/L (ref 38–126)
ALT: 3970 U/L — ABNORMAL HIGH (ref 14–54)
ANION GAP: 19 — AB (ref 5–15)
AST: 4085 U/L — AB (ref 15–41)
BILIRUBIN TOTAL: 1.8 mg/dL — AB (ref 0.3–1.2)
BUN: 21 mg/dL — ABNORMAL HIGH (ref 6–20)
CO2: 13 mmol/L — AB (ref 22–32)
Calcium: 7 mg/dL — ABNORMAL LOW (ref 8.9–10.3)
Chloride: 100 mmol/L — ABNORMAL LOW (ref 101–111)
Creatinine, Ser: 1.24 mg/dL — ABNORMAL HIGH (ref 0.44–1.00)
GFR calc Af Amer: 47 mL/min — ABNORMAL LOW (ref >60)
GFR calc non Af Amer: 40 mL/min — ABNORMAL LOW (ref >60)
Glucose, Bld: 133 mg/dL — ABNORMAL HIGH (ref 65–99)
Potassium: 4.6 mmol/L (ref 3.5–5.1)
SODIUM: 132 mmol/L — AB (ref 135–145)
TOTAL PROTEIN: 5.5 g/dL — AB (ref 6.5–8.1)

## 2015-06-12 LAB — CARBOXYHEMOGLOBIN
CARBOXYHEMOGLOBIN: 0.8 % (ref 0.5–1.5)
Methemoglobin: 1 % (ref 0.0–1.5)
O2 SAT: 52.7 %
Total hemoglobin: 9.3 g/dL — ABNORMAL LOW (ref 12.0–16.0)

## 2015-06-12 LAB — POCT I-STAT 3, ART BLOOD GAS (G3+)
Acid-base deficit: 10 mmol/L — ABNORMAL HIGH (ref 0.0–2.0)
BICARBONATE: 14.3 meq/L — AB (ref 20.0–24.0)
O2 Saturation: 99 %
PH ART: 7.365 (ref 7.350–7.450)
TCO2: 15 mmol/L (ref 0–100)
pCO2 arterial: 25.1 mmHg — ABNORMAL LOW (ref 35.0–45.0)
pO2, Arterial: 143 mmHg — ABNORMAL HIGH (ref 80.0–100.0)

## 2015-06-12 LAB — GLUCOSE, CAPILLARY
GLUCOSE-CAPILLARY: 130 mg/dL — AB (ref 65–99)
GLUCOSE-CAPILLARY: 104 mg/dL — AB (ref 65–99)
GLUCOSE-CAPILLARY: 100 mg/dL — AB (ref 65–99)
GLUCOSE-CAPILLARY: 60 mg/dL — AB (ref 65–99)
Glucose-Capillary: 127 mg/dL — ABNORMAL HIGH (ref 65–99)
Glucose-Capillary: 174 mg/dL — ABNORMAL HIGH (ref 65–99)
Glucose-Capillary: 49 mg/dL — ABNORMAL LOW (ref 65–99)

## 2015-06-12 LAB — POCT ACTIVATED CLOTTING TIME
ACTIVATED CLOTTING TIME: 227 s
ACTIVATED CLOTTING TIME: 214 s
ACTIVATED CLOTTING TIME: 214 s
Activated Clotting Time: 220 seconds
Activated Clotting Time: 226 seconds
Activated Clotting Time: 227 seconds
Activated Clotting Time: 183 seconds
Activated Clotting Time: 189 seconds
Activated Clotting Time: 183 seconds
Activated Clotting Time: 189 seconds
Activated Clotting Time: 183 seconds
Activated Clotting Time: 189 seconds

## 2015-06-12 LAB — HEPARIN LEVEL (UNFRACTIONATED): HEPARIN UNFRACTIONATED: 1.5 [IU]/mL — AB (ref 0.30–0.70)

## 2015-06-12 LAB — MAGNESIUM: Magnesium: 2.4 mg/dL (ref 1.7–2.4)

## 2015-06-12 LAB — PHOSPHORUS: Phosphorus: 3.2 mg/dL (ref 2.5–4.6)

## 2015-06-12 LAB — LACTIC ACID, PLASMA: Lactic Acid, Venous: 10.1 mmol/L (ref 0.5–2.0)

## 2015-06-12 MED ORDER — HEPARIN SODIUM (PORCINE) 1000 UNIT/ML DIALYSIS
1000.0000 [IU] | INTRAMUSCULAR | Status: DC | PRN
  Filled 2015-06-12: qty 6

## 2015-06-12 MED ORDER — HEPARIN SODIUM (PORCINE) 5000 UNIT/ML IJ SOLN
250.0000 [IU]/h | INTRAMUSCULAR | Status: DC
  Filled 2015-06-12: qty 2

## 2015-06-12 MED ORDER — MORPHINE SULFATE 25 MG/ML IV SOLN
10.0000 mg/h | INTRAVENOUS | Status: DC
  Administered 2015-06-12: 1 mg/h via INTRAVENOUS
  Filled 2015-06-12: qty 10

## 2015-06-12 MED ORDER — PRISMASOL BGK 4/2.5 32-4-2.5 MEQ/L IV SOLN
INTRAVENOUS | Status: DC
  Administered 2015-06-12 (×3): via INTRAVENOUS_CENTRAL
  Filled 2015-06-12 (×13): qty 5000

## 2015-06-12 MED ORDER — STERILE WATER FOR INJECTION IV SOLN
INTRAVENOUS | Status: DC
  Administered 2015-06-12 (×2): via INTRAVENOUS_CENTRAL
  Filled 2015-06-12 (×9): qty 150

## 2015-06-12 MED ORDER — HEPARIN BOLUS VIA INFUSION (CRRT)
1000.0000 [IU] | INTRAVENOUS | Status: DC | PRN
  Filled 2015-06-12: qty 1000

## 2015-06-12 MED ORDER — HEPARIN (PORCINE) IN NACL 100-0.45 UNIT/ML-% IJ SOLN
550.0000 [IU]/h | INTRAMUSCULAR | Status: DC
  Filled 2015-06-12: qty 250

## 2015-06-12 MED ORDER — PRISMASOL BGK 4/2.5 32-4-2.5 MEQ/L IV SOLN
INTRAVENOUS | Status: DC
  Administered 2015-06-12 (×2): via INTRAVENOUS_CENTRAL
  Filled 2015-06-12 (×7): qty 5000

## 2015-06-12 MED ORDER — DEXTROSE 5 % IV SOLN
0.0000 ug/min | INTRAVENOUS | Status: DC
  Administered 2015-06-12: 85 ug/min via INTRAVENOUS
  Administered 2015-06-12 (×3): 100 ug/min via INTRAVENOUS
  Filled 2015-06-12 (×7): qty 16

## 2015-06-12 MED ORDER — MORPHINE BOLUS VIA INFUSION
5.0000 mg | INTRAVENOUS | Status: DC | PRN
  Filled 2015-06-12: qty 20

## 2015-06-12 MED ORDER — DEXTROSE 50 % IV SOLN
INTRAVENOUS | Status: AC
  Administered 2015-06-12: 25 mL via NASAL
  Filled 2015-06-12: qty 50

## 2015-06-12 MED ORDER — DEXTROSE 50 % IV SOLN
25.0000 mL | Freq: Once | INTRAVENOUS | Status: AC
  Filled 2015-06-12: qty 50

## 2015-06-12 MED ORDER — HEPARIN (PORCINE) 2000 UNITS/L FOR CRRT
INTRAVENOUS_CENTRAL | Status: DC | PRN
  Filled 2015-06-12: qty 1000

## 2015-06-12 NOTE — Progress Notes (Signed)
ANTICOAGULATION CONSULT NOTE - Follow Up Consult  Pharmacy Consult for Heparin Indication: atrial fibrillation  Allergies  Allergen Reactions  . Cefdinir Diarrhea  . Macrobid [Nitrofurantoin Macrocrystal] Nausea And Vomiting and Other (See Comments)    Fever, Chills  . Sulfa Antibiotics Nausea And Vomiting and Other (See Comments)    Fever,chills  . Alphagan [Brimonidine] Other (See Comments)    Redness in the eyes  . Azopt [Brinzolamide]     Redness in the eyes    Patient Measurements: Height: 5\' 2"  (157.5 cm) Weight: 164 lb 10.9 oz (74.7 kg) IBW/kg (Calculated) : 50.1 Heparin Dosing Weight: 65kg  Vital Signs: Temp: 95.4 F (35.2 C) (07/20 0400) Temp Source: Axillary (07/20 0400) BP: 126/92 mmHg (07/20 0935) Pulse Rate: 107 (07/20 0900)  Labs:  Recent Labs  06/11/15 0430  06/11/15 0850 06/11/15 1230 06/11/15 1613 06/11/15 1820 06/11/15 2020 06/06/2015 0330 06/07/2015 0745  HGB 9.5*  --  9.6*  --   --   --   --  9.0*  --   HCT 27.0*  --  27.2*  --   --   --   --  26.0*  --   PLT 242  --  238  --   --   --   --  209  --   APTT 56*  --   --   --   --   --   --   --   --   LABPROT 22.3*  --   --   --   --   --   --   --   --   INR 1.97*  --   --   --   --   --   --   --   --   HEPARINUNFRC  --   --   --   --   --  >2.20* >2.20*  --  1.50*  CREATININE 2.66*  < >  --  0.78 1.74*  --   --  1.24*  --   < > = values in this interval not displayed.  Estimated Creatinine Clearance: 34.8 mL/min (by C-G formula based on Cr of 1.24).   Medications:  Heparin @ 700 units/hr  Assessment: 81yof started on IV heparin yesterday for new onset afib (CHADSVASC =5). Initial heparin level was > 2.2. Infusion held x 1 hour then resumed at lower rate. Follow up level is lower, but still remains above goal at 1.5. CBC remains stable. No bleeding reported.  Goal of Therapy:  Heparin level 0.3-0.7 units/ml Monitor platelets by anticoagulation protocol: Yes   Plan:  1) Hold heparin x  1 hour 2) Resume at 550 units/hr 3) Check 8 hour heparin level  Deboraha Sprang 06/16/2015,9:45 AM

## 2015-06-12 NOTE — Progress Notes (Signed)
PULMONARY  / CRITICAL CARE MEDICINE CONSULTATION   Name: Katie Fuentes MRN: [id number] DOB: 28-Jun-1934    ADMISSION DATE:  05/30/2015 CONSULTATION DATE: June 12, 2015  REQUESTING CLINICIAN: Jolaine Artist, MD PRIMARY SERVICE: Cardiology  CHIEF COMPLAINT:  "I don't feel well"  BRIEF PATIENT DESCRIPTION: 81 y/o woman with significant CV disease (including CAD as well as nonischemic cardiomyopathy, and RAS s/p stent placement) who developed sepsis due to infected tooth.  SIGNIFICANT EVENTS / STUDIES:  7/17 echo >> worsening LV function 7/18 - new afib started, amiodarone bolus given with no improvement 7/18 - CRT started  LINES / TUBES: CVC in R IJ 7/18>>> ETT 7/18>>> R radial a-line 7/18>>> PIVs Foley 7/16>>> CVVHD 7/18 >>  CULTURES: Blood 7/18>>>NTD  ANTIBIOTICS: Pip-tazo 06/09/15>>> Vanc 06/09/15>>> Fluconazole x1 7/17  HISTORY OF PRESENT ILLNESS:  Katie Fuentes is a 81 y/o woman with a history of significant CV disease (CAD, RAS s/p stent, chronic nonischemic cardiomyopathy) who presented to the ED with complaints of nausea, vomiting, and malaise in the setting of a toothache and a dental exam suggestive of a dental abscess. She had presented to her dentist who recommended extraction, but held off due to her plavix. She then began to feel generally ill with nausea and vomiting and presented to the ED. She was found to be hypotensive with acute decline in renal function. She was admitted to cardiology who began to workup her shock. Given her high WBC and clinical history of a toothache, sepsis was considered and PCCM called to better evaluate. Initially, her Co-Ox was higher than would be imaged for someone with severe heart disease (near-60%), but then declined to low 50s. Given her complexity and concern for septic component, PCCM was called to assist on consultation.  SUBJECTIVE: Received amiodarone bolus overnight for new afib with RVR, CVVHD started last night.  Husband at bedside says they had a good night, she slept well  VITAL SIGNS: Temp:  [95.4 F (35.2 C)-97.8 F (36.6 C)] 97.8 F (36.6 C) (07/20 0800) Pulse Rate:  [25-119] 110 (07/20 1100) Resp:  [19-31] 24 (07/20 1100) BP: (78-126)/(53-92) 94/60 mmHg (07/20 1100) SpO2:  [87 %-100 %] 100 % (07/20 1100) Arterial Line BP: (53-101)/(35-61) 93/57 mmHg (07/20 0915) FiO2 (%):  [40 %-50 %] 40 % (07/20 0859) Weight:  [74.7 kg (164 lb 10.9 oz)] 74.7 kg (164 lb 10.9 oz) (07/20 0500)  HEMODYNAMICS: CVP:  [12 mmHg-14 mmHg] 12 mmHg  VENTILATOR SETTINGS: Vent Mode:  [-] PRVC FiO2 (%):  [40 %-50 %] 40 % Set Rate:  [21 bmp] 21 bmp Vt Set:  [450 mL] 450 mL PEEP:  [5 cmH20] 5 cmH20 Plateau Pressure:  [12 cmH20-19 cmH20] 19 cmH20  INTAKE / OUTPUT: Intake/Output      07/19 0701 - 07/20 0700 07/20 0701 - 07/21 0700   P.O. 30 0   I.V. (mL/kg) 2152.4 (28.8) 521.7 (7)   Other     NG/GT 1165.8 35   IV Piggyback 275    Total Intake(mL/kg) 3623.2 (48.5) 556.7 (7.5)   Urine (mL/kg/hr) 30 (0) 2 (0)   Emesis/NG output 300 (0.2)    Other 1618 (0.9) 7 (0)   Stool 1 (0)    Total Output 1949 9   Net +1674.2 +547.7        Stool Occurrence 1 x     PHYSICAL EXAMINATION: General:  Pale woman intubated but interactive, able to write notes to communicate Neuro:  Intact, moving all ext to command, not sedated.  HEENT:  [place]/AT, PERRL, EOM-I and MMM Neck: +JVD but no tenderness. Cardiovascular: Tachycardic, no appreciable r/m/g Lungs:  Coarse BS diffusely. Abdomen:  Soft, non-tender, non-distended and +BS. Musculoskeletal:  -edema and -tenderness. Skin:  Intact but thin.  LABS:  CBC  Recent Labs Lab 06/11/15 0430 06/11/15 0850 06/20/2015 0330  WBC 18.7* 22.6* 26.1*  HGB 9.5* 9.6* 9.0*  HCT 27.0* 27.2* 26.0*  PLT 242 238 209   Coag's  Recent Labs Lab 06/11/15 0430  APTT 56*  INR 1.97*   BMET  Recent Labs Lab 06/11/15 1230 06/11/15 1613 06/22/2015 0330  NA 136 133* 132*  K 4.2 4.7  4.6  CL 100* 99* 100*  CO2 23 16* 13*  BUN 16 34* 21*  CREATININE 0.78 1.74* 1.24*  GLUCOSE 132* 153* 133*   Electrolytes  Recent Labs Lab 06/10/15 2142 06/11/15 0430 06/11/15 0500 06/11/15 1230 06/11/15 1613 05/30/2015 0330  CALCIUM  --  7.2* 7.2* 7.9* 7.4* 7.0*  MG 2.1 2.1  --   --   --  2.4  PHOS  --  4.2 4.2  --  3.9 3.2   Sepsis Markers  Recent Labs Lab 06/10/15 0019 06/10/15 0320 06/04/2015 0150  LATICACIDVEN 1.9 1.5 10.1*   ABG  Recent Labs Lab 06/11/15 2238 06/11/15 2244 06/16/2015 0323  PHART 7.381 7.390 7.365  PCO2ART 24.4* 22.6* 25.1*  PO2ART 143.0* 138.0* 143.0*   Liver Enzymes  Recent Labs Lab 06/09/15 0709  06/10/15 2142 06/11/15 0500 06/11/15 1613 06/05/2015 0330  AST 54*  --  3251*  --   --  4085*  ALT 49  --  3029*  --   --  3970*  ALKPHOS 40  --  80  --   --  122  BILITOT 0.6  --  1.3*  --   --  1.8*  ALBUMIN 2.4*  < > 2.2* 2.1* 2.1* 2.1*  < > = values in this interval not displayed. Cardiac Enzymes  Recent Labs Lab 06/14/2015 2041 06/09/15 0049 06/09/15 0709  TROPONINI 0.72* 0.95* 1.93*   Glucose  Recent Labs Lab 06/11/15 0812 06/11/15 1206 06/11/15 1604 06/11/15 1943 05/29/2015 0043 05/25/2015 0319  GLUCAP 108* 122* 136* 174* 127* 130*   Imaging US Renal Port  06/10/2015   CLINICAL DATA:  Acute kidney injury, history type II diabetes mellitus, hypertension, CHF, cardiomyopathy  EXAM: RENAL / URINARY TRACT ULTRASOUND COMPLETE  COMPARISON:  02/15/2015  FINDINGS: Right Kidney:  Length: 10.8 cm. Normal cortical thickness. Borderline increased renal cortical echogenicity. No mass, hydronephrosis or shadowing calcification.  Left Kidney:  Length: 10.7 cm. Cortical thinning. Increased cortical echogenicity. No mass, hydronephrosis or shadowing calcification.  Bladder:  Decompressed by Foley catheter, unable to evaluate.  Incidentally noted LEFT pleural effusion and cholelithiasis.  IMPRESSION: Medical renal disease changes.  No evidence of  renal mass or hydronephrosis.  LEFT pleural effusion.  Cholelithiasis.   Electronically Signed   By: Lavonia Dana M.D.   On: 06/10/2015 21:43   Dg Chest Port 1 View  06/22/2015   CLINICAL DATA:  Intubation.  EXAM: PORTABLE CHEST - 1 VIEW  COMPARISON:  06/11/2015.  FINDINGS: Endotracheal tube, NG tube, right IJ line in stable position. Cardiomegaly. Persistent bilateral pulmonary alveolar infiltrates and pleural effusions consistent with persistent congestive heart failure. Other etiology bilateral pulmonary infiltrates including pneumonia cannot be excluded. No pneumothorax.  IMPRESSION: 1. Lines and tubes in stable position . 2. Persistent cardiomegaly with bilateral pulmonary alveolar infiltrates and pleural effusions consistent with congestive heart failure.  Other etiologies of bilateral pulmonary infiltrates including pneumonia cannot be excluded.   Electronically Signed   By: Marcello Moores  Register   On: 06/02/2015 07:11   Dg Chest Port 1 View  06/11/2015   CLINICAL DATA:  Intubation.  EXAM: PORTABLE CHEST - 1 VIEW  COMPARISON:  06/10/2015.  FINDINGS: Endotracheal tube, NG tube, right IJ line in stable position. Cardiomegaly with bilateral interstitial prominence and small right pleural effusion consistent with congestive heart failure. There has been interim improvement of pulmonary interstitial edema from prior exam. No pneumothorax.  IMPRESSION: 1. Lines and tubes in stable position 2. Interim partial clearing of congestive heart failure and pulmonary interstitial edema. Small right pleural effusion.   Electronically Signed   By: Marcello Moores  Register   On: 06/11/2015 07:15    EKG: Atrial fibrillation with RVR CXR: Mild volume overload as expressed by tracer interstitial edema. Small right pleural effusion  ASSESSMENT / PLAN:  PULMONARY A: Mild pulmonary edema Hypoxia Small R pleural effusion P:   - Full vent support for now.   - Extubate when brother arrives. - ABG and CXR in AM. - VAP  preventions. - Titrate O2 for sat of 88-92%.  CARDIOVASCULAR A: Acute on chronic systolic heart failure. New atrial fibrillation, likely due to electrolyte/fluid shifts and catechol administration P:   - Levophed for MAP of 65 mmHg (at 100 mcg/min currently). - Neosynephrine and attempt to decrease levo to address tachycardia. - Vasopressin (at 0.03 ml/hr currently). - Follow CVP. - Cards recommend starting heparin and amiodarone drip.   - Plan to minimize catechols as able due to new tachycardia. - CVVHD started 7/18.   RENAL A: AKI - Cr now plateaued but UOP is poor. P:   - Renal following. - Replace electrolytes as indicated. - Monitor UOP. - Continue hydration. - CVVH per renal.  GASTROINTESTINAL A: Low albumin P:   - Continue TF per nutrition.  HEMATOLOGIC A: Anemia, (hemoglobin 9.6, 7/19) P:   - Will follow up, transfuse for Hb <7.0. - CBC in AM.  INFECTIOUS A: Septic shock, likely due to oral source P:   - Once improved will need dental extractions. - Blood culture 7/18>>> - Vancomycin 7/18>>> 4/x - Zosyn 7/18>>> 4/x  ENDOCRINE A: DM2 P:   - Maintain glycemic control per CCM protocol.  NEUROLOGIC A: No active issues P:   - Intubated but not sedated - Fentanyl drip and PRN versed.  Spoke with husband and son extensively, after discussion, they expressed that patient never wanted this level of care and would want to simply die comfortable.  After discussion decision was made to make patient a full DNR and extubate once family arrives then will extubate.  The patient is critically ill with multiple organ systems failure and requires high complexity decision making for assessment and support, frequent evaluation and titration of therapies, application of advanced monitoring technologies and extensive interpretation of multiple databases.   Critical Care Time devoted to patient care services described in this note is  35  Minutes. This time reflects  time of care of this signee Dr Jennet Maduro. This critical care time does not reflect procedure time, or teaching time or supervisory time of PA/NP/Med student/Med Resident etc but could involve care discussion time.  Rush Farmer, M.D. [facility] Pulmonary/Critical Care Medicine. Pager: [phone]. After hours pager: [phone].  06/19/2015, 11:31 AM

## 2015-06-12 NOTE — Progress Notes (Signed)
250 ml of Morphine gtt wasted in sink. Witnessed by [employer].

## 2015-06-12 NOTE — Procedures (Signed)
Extubation Procedure Note  Patient Details:   Name: Katie Fuentes DOB: [birth year] MRN: [id number]   Airway Documentation:  Airway 7.5 mm (Active)  Secured at (cm) 23 cm 05/31/2015  7:50 PM  Measured From Lips 06/18/2015  7:50 PM  [place] 06/11/2015  7:50 PM  Secured By [employer] 06/07/2015  7:50 PM  Tube Holder Repositioned Yes 06/11/2015  7:50 PM  Cuff Pressure (cm H2O) 24 cm H2O 06/20/2015  8:59 AM  Site Condition Cool;Dry 06/14/2015  7:50 PM    Evaluation  O2 sats: stable throughout Complications: No apparent complications Patient did tolerate procedure well. Bilateral Breath Sounds: Clear, Diminished Suctioning: Oral No, pt not able to vocalize, pt was terminally extubated. RT extubated pt per MD order.   Virgilio Frees 05/31/2015, 8:39 PM

## 2015-06-12 NOTE — Progress Notes (Signed)
Critical lactic acid result reported to night fellow.

## 2015-06-12 NOTE — Progress Notes (Signed)
Per husband Sharen Heck, he would like to try and wait for one more family member. MD aware, all orders in place when pts family is ready. Pt awake, VSS, NAD. Will cont to monitor Closely and provide comfort for family/visitors

## 2015-06-12 NOTE — Progress Notes (Signed)
Spoke with the entire family, after discussion decision was made to make patient full DNR and withdraw when family is ready.  Will start morphine drip then d/c levophed, if continues to survive then remove  ETT.  Additional CC time of 60 min.  Rush Farmer, M.D. [facility] Pulmonary/Critical Care Medicine. Pager: [phone]. After hours pager: [phone].

## 2015-06-12 NOTE — Progress Notes (Signed)
Advanced Heart Failure Rounding Note   Subjective:    81 y/o woman well known to me from recent prolonged admission in April 2016 with severe HTN, decompensated systolic HF with EF 21-19%, moderate CAD and stage 4 CKD with baseline creatinine 1.6-2.0. During that admission had several episodes of flash pulmonary edema in setting of severe HTN. Cath with severely calcified coronaries and question of significant high-grade LM lesion however IVUS showed it to be 60%. Cath also showed high-grade right RAS which was stented. Hospitalization c/b AKI with peak creatinine 3.3.  She was discharged to SNF and was doing well until recently when she was found to have possible tooth abscess. Saw a dentist who put her on clindamycin but was awaiting cardiology clearance to proceed given plavix. Came to ER with nausea, vomiting, poor appetite, generalized weakness, dental pain. Denied F/C. Found to be hypotensive with SBP 80s.   IVF started and she developed CHF. Cr 1.9-> 2.9-> 3.3. WBC 12.5. BNP 3120 (baseline 320). Weigh 153 on admit (was 151 on 7/6). Cardiology consulted and started norepi and moved to ICU.   Remains on CRRT and vent. She continues on Vanc and Zosyn for possible sepsis. UA with many yeast and she was given 200 mg diflucan. Yesterday went into A fib RVR. Started on amio drip. Milrinone stopped and started on dobutamine 5 mcg. Norepi increased to 100 mcg.   Awake. Able to write-->Complaining of headache.   Cr 1.24  WBC 26.   Objective:   Weight Range:  Vital Signs:   Temp:  [95.4 F (35.2 C)-97.4 F (36.3 C)] 95.4 F (35.2 C) (07/20 0400) Pulse Rate:  [25-119] 110 (07/20 0800) Resp:  [19-29] 28 (07/20 0800) BP: (70-84)/(41-53) 84/53 mmHg (07/19 1530) SpO2:  [87 %-100 %] 100 % (07/20 0800) Arterial Line BP: (50-101)/(31-61) 101/61 mmHg (07/20 0800) FiO2 (%):  [40 %-50 %] 40 % (07/20 0327) Weight:  [164 lb 10.9 oz (74.7 kg)] 164 lb 10.9 oz (74.7 kg) (07/20 0500) Last BM Date:  06/11/15  Weight change: Filed Weights   06/10/15 0500 06/11/15 0500 05/24/2015 0500  Weight: 162 lb 4.1 oz (73.6 kg) 162 lb 4.1 oz (73.6 kg) 164 lb 10.9 oz (74.7 kg)    Intake/Output:   Intake/Output Summary (Last 24 hours) at 06/10/2015 0848 Last data filed at 06/23/2015 0800  Gross per 24 hour  Intake 3539.43 ml  Output   1833 ml  Net 1706.43 ml     Physical Exam: CVP 12 General:  Intubated. Awake. Pale HEENT: normal Neck: supple. JVP ~10. Carotids 2+ bilat; no bruits. No lymphadenopathy or thryomegaly appreciated. RIJ  Cor: PMI nondisplaced. Tachy irreuglar No rubs, gallops or murmurs. Lungs: clear Abdomen: soft, nontender, nondistended. No hepatosplenomegaly. No bruits or masses. Good bowel sounds. Extremities: no cyanosis, clubbing, rash, R and LLE 1+ edema. RLE Trialysis catheter Neuro: Intubated. Follows commands. Moves all 4 extremities w/o difficulty.   Telemetry: A fib 110-120s    Labs: Basic Metabolic Panel:  Recent Labs Lab 06/10/15 1600 06/10/15 2142 06/11/15 0430 06/11/15 0500 06/11/15 1230 06/11/15 1613 06/01/2015 0330  NA 125*  --  129* 130* 136 133* 132*  K 5.0  --  4.4 4.4 4.2 4.7 4.6  CL 96*  --  99* 99* 100* 99* 100*  CO2 12*  --  14* 14* 23 16* 13*  GLUCOSE 207*  --  139* 140* 132* 153* 133*  BUN 121*  --  67* 66* 16 34* 21*  CREATININE 4.53*  --  2.66* 2.63* 0.78 1.74* 1.24*  CALCIUM 7.4*  --  7.2* 7.2* 7.9* 7.4* 7.0*  MG  --  2.1 2.1  --   --   --  2.4  PHOS 6.3*  --  4.2 4.2  --  3.9 3.2    Liver Function Tests:  Recent Labs Lab 05/24/2015 1604 06/09/15 0709 06/10/15 1600 06/10/15 2142 06/11/15 0500 06/11/15 1613 05/29/2015 0330  AST 36 54*  --  3251*  --   --  4085*  ALT 35 49  --  3029*  --   --  3970*  ALKPHOS 35* 40  --  80  --   --  122  BILITOT 0.4 0.6  --  1.3*  --   --  1.8*  PROT 5.8* 5.7*  --  5.9*  --   --  5.5*  ALBUMIN 2.6* 2.4* 2.2* 2.2* 2.1* 2.1* 2.1*   No results for input(s): LIPASE, AMYLASE in the last 168  hours. No results for input(s): AMMONIA in the last 168 hours.  CBC:  Recent Labs Lab 06/09/15 0709 06/10/15 0420 06/11/15 0430 06/11/15 0850 06/02/2015 0330  WBC 12.5* 16.1* 18.7* 22.6* 26.1*  HGB 8.1* 8.9* 9.5* 9.6* 9.0*  HCT 24.2* 26.1* 27.0* 27.2* 26.0*  MCV 86.7 85.0 82.6 82.7 85.0  PLT 240 269 242 238 209    Cardiac Enzymes:  Recent Labs Lab 06/10/2015 1614 06/15/2015 2041 06/09/15 0049 06/09/15 0709  TROPONINI 0.53* 0.72* 0.95* 1.93*    BNP: BNP (last 3 results)  Recent Labs  04/03/15 1210 04/09/15 1115 06/09/15 1155  BNP 807.1* 323.0* 3120.5*    ProBNP (last 3 results)  Recent Labs  03/01/15 0840  PROBNP 742.0*      Other results:  Imaging: Dg Abd 1 View  06/10/2015   CLINICAL DATA:  OG tube placement  EXAM: ABDOMEN - 1 VIEW  COMPARISON:  06/10/2015  FINDINGS: OG tube tip is in the mid stomach. Gallstones noted within the gallbladder. Nonobstructive bowel gas pattern.  IMPRESSION: OG tube tip in the mid stomach.   Electronically Signed   By: Rolm Baptise M.D.   On: 06/10/2015 10:43   US Renal Port  06/10/2015   CLINICAL DATA:  Acute kidney injury, history type II diabetes mellitus, hypertension, CHF, cardiomyopathy  EXAM: RENAL / URINARY TRACT ULTRASOUND COMPLETE  COMPARISON:  02/15/2015  FINDINGS: Right Kidney:  Length: 10.8 cm. Normal cortical thickness. Borderline increased renal cortical echogenicity. No mass, hydronephrosis or shadowing calcification.  Left Kidney:  Length: 10.7 cm. Cortical thinning. Increased cortical echogenicity. No mass, hydronephrosis or shadowing calcification.  Bladder:  Decompressed by Foley catheter, unable to evaluate.  Incidentally noted LEFT pleural effusion and cholelithiasis.  IMPRESSION: Medical renal disease changes.  No evidence of renal mass or hydronephrosis.  LEFT pleural effusion.  Cholelithiasis.   Electronically Signed   By: Lavonia Dana M.D.   On: 06/10/2015 21:43   Dg Chest Port 1 View  06/21/2015   CLINICAL  DATA:  Intubation.  EXAM: PORTABLE CHEST - 1 VIEW  COMPARISON:  06/11/2015.  FINDINGS: Endotracheal tube, NG tube, right IJ line in stable position. Cardiomegaly. Persistent bilateral pulmonary alveolar infiltrates and pleural effusions consistent with persistent congestive heart failure. Other etiology bilateral pulmonary infiltrates including pneumonia cannot be excluded. No pneumothorax.  IMPRESSION: 1. Lines and tubes in stable position . 2. Persistent cardiomegaly with bilateral pulmonary alveolar infiltrates and pleural effusions consistent with congestive heart failure. Other etiologies of bilateral pulmonary infiltrates including pneumonia cannot be excluded.  Electronically Signed   By: Marcello Moores  Register   On: 06/04/2015 07:11   Dg Chest Port 1 View  06/11/2015   CLINICAL DATA:  Intubation.  EXAM: PORTABLE CHEST - 1 VIEW  COMPARISON:  06/10/2015.  FINDINGS: Endotracheal tube, NG tube, right IJ line in stable position. Cardiomegaly with bilateral interstitial prominence and small right pleural effusion consistent with congestive heart failure. There has been interim improvement of pulmonary interstitial edema from prior exam. No pneumothorax.  IMPRESSION: 1. Lines and tubes in stable position 2. Interim partial clearing of congestive heart failure and pulmonary interstitial edema. Small right pleural effusion.   Electronically Signed   By: Marcello Moores  Register   On: 06/11/2015 07:15     Medications:     Scheduled Medications: . antiseptic oral rinse  7 mL Mouth Rinse QID  . aspirin  81 mg Oral Daily  . chlorhexidine  15 mL Mouth Rinse BID  . clopidogrel  75 mg Oral Daily  . feeding supplement (PRO-STAT SUGAR FREE 64)  30 mL Per Tube Daily  . insulin aspart  0-20 Units Subcutaneous 6 times per day  . pantoprazole sodium  40 mg Per Tube Q24H  . piperacillin-tazobactam (ZOSYN)  IV  2.25 g Intravenous 4 times per day  . sodium chloride  10-40 mL Intracatheter Q12H  . sodium chloride  3 mL  Intravenous Q12H  . vancomycin  750 mg Intravenous Q24H    Infusions: . amiodarone 30 mg/hr (06/11/15 2252)  . DOBUTamine 5 mcg/kg/min (06/15/2015 0015)  . feeding supplement (VITAL AF 1.2 CAL) Stopped (05/28/2015 0500)  . fentaNYL infusion INTRAVENOUS    . heparin 10,000 units/ 20 mL infusion syringe 950 Units/hr (06/04/2015 0800)  . heparin 700 Units/hr (06/11/15 2300)  . norepinephrine (LEVOPHED) Adult infusion 100 mcg/min (06/18/2015 0814)  . phenylephrine (NEO-SYNEPHRINE) Adult infusion    . dialysis replacement fluid (prismasate) 800 mL/hr at 06/11/15 2311  . dialysis replacement fluid (prismasate)    . dialysate (PRISMASATE) 2,000 mL/hr at 06/19/2015 7654  . sodium bicarbonate (isotonic) 1000 mL infusion    . vasopressin (PITRESSIN) infusion - *FOR SHOCK* 0.03 Units/min (06/11/15 2018)    PRN Medications: Place/Maintain arterial line **AND** sodium chloride, acetaminophen **OR** acetaminophen, albuterol, fentaNYL (SUBLIMAZE) injection, heparin, heparin, heparin, midazolam, morphine injection, ondansetron **OR** ondansetron (ZOFRAN) IV, sodium chloride   Assessment:  1) Shock cardiogenic vs septic 2) Acute on chronic systolic HF 3) Acute respiratory failure 4) A/C renal failure stage 4 5) Elevated troponin - suspect HF +/- demand ischemia 6) Anemia of chronic disease 7) Acute Respiratory Failure--> Intubated 7/17 8) Uncontrolled DM 9) Elevated WBC--Blood cultures pending  10) UTI- UA many yeast-  11) A Fib RVR  12) Elevated AST/ALTdue to shock liver   Plan/Discussion:   Remains intubated.   CO-OX 52% on high dose pressors. No improvement after milrinone stopped and dobutamine started.  On Norepi 100 mcg,  Vaso 0.03 units, and Dobutamine 5 mcg. Remains hypotension--sepsis vs cardiogenic shock.   A fib RVR-  AST/ALT continue to trend up. Stop statin. Remains on amio drip at  30 mg per hour. [place] VASC Score 5. On heparin.    WBC trending up to 26. On Vanc and zosyn.   Day 3  CRRT.   No BB with hypotension. SPEP/UPEP ok.  Glucose better controlled.  Changed to resistant sliding scale.    Length of Stay: 4 CLEGG,AMY  NP-C   05/27/2015, 8:48 AM  Advanced Heart Failure Team Pager [phone] (M-F; 7a - 4p)  Please contact [facility] for night-coverage after hours (4p -7a ) and weekends on [email or web address]   Patient seen and examined with Darrick Grinder, NP. We discussed all aspects of the encounter. I agree with the assessment and plan as stated above.   She continues to deteriorate with multi-system organ failure despite extremely high-dose pressors.I spoke with her and her husband at length about options and they would like to have her extubated once her brother arrives this afternoon with plan for comfort care. She is now DNR.   Will extubate to Venturi mask and then sedate as needed and wean pressors. She understands that her lifespan is likely < 24 hours.   The patient is critically ill with multiple organ systems failure and requires high complexity decision making for assessment and support, frequent evaluation and titration of therapies, application of advanced monitoring technologies and extensive interpretation of multiple databases.   Critical Care Time devoted to patient care services described in this note is 45 Minutes.  Arnesha Schiraldi,MD 1:48 PM

## 2015-06-12 NOTE — Progress Notes (Signed)
   06/17/2015 1500  Clinical Encounter Type  Visited With Patient and family together  Visit Type Follow-up;Spiritual support;Social support  Referral From Nurse  Spiritual Encounters  Spiritual Needs Grief support  Stress Factors  Patient Stress Factors Loss   Chaplain was asked to visit the patient this morning. Chaplain spoke with patient's husband. Patient's husband had talked to a chaplain yesterday and upon the completion of the patient's advanced directive decided he wanted to complete one of his own. Patient's husband had all of the paperwork filled out and wanted chaplain to facilitate the completion of the document. Chaplain was able to help patient's husband complete this document. However, chaplain also provided grief support for the patient's husband during this time as he is knows the patient is dying. Patient's husband explained that he has been married to the patient for over 77 years and that he has never had to face losing someone so close to him. Chaplain encouraged patient's husband to make the most of the time he does have with the patient. Chaplain gave patient's husband a patient placement card and encouraged him not to worry about logistical details today but to rather spend time being present and with the patient through this experience. Patient is aware that chaplain is available for further grief support. Page Elodia Florence chaplain if grief support needed later today.  Leeann Bady, Claudius Sis, Chaplain  3:22 PM

## 2015-06-12 NOTE — Progress Notes (Signed)
CBG 49. The pt's RN reports that finger sticks have been inaccurate and performed a recheck.

## 2015-06-12 NOTE — Progress Notes (Addendum)
Awaiting family to arrive. Per Dr Nelda Marseille, will fulfill orders to terminally extubate pt with comfort cares when family voices they're ready. Pt is Awake and alert. Husband Lowell at bedside and appreciative, and aware of plan. CRRT is off at this time.

## 2015-06-12 NOTE — Progress Notes (Signed)
Subjective: Intubated, awake. Having neck pain, otherwise denies any other pain.   Objective: Vital signs in last 24 hours: Temp:  [95.4 F (35.2 C)-97.4 F (36.3 C)] 95.4 F (35.2 C) (07/20 0400) Pulse Rate:  [25-119] 110 (07/20 0800) Resp:  [19-29] 28 (07/20 0800) BP: (70-84)/(41-53) 84/53 mmHg (07/19 1530) SpO2:  [87 %-100 %] 100 % (07/20 0800) Arterial Line BP: (50-101)/(31-61) 101/61 mmHg (07/20 0800) FiO2 (%):  [40 %-50 %] 40 % (07/20 0327) Weight:  [164 lb 10.9 oz (74.7 kg)] 164 lb 10.9 oz (74.7 kg) (07/20 0500) Weight change: 2 lb 6.8 oz (1.1 kg)  Intake/Output from previous day: 07/19 0701 - 07/20 0700 In: 3623.2 [P.O.:30; I.V.:2152.4; NG/GT:1165.8; IV Piggyback:275] Out: 1948 [Urine:30; Emesis/NG output:300] Intake/Output this shift: Total I/O In: -  Out: 1 [Other:1]  Physical Exam  Constitutional: She appears well-developed and well-nourished.  HENT:  ETT in place   Eyes: Conjunctivae are normal.  Cardiovascular: Normal rate and regular rhythm.   Pulmonary/Chest: Effort normal and breath sounds normal.  Abdominal: Soft. Bowel sounds are normal. She exhibits no distension. There is no tenderness.  Musculoskeletal: She exhibits no edema.  Ted hose on, warm extremities   Lungs, rales in bases,  CV irreg gr 2/6 M  Lab Results:  Recent Labs  06/11/15 0850 06/19/2015 0330  WBC 22.6* 26.1*  HGB 9.6* 9.0*  HCT 27.2* 26.0*  PLT 238 209   BMET:   Recent Labs  06/11/15 1613 05/30/2015 0330  NA 133* 132*  K 4.7 4.6  CL 99* 100*  CO2 16* 13*  GLUCOSE 153* 133*  BUN 34* 21*  CREATININE 1.74* 1.24*  CALCIUM 7.4* 7.0*   No results for input(s): PTH in the last 72 hours. Iron Studies:   Recent Labs  06/10/15 0420  IRON 123  TIBC 165*   CBG (last 3)   Recent Labs  06/11/15 1943 05/27/2015 0043 05/24/2015 0319  GLUCAP 174* 127* 130*     Studies/Results: Dg Abd 1 View  06/10/2015   CLINICAL DATA:  OG tube placement  EXAM: ABDOMEN - 1 VIEW   COMPARISON:  06/10/2015  FINDINGS: OG tube tip is in the mid stomach. Gallstones noted within the gallbladder. Nonobstructive bowel gas pattern.  IMPRESSION: OG tube tip in the mid stomach.   Electronically Signed   By: Rolm Baptise M.D.   On: 06/10/2015 10:43   US Renal Port  06/10/2015   CLINICAL DATA:  Acute kidney injury, history type II diabetes mellitus, hypertension, CHF, cardiomyopathy  EXAM: RENAL / URINARY TRACT ULTRASOUND COMPLETE  COMPARISON:  02/15/2015  FINDINGS: Right Kidney:  Length: 10.8 cm. Normal cortical thickness. Borderline increased renal cortical echogenicity. No mass, hydronephrosis or shadowing calcification.  Left Kidney:  Length: 10.7 cm. Cortical thinning. Increased cortical echogenicity. No mass, hydronephrosis or shadowing calcification.  Bladder:  Decompressed by Foley catheter, unable to evaluate.  Incidentally noted LEFT pleural effusion and cholelithiasis.  IMPRESSION: Medical renal disease changes.  No evidence of renal mass or hydronephrosis.  LEFT pleural effusion.  Cholelithiasis.   Electronically Signed   By: Lavonia Dana M.D.   On: 06/10/2015 21:43   Dg Chest Port 1 View  06/16/2015   CLINICAL DATA:  Intubation.  EXAM: PORTABLE CHEST - 1 VIEW  COMPARISON:  06/11/2015.  FINDINGS: Endotracheal tube, NG tube, right IJ line in stable position. Cardiomegaly. Persistent bilateral pulmonary alveolar infiltrates and pleural effusions consistent with persistent congestive heart failure. Other etiology bilateral pulmonary infiltrates including pneumonia cannot be excluded.  No pneumothorax.  IMPRESSION: 1. Lines and tubes in stable position . 2. Persistent cardiomegaly with bilateral pulmonary alveolar infiltrates and pleural effusions consistent with congestive heart failure. Other etiologies of bilateral pulmonary infiltrates including pneumonia cannot be excluded.   Electronically Signed   By: Marcello Moores  Register   On: 06/01/2015 07:11   Dg Chest Port 1 View  06/11/2015    CLINICAL DATA:  Intubation.  EXAM: PORTABLE CHEST - 1 VIEW  COMPARISON:  06/10/2015.  FINDINGS: Endotracheal tube, NG tube, right IJ line in stable position. Cardiomegaly with bilateral interstitial prominence and small right pleural effusion consistent with congestive heart failure. There has been interim improvement of pulmonary interstitial edema from prior exam. No pneumothorax.  IMPRESSION: 1. Lines and tubes in stable position 2. Interim partial clearing of congestive heart failure and pulmonary interstitial edema. Small right pleural effusion.   Electronically Signed   By: Marcello Moores  Register   On: 06/11/2015 07:15    Scheduled: . antiseptic oral rinse  7 mL Mouth Rinse QID  . aspirin  81 mg Oral Daily  . chlorhexidine  15 mL Mouth Rinse BID  . clopidogrel  75 mg Oral Daily  . feeding supplement (PRO-STAT SUGAR FREE 64)  30 mL Per Tube Daily  . insulin aspart  0-20 Units Subcutaneous 6 times per day  . pantoprazole sodium  40 mg Per Tube Q24H  . piperacillin-tazobactam (ZOSYN)  IV  2.25 g Intravenous 4 times per day  . sodium chloride  10-40 mL Intracatheter Q12H  . sodium chloride  3 mL Intravenous Q12H  . vancomycin  750 mg Intravenous Q24H    Assessment/Plan: Assessment/Plan: 1. Acute on chronic kidney disease-non oliguric.  Likely 2/2 cardiogenic shock and sepsis.  - poor prognosis, Dr. Jimmy Footman spoke with husband at bedside regarding decreased likelihood of patient surviving this hospitalization. Husband, Sharen Heck expressed concerns of not wanting patient to suffer which he feels she has been, would like to wait for pt's brother and rest of family to come see patient this evening before considering moving towards comfort care. Per nurse, pt removed art line on her own.  - SPEP pending UPEP neg for M spike but with significant free light chains probably MGUS.  - continue CRRT, rising AG acidosis, adding NaHCO3 for acidemia.  - poor UOP, 30 ml/ 24 hours, anticipate removal of foley cath  tomorrow am.  - FENa 12.5% indicating post renal/obstructive cause, may not be accurate in acute setting - renal ultrasound neg for hydronephrosis, cortical thinning on left kidney Profound acid geneation rate.  Change to isotonic bicarb infusion prefilter.  Outlook grim 2. Cardiogenic shock- per HF team. ECHO on 7/17 reveals EF of 20% w/ severe MR. Last ECHO this year on 01/2015 EF was 30-35%. On multiple pressors w/ increasing requirement. Hypothermic overnight. Rising anion gap acidosis concerning for ischemia possible GI source.   3. Acute CHF exacerbation- persistent pulmonary congestion per CXR this am 4. New onset atrial fibrillation- likely 2/2 electrolyte shift and catechol administration. Overnight given bolus of amiodarone. Started on hep gtt and amio gtt per cardiology.  5. Anemia of chronic kidney disease- Fe normal, TIBC low (165) 6. Hyponatremia- likely 2/2 volume overload and CHF.  CRRT as above, unable to remove much fluid due to hypotension.  7. Lower tooth abscess- on vanc and zosyn.  8. UA with many yeast- received dose of diflucan 9 DM 10. Shock liver- transaminases continuing to rise, likely result of shock liver. Likely will resolve with  #2  LOS: 4 days   Julious Oka 06/21/2015,8:21 AM I have seen and examined this patient and agree with the plan of care seen, eval, discussed with staff and residents.  Husband counseled. .  Bryony Kaman L 05/24/2015, 1:45 PM

## 2015-06-12 NOTE — Progress Notes (Signed)
Time of Death called at 2045. Estill Bamberg RN and [facility] RN listened for one full minute. No heart sounds auscultated. Family at bedside including husband and 3 children. Dr. Domenic Polite also notified of pts death.

## 2015-06-13 DIAGNOSIS — Z66 Do not resuscitate: Secondary | ICD-10-CM

## 2015-06-13 DIAGNOSIS — Z515 Encounter for palliative care: Secondary | ICD-10-CM

## 2015-06-13 LAB — UIFE/LIGHT CHAINS/TP QN, 24-HR UR
% BETA, URINE: 22.7 %
ALPHA 1 URINE: 0.9 %
Albumin, U: 63.9 %
Alpha 2, Urine: 5.6 %
FREE LAMBDA LT CHAINS, UR: 58 mg/L — AB (ref 0.24–6.66)
Free Kappa/Lambda Ratio: 3.38 (ref 2.04–10.37)
Free Lt Chn Excr Rate: 196 mg/L — ABNORMAL HIGH (ref 1.35–24.19)
GAMMA GLOBULIN URINE: 6.8 %
Total Protein, Urine: 108.1 mg/dL

## 2015-06-14 ENCOUNTER — Encounter (HOSPITAL_COMMUNITY): Payer: Medicare Other

## 2015-06-14 LAB — CULTURE, BLOOD (ROUTINE X 2)
Culture: NO GROWTH
Culture: NO GROWTH

## 2015-06-17 ENCOUNTER — Encounter (HOSPITAL_COMMUNITY): Payer: Medicare Other

## 2015-06-24 DEATH — deceased

## 2015-06-25 ENCOUNTER — Ambulatory Visit: Payer: Medicare Other

## 2015-07-04 NOTE — Discharge Summary (Signed)
NAMELILIAS, LORENSEN          ACCOUNT NO.:  [id number]  MEDICAL RECORD NO.:  [id number]  LOCATION:  2H13C                        FACILITY:  [facility]  PHYSICIAN:  Providence Lanius, MD  DATE OF BIRTH:  [birth year]  DATE OF ADMISSION:  05/31/2015 DATE OF DISCHARGE:  June 23, 2015                              DISCHARGE SUMMARY   DEATH SUMMARY  PRIMARY DIAGNOSIS/CAUSE OF DEATH:  Acute on chronic congestive heart failure.  SECONDARY DIAGNOSES: 1. Acute pulmonary edema. 2. Acute respiratory failure with hypoxemia. 3. Atrial fibrillation with rapid ventricular response. 4. Acute kidney injury. 5. Hypoalbuminemia. 6. Septic shock. 7. Type 2 diabetes.  HOSPITAL COURSE:  The patient is a 81 year old female with extensive past medical history including congestive heart failure, presented to the hospital, not feeling well, found to be in acute on chronic decompensated congestive heart failure.  The patient was admitted to the hospital under the Hospitalist Service.  She continued to develop worsening shortness of breath, requiring intubation.  Pulmonary Critical Care was consulted.  The patient was intubated, transferred to the intensive care unit.  After multiple days of failure to show any improvement, the Critical Care Team met with the family.  Decision was made at this point to make the patient DNR since she had made her wishes clear she would not want that level of care, at which point, we began discussing with comfort measures.  The family wished to proceed with comfort measures and the patient was started on morphine drip and extubated, expired shortly thereafter.  Family was at the bedside.     Providence Lanius, MD     WJY/MEDQ  D:  07/03/2015  T:  07/04/2015  Job:  [id number]

## 2015-07-12 ENCOUNTER — Encounter (HOSPITAL_COMMUNITY): Payer: Medicare Other

## 2015-07-24 ENCOUNTER — Ambulatory Visit: Payer: Self-pay | Admitting: Nurse Practitioner

## 2015-08-05 ENCOUNTER — Other Ambulatory Visit (HOSPITAL_COMMUNITY): Payer: Medicare Other

## 2015-10-07 ENCOUNTER — Ambulatory Visit: Payer: Medicare Other | Admitting: Surgery

## 2015-10-07 ENCOUNTER — Encounter (HOSPITAL_COMMUNITY): Payer: Medicare Other

## 2015-12-31 IMAGING — CR DG CHEST 1V PORT
1 series · 1 of 1 positions shown · non-contrast
Comparison: 02/16/2015

CLINICAL DATA: c/o of shortness of breath that has been ongoing
since [REDACTED] and worsened tonight. With EMS, patient was
diaphoretic, faint and SOB. Room Air patient was 88%, placed on CPAP
O2 at 94. HX:HTN, dm

EXAM:
PORTABLE CHEST - 1 VIEW

[AP]
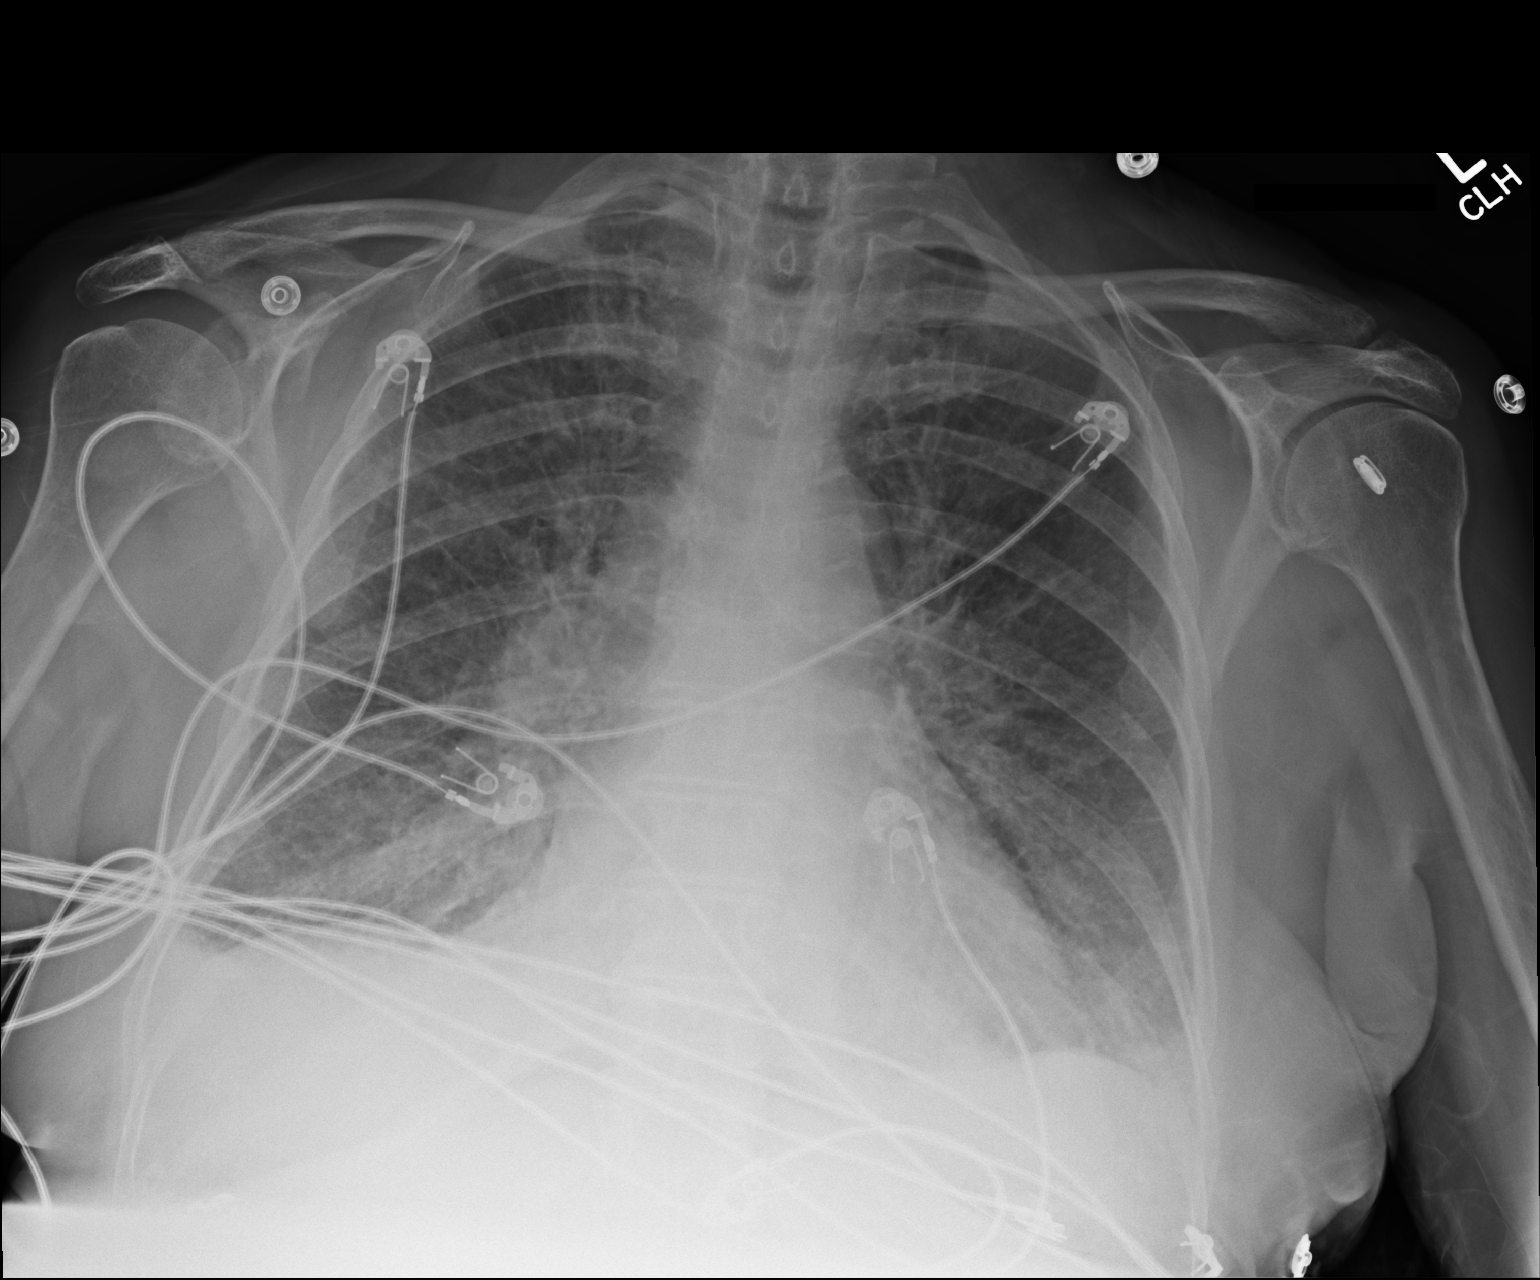

[1 of 1 positions shown; findings below may reference images not displayed]

FINDINGS: There is bilateral irregular interstitial thickening most prominent
in the lung bases. Small pleural effusions. No pneumothorax.

Cardiac silhouette is normal in size and configuration. No
mediastinal or hilar masses.

Bony thorax is intact.
IMPRESSION: Irregular interstitial thickening with small effusions is consistent
with interstitial edema. This may be on the basis of congestive
heart failure or noncardiogenic in origin. Appearance is similar to
a chest radiograph dated 02/14/2015. No convincing pneumonia.

## 2016-01-01 IMAGING — CR DG CHEST 1V PORT
1 series · 1 of 1 positions shown · non-contrast
Comparison: 03/16/2015.

CLINICAL DATA: Respiratory distress.

EXAM:
PORTABLE CHEST - 1 VIEW

[AP]
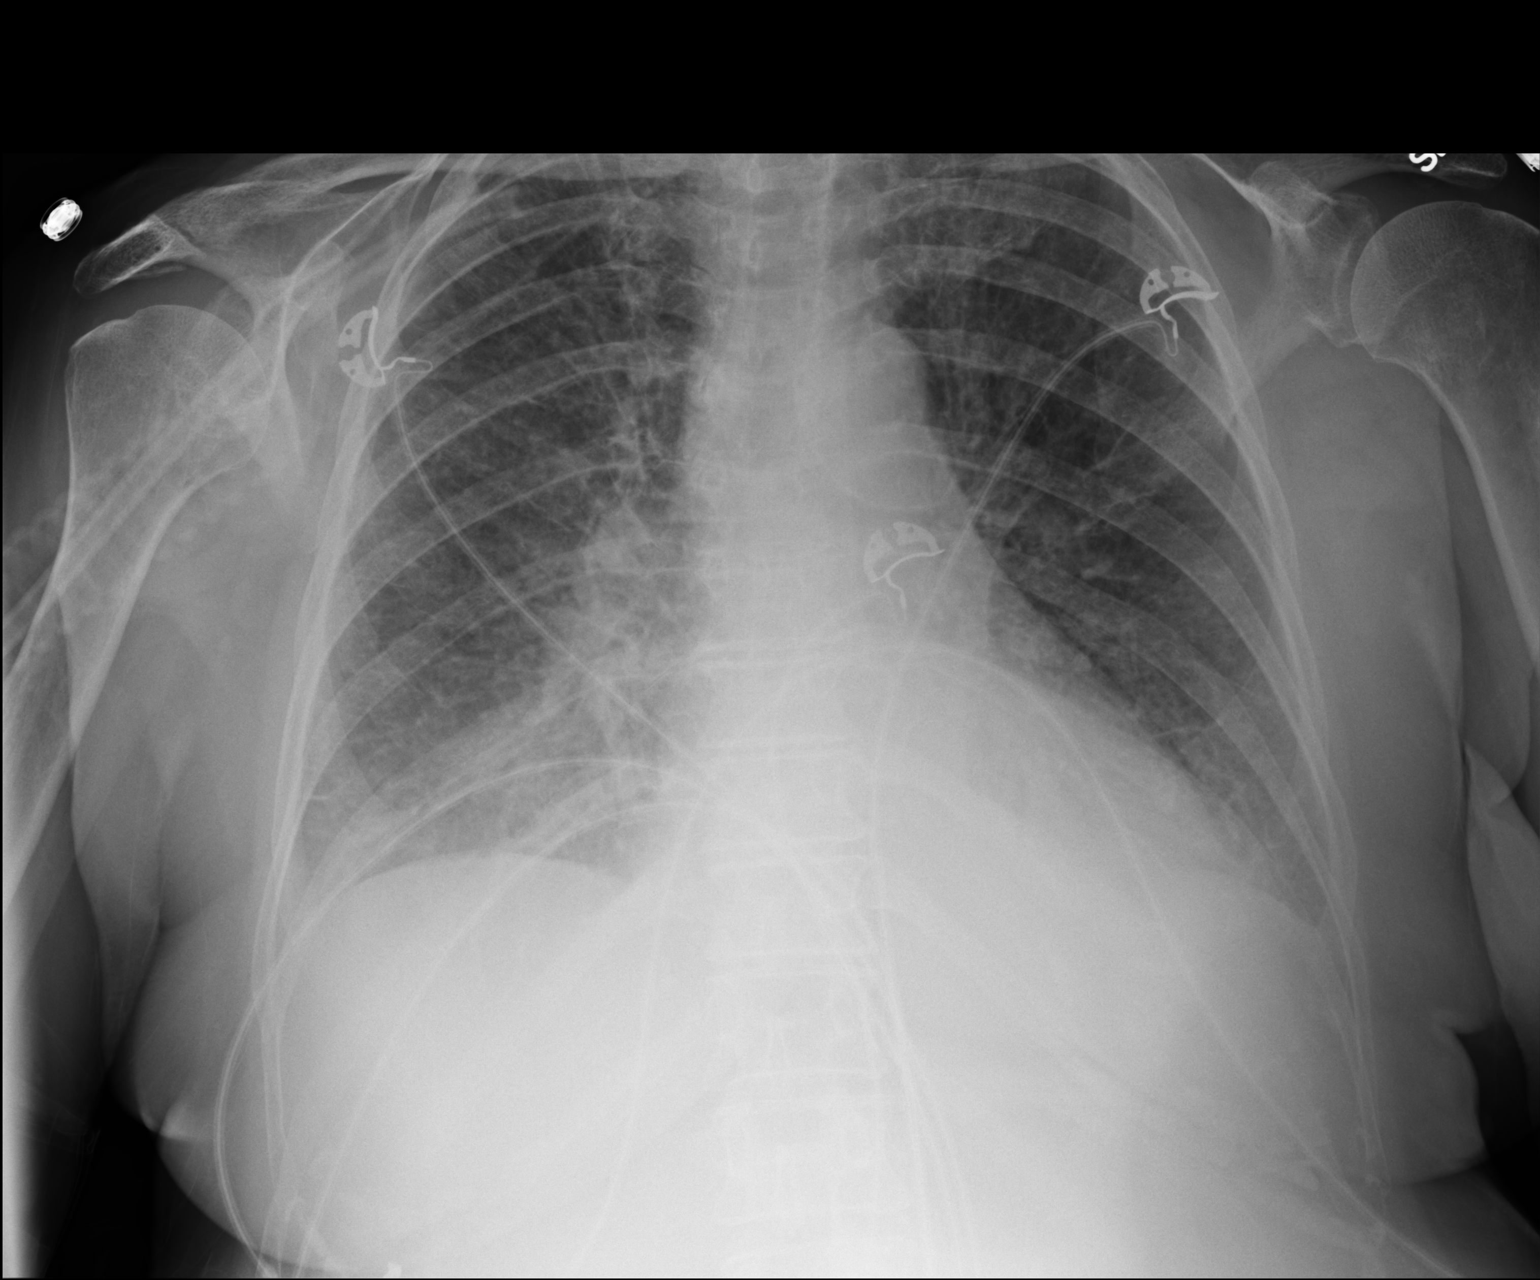

[1 of 1 positions shown; findings below may reference images not displayed]

FINDINGS: Normal sized heart. The pulmonary vasculature and interstitial
markings remain prominent. Small bilateral pleural effusions without
significant change. Increased left lower lobe airspace opacity and
mild patchy opacity at the right lung base. Mild thoracic spine
degenerative changes.
IMPRESSION: 1. Interval bibasilar airspace opacity. This could represent
atelectasis, pneumonia or alveolar edema.
2. Otherwise, stable changes of congestive heart failure.

## 2016-03-25 IMAGING — CR DG CHEST 1V PORT
1 series · 1 of 1 positions shown · non-contrast
Comparison: 04/02/2015

CLINICAL DATA: CHF.  Low blood pressure.

EXAM:
PORTABLE CHEST - 1 VIEW

[AP]
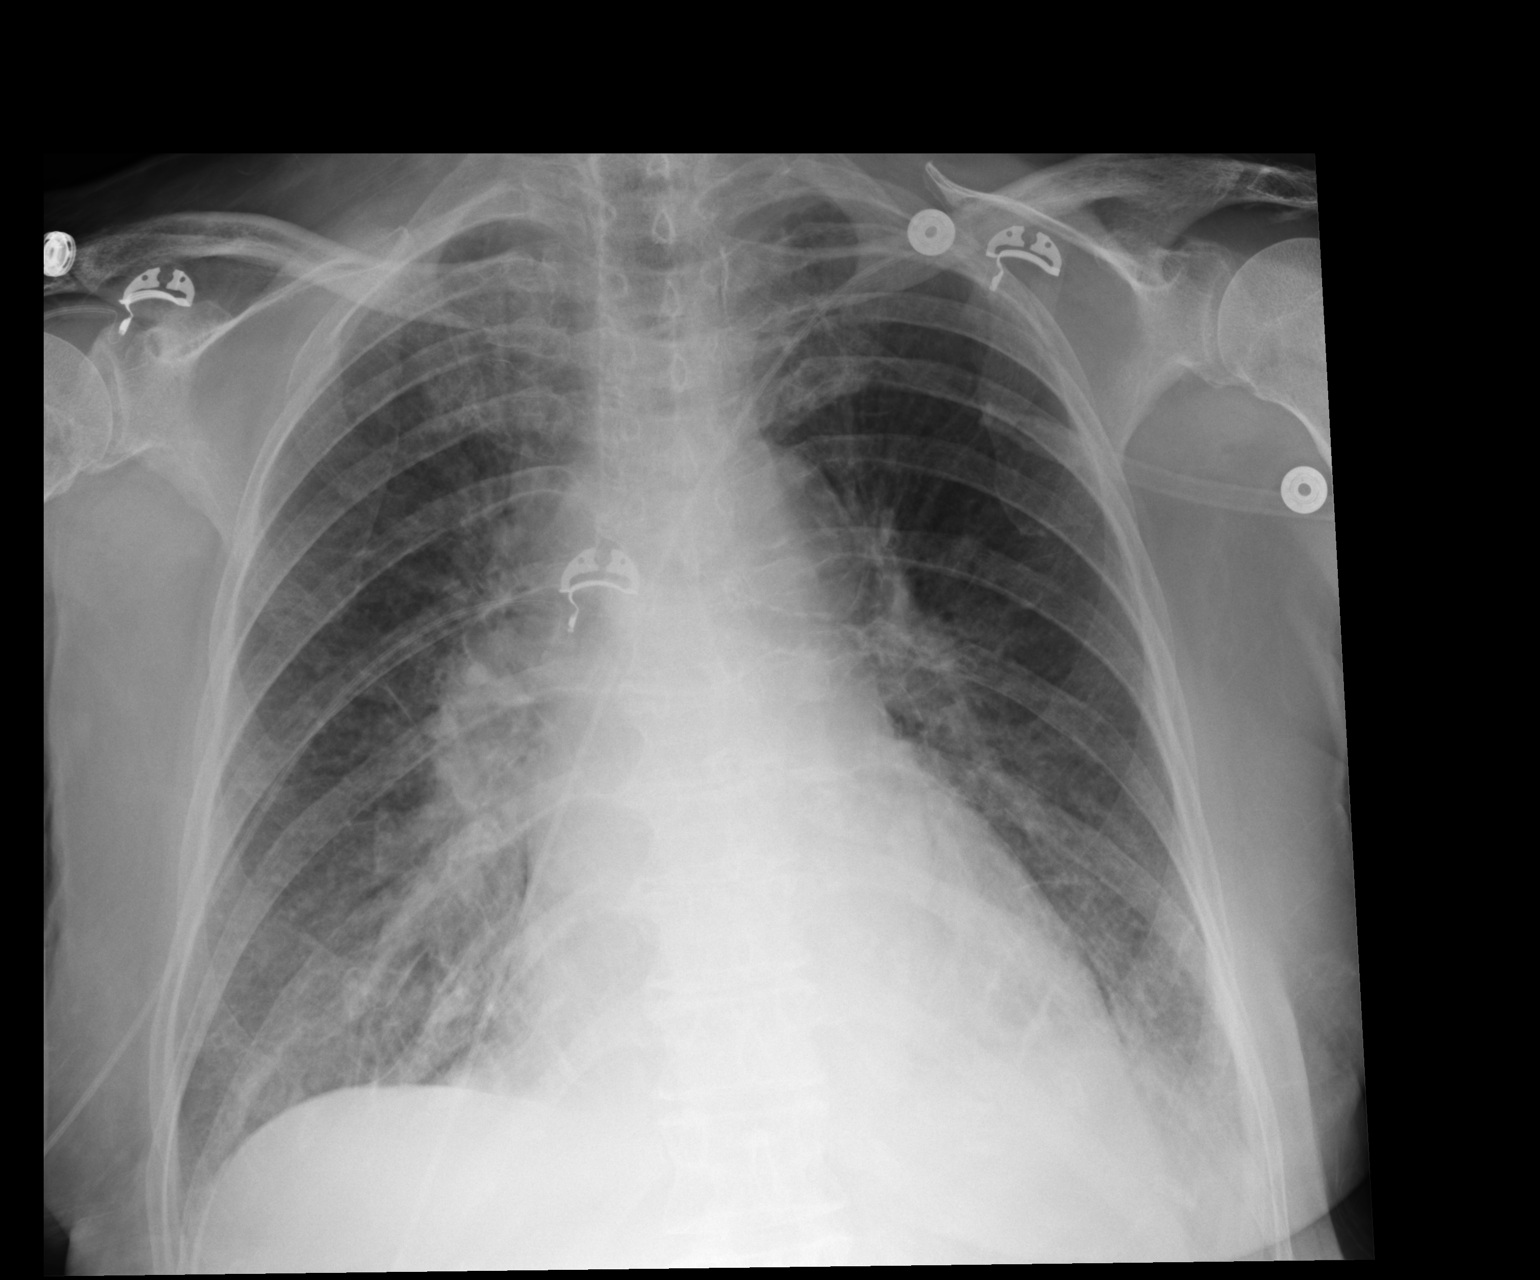

[1 of 1 positions shown; findings below may reference images not displayed]

FINDINGS: Lungs are hyperinflated. The heart is enlarged. There are perihilar
changes of pulmonary edema. More confluent opacity at the left base
may represent edema or infectious process. Suspect left pleural
effusion.
IMPRESSION: 1. Changes consistent with pulmonary edema.
2. Left lower lobe infiltrate or confluent edema.

## 2016-03-26 IMAGING — CR DG ABDOMEN 1V
1 series · 1 of 1 positions shown · non-contrast
Comparison: 06/10/2015

CLINICAL DATA: OG tube placement

EXAM:
ABDOMEN - 1 VIEW

[AP]
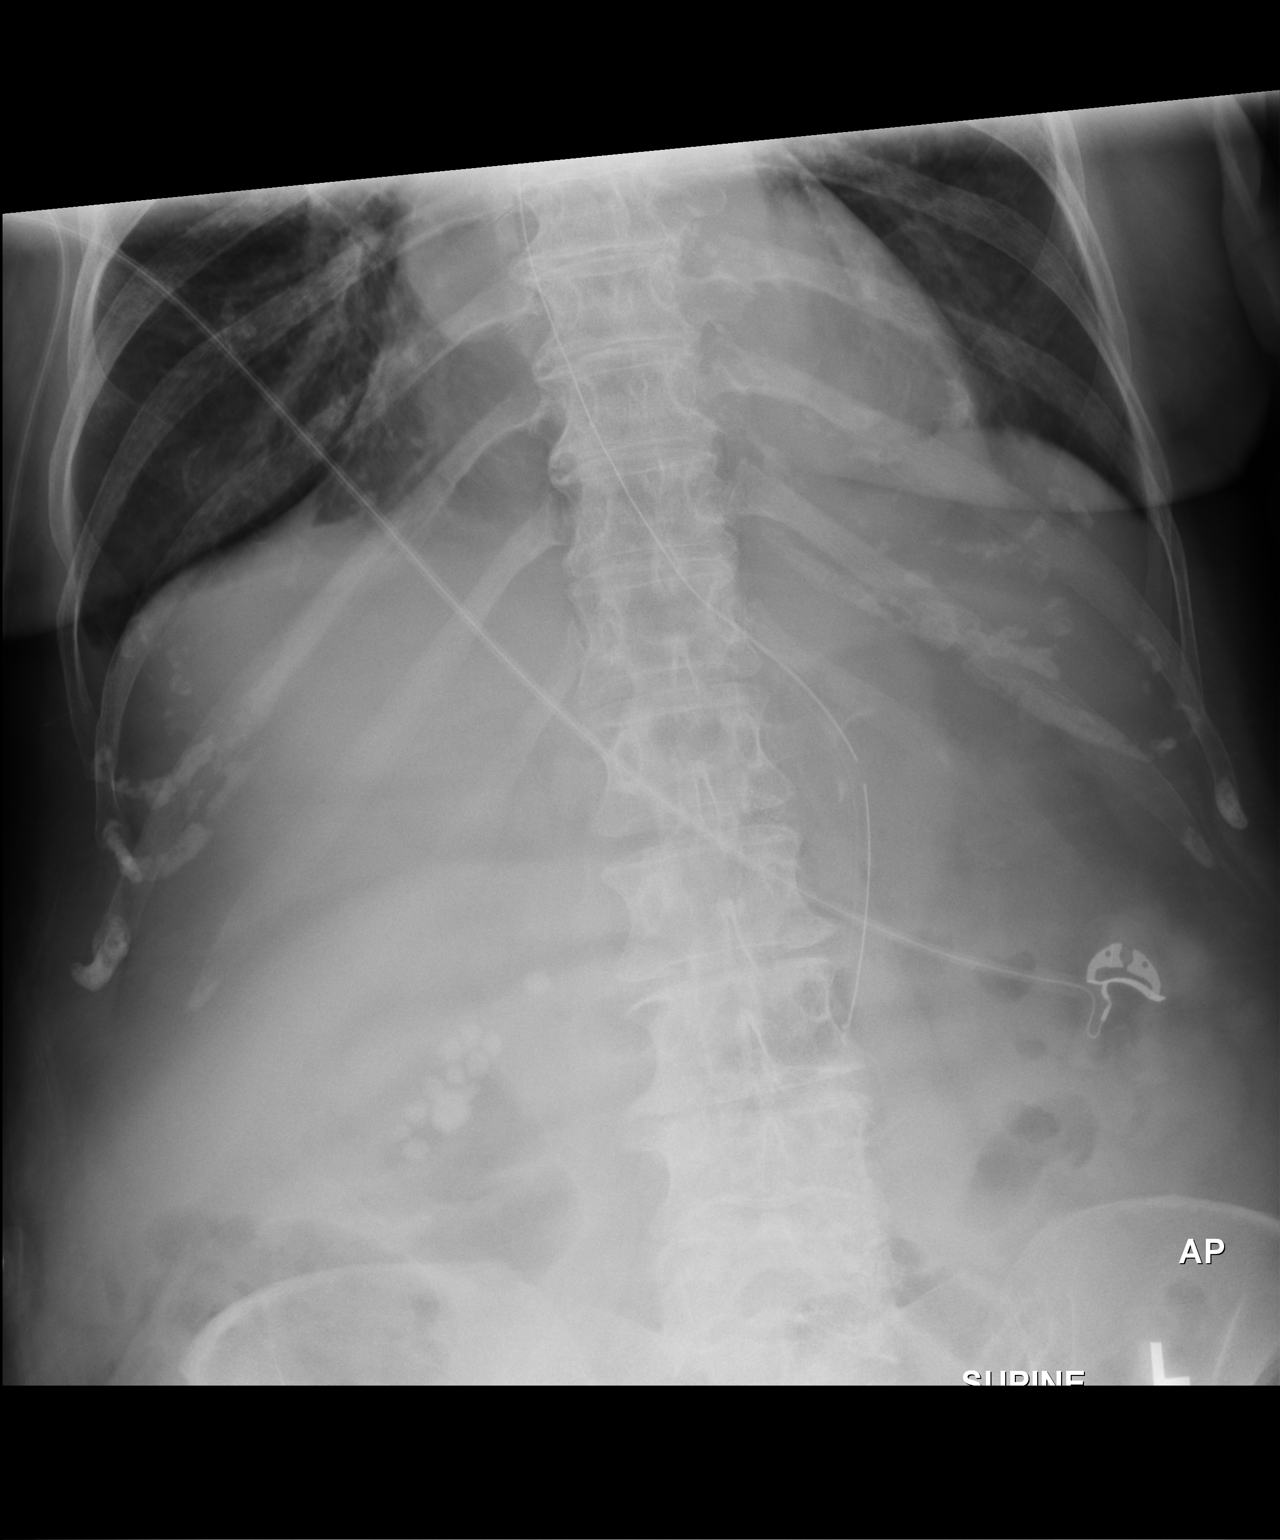

[1 of 1 positions shown; findings below may reference images not displayed]

FINDINGS: OG tube tip is in the mid stomach. Gallstones noted within the
gallbladder. Nonobstructive bowel gas pattern.
IMPRESSION: OG tube tip in the mid stomach.

## 2016-03-26 IMAGING — CR DG CHEST 1V PORT
1 series · 1 of 1 positions shown · non-contrast
Comparison: Chest radiograph performed 06/09/2015

CLINICAL DATA: Endotracheal tube placement.  Initial encounter.

EXAM:
PORTABLE CHEST - 1 VIEW

[AP]
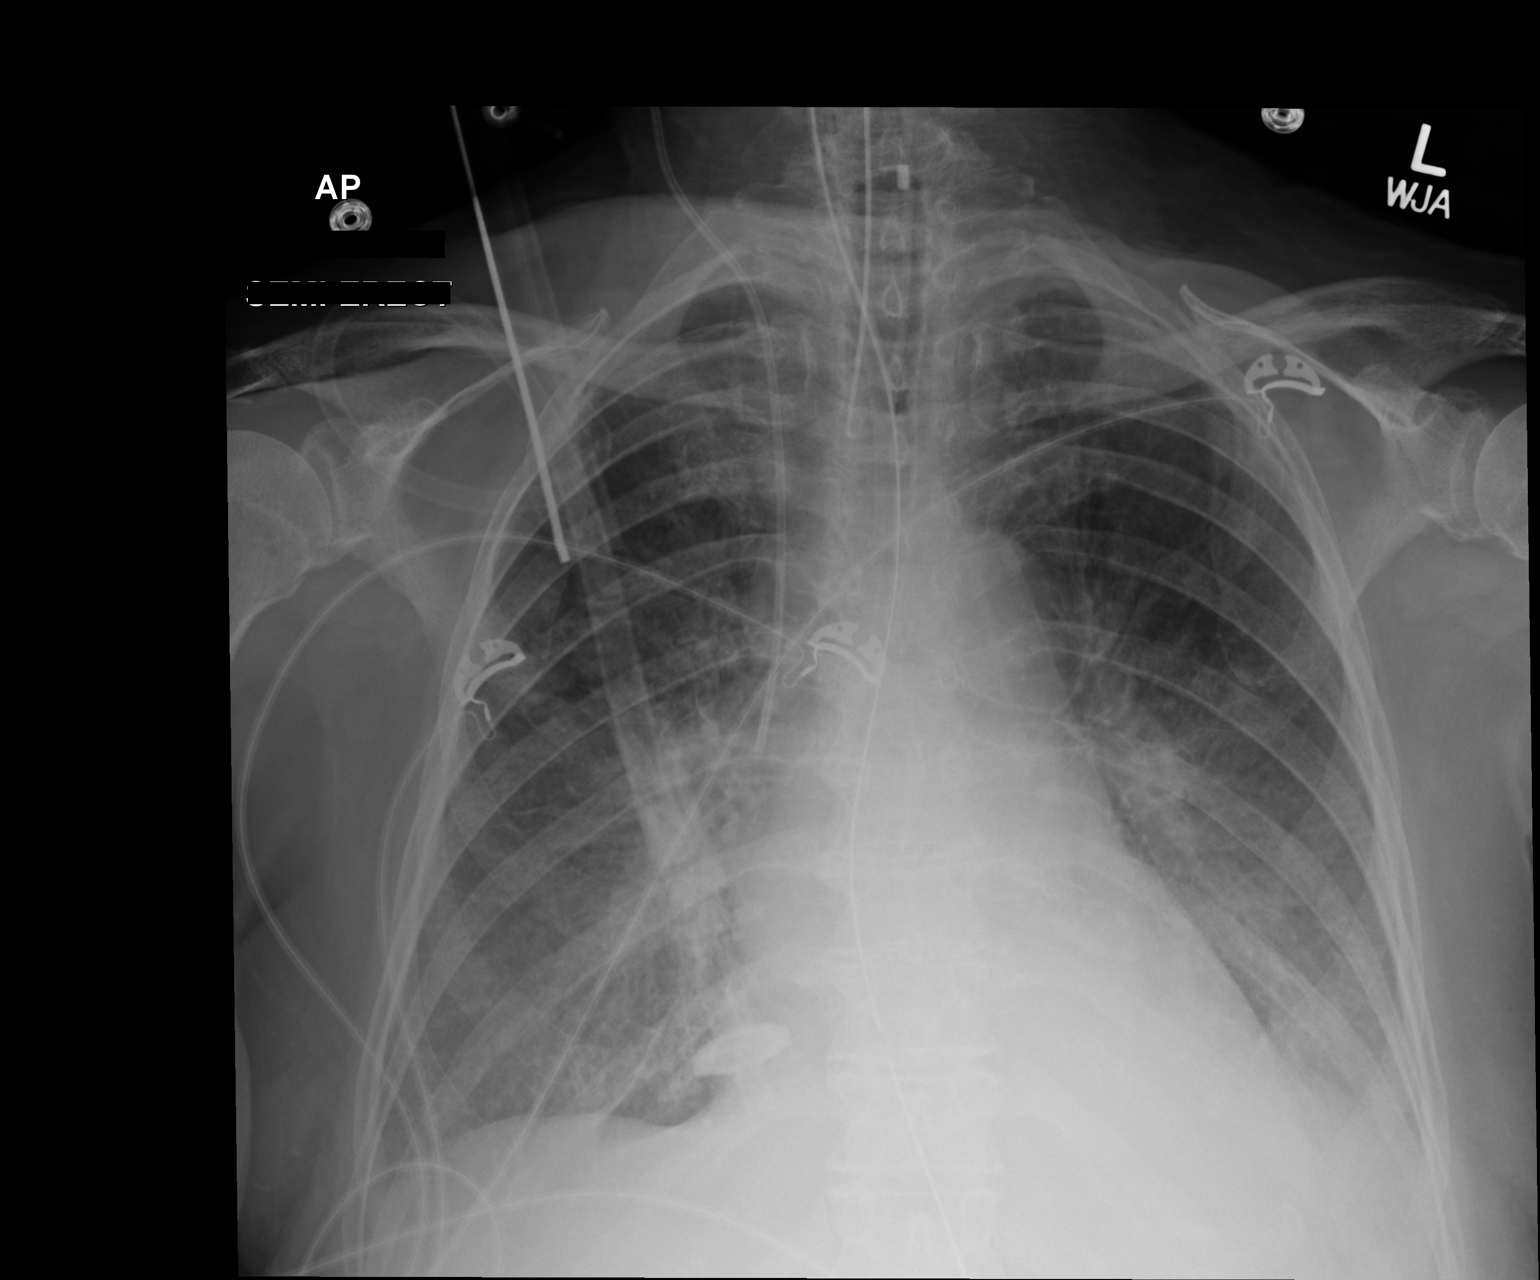

[1 of 1 positions shown; findings below may reference images not displayed]

FINDINGS: The patient's endotracheal tube is seen ending 5 cm above the
carina. An enteric tube is noted extending below the diaphragm. A
right IJ line is noted ending about the mid SVC.

Vascular congestion is noted. Left basilar airspace opacity is
noted. Increased interstitial markings raise concern for mild
pulmonary edema. A small left pleural effusion is suspected. No
pneumothorax is identified.

The cardiomediastinal silhouette is normal in size. No acute osseous
abnormalities are identified.
IMPRESSION: 1. Endotracheal tube seen ending 5 cm above the carina.
2. Vascular congestion noted. Increased interstitial markings raise
concern for mild pulmonary edema. Small left pleural effusion
suspected.

## 2019-04-21 ENCOUNTER — Other Ambulatory Visit: Payer: Self-pay
